# Patient Record
Sex: Female | Born: 1935
Health system: Southern US, Community
[De-identification: ages and names within clinical notes are randomized; demographics above are authoritative.]

## PROBLEM LIST (undated history)

## (undated) DIAGNOSIS — K219 Gastro-esophageal reflux disease without esophagitis: Secondary | ICD-10-CM

## (undated) DIAGNOSIS — I10 Essential (primary) hypertension: Secondary | ICD-10-CM

## (undated) DIAGNOSIS — Z8719 Personal history of other diseases of the digestive system: Secondary | ICD-10-CM

## (undated) DIAGNOSIS — D649 Anemia, unspecified: Secondary | ICD-10-CM

## (undated) DIAGNOSIS — M1990 Unspecified osteoarthritis, unspecified site: Secondary | ICD-10-CM

## (undated) DIAGNOSIS — Z8669 Personal history of other diseases of the nervous system and sense organs: Secondary | ICD-10-CM

## (undated) DIAGNOSIS — C539 Malignant neoplasm of cervix uteri, unspecified: Secondary | ICD-10-CM

## (undated) DIAGNOSIS — I499 Cardiac arrhythmia, unspecified: Secondary | ICD-10-CM

## (undated) DIAGNOSIS — H3530 Unspecified macular degeneration: Secondary | ICD-10-CM

## (undated) DIAGNOSIS — N200 Calculus of kidney: Secondary | ICD-10-CM

## (undated) DIAGNOSIS — E079 Disorder of thyroid, unspecified: Secondary | ICD-10-CM

## (undated) DIAGNOSIS — Z87442 Personal history of urinary calculi: Secondary | ICD-10-CM

## (undated) DIAGNOSIS — N63 Unspecified lump in unspecified breast: Secondary | ICD-10-CM

## (undated) HISTORY — PX: ABDOMINAL HYSTERECTOMY: SHX81

## (undated) HISTORY — DX: Gastro-esophageal reflux disease without esophagitis: K21.9

## (undated) HISTORY — DX: Unspecified lump in unspecified breast: N63.0

## (undated) HISTORY — PX: CHOLECYSTECTOMY: SHX55

## (undated) HISTORY — DX: Disorder of thyroid, unspecified: E07.9

## (undated) HISTORY — DX: Calculus of kidney: N20.0

## (undated) HISTORY — DX: Cardiac arrhythmia, unspecified: I49.9

## (undated) HISTORY — DX: Essential (primary) hypertension: I10

## (undated) HISTORY — PX: SUPRAVENTRICULAR TACHYCARDIA ABLATION: SHX6106

---

## 1997-06-26 DIAGNOSIS — C4492 Squamous cell carcinoma of skin, unspecified: Secondary | ICD-10-CM

## 1997-06-26 HISTORY — DX: Squamous cell carcinoma of skin, unspecified: C44.92

## 1999-03-24 ENCOUNTER — Ambulatory Visit (HOSPITAL_COMMUNITY): Admission: RE | Admit: 1999-03-24 | Discharge: 1999-03-24 | Payer: Self-pay | Admitting: Gastroenterology

## 1999-11-03 ENCOUNTER — Other Ambulatory Visit: Admission: RE | Admit: 1999-11-03 | Discharge: 1999-11-03 | Payer: Self-pay | Admitting: Gynecology

## 2002-01-06 ENCOUNTER — Ambulatory Visit (HOSPITAL_COMMUNITY): Admission: RE | Admit: 2002-01-06 | Discharge: 2002-01-06 | Payer: Self-pay | Admitting: Cardiology

## 2002-01-06 ENCOUNTER — Encounter: Payer: Self-pay | Admitting: Cardiology

## 2003-01-05 ENCOUNTER — Other Ambulatory Visit: Admission: RE | Admit: 2003-01-05 | Discharge: 2003-01-05 | Payer: Self-pay | Admitting: Gynecology

## 2004-03-31 ENCOUNTER — Ambulatory Visit (HOSPITAL_COMMUNITY): Admission: RE | Admit: 2004-03-31 | Discharge: 2004-03-31 | Payer: Self-pay | Admitting: Gastroenterology

## 2008-02-19 HISTORY — PX: NM MYOCAR PERF WALL MOTION: HXRAD629

## 2009-05-19 ENCOUNTER — Encounter: Admission: RE | Admit: 2009-05-19 | Discharge: 2009-05-19 | Payer: Self-pay | Admitting: Endocrinology

## 2010-05-23 ENCOUNTER — Encounter: Admission: RE | Admit: 2010-05-23 | Discharge: 2010-05-23 | Payer: Self-pay | Admitting: Endocrinology

## 2010-11-11 NOTE — Op Note (Signed)
NAMEKORRYN, PANCOAST                ACCOUNT NO.:  [id number]   MEDICAL RECORD NO.:  [id number]          PATIENT TYPE:  AMB   LOCATION:  ENDO                         FACILITY:  [facility]   PHYSICIAN:  James L. Malon Kindle., M.D.DATE OF BIRTH:  10/10/1926   DATE OF PROCEDURE:  03/31/2004  DATE OF DISCHARGE:                                 OPERATIVE REPORT   PROCEDURE:  Colonoscopy.   MEDICATIONS:  Fentanyl 70 mcg, Versed 6 mg IV.   INSTRUMENT USED:  Olympus pediatric adjustable colonoscope.   INDICATIONS FOR PROCEDURE:  Previous family history of colon cancer with  brother having had colon cancer.  This is done as a five-year follow-up  procedure.   DESCRIPTION OF PROCEDURE:  The procedure had been explained to the patient  and consent obtained.  With the patient in the left lateral decubitus  position, the pediatric adjustable scope was inserted and advanced.  The  prep was adequate.  We were able to advance to the cecum using abdominal  pressure and position changes.  The ileocecal valve and appendiceal orifice  were seen.  The scope was withdrawn.  The cecum, ascending colon, transverse  colon, descending and sigmoid colon were seen well.  No polyps were seen  throughout.  Scattered diverticuli in the sigmoid colon.  The rectum was  free of polyps.  The scope was withdrawn.  The patient tolerated the  procedure well and was maintained on low flow oxygen and pulse oximetry  throughout the procedure.   ASSESSMENT:  Family history of colon cancer.  V16.0   PLAN:  Will recommend yearly hemoccults and repeat colonoscopy in five  years.       JLE/MEDQ  D:  03/31/2004  T:  03/31/2004  Job:  [id number]   cc:   Veverly Fells. Altheimer, M.D.  [address], [address]  [place]  [place] [address]  Fax: [phone]

## 2011-02-04 ENCOUNTER — Emergency Department (HOSPITAL_COMMUNITY)
Admission: EM | Admit: 2011-02-04 | Discharge: 2011-02-05 | Disposition: A | Discharge status: Home / Self Care | Payer: Medicare Other | Attending: Emergency Medicine | Admitting: Emergency Medicine

## 2011-02-04 ENCOUNTER — Emergency Department (HOSPITAL_COMMUNITY): Payer: Medicare Other

## 2011-02-04 DIAGNOSIS — Z90710 Acquired absence of both cervix and uterus: Secondary | ICD-10-CM | POA: Insufficient documentation

## 2011-02-04 DIAGNOSIS — K7689 Other specified diseases of liver: Secondary | ICD-10-CM | POA: Insufficient documentation

## 2011-02-04 DIAGNOSIS — N63 Unspecified lump in unspecified breast: Secondary | ICD-10-CM | POA: Insufficient documentation

## 2011-02-04 DIAGNOSIS — N201 Calculus of ureter: Secondary | ICD-10-CM | POA: Insufficient documentation

## 2011-02-04 DIAGNOSIS — Z9089 Acquired absence of other organs: Secondary | ICD-10-CM | POA: Insufficient documentation

## 2011-02-04 DIAGNOSIS — R109 Unspecified abdominal pain: Secondary | ICD-10-CM | POA: Insufficient documentation

## 2011-02-04 DIAGNOSIS — I4891 Unspecified atrial fibrillation: Secondary | ICD-10-CM | POA: Insufficient documentation

## 2011-02-04 DIAGNOSIS — E039 Hypothyroidism, unspecified: Secondary | ICD-10-CM | POA: Insufficient documentation

## 2011-02-04 DIAGNOSIS — IMO0002 Reserved for concepts with insufficient information to code with codable children: Secondary | ICD-10-CM | POA: Insufficient documentation

## 2011-02-04 LAB — CBC
HCT: 35.3 % — ABNORMAL LOW (ref 36.0–46.0)
Hemoglobin: 11 g/dL — ABNORMAL LOW (ref 12.0–15.0)
MCH: 23.2 pg — ABNORMAL LOW (ref 26.0–34.0)
MCHC: 31.2 g/dL (ref 30.0–36.0)
MCV: 74.5 fL — ABNORMAL LOW (ref 78.0–100.0)
Platelets: 277 10*3/uL (ref 150–400)
RBC: 4.74 MIL/uL (ref 3.87–5.11)
RDW: 15.1 % (ref 11.5–15.5)
WBC: 12.7 10*3/uL — ABNORMAL HIGH (ref 4.0–10.5)

## 2011-02-04 LAB — DIFFERENTIAL
Basophils Absolute: 0 10*3/uL (ref 0.0–0.1)
Basophils Relative: 0 % (ref 0–1)
Eosinophils Absolute: 0.1 10*3/uL (ref 0.0–0.7)
Eosinophils Relative: 1 % (ref 0–5)
Lymphocytes Relative: 13 % (ref 12–46)
Lymphs Abs: 1.7 10*3/uL (ref 0.7–4.0)
Monocytes Absolute: 0.8 10*3/uL (ref 0.1–1.0)
Monocytes Relative: 6 % (ref 3–12)
Neutro Abs: 10.1 10*3/uL — ABNORMAL HIGH (ref 1.7–7.7)
Neutrophils Relative %: 80 % — ABNORMAL HIGH (ref 43–77)

## 2011-02-04 LAB — URINE MICROSCOPIC-ADD ON

## 2011-02-04 LAB — BASIC METABOLIC PANEL
BUN: 12 mg/dL (ref 6–23)
CO2: 26 mEq/L (ref 19–32)
Calcium: 9.2 mg/dL (ref 8.4–10.5)
Chloride: 103 mEq/L (ref 96–112)
Creatinine, Ser: 0.9 mg/dL (ref 0.50–1.10)
GFR calc Af Amer: >60 mL/min (ref >60)
GFR calc non Af Amer: >60 mL/min (ref >60)
Glucose, Bld: 148 mg/dL — ABNORMAL HIGH (ref 70–99)
Potassium: 4 mEq/L (ref 3.5–5.1)
Sodium: 139 mEq/L (ref 135–145)

## 2011-02-04 LAB — URINALYSIS, ROUTINE W REFLEX MICROSCOPIC
Bilirubin Urine: NEGATIVE
Glucose, UA: NEGATIVE mg/dL
Leukocytes, UA: NEGATIVE
Nitrite: NEGATIVE
Protein, ur: NEGATIVE mg/dL
Specific Gravity, Urine: 1.021 (ref 1.005–1.030)
Urobilinogen, UA: 0.2 mg/dL (ref 0.0–1.0)
pH: 6 (ref 5.0–8.0)

## 2011-02-04 LAB — LACTIC ACID, PLASMA: Lactic Acid, Venous: 0.9 mmol/L (ref 0.5–2.2)

## 2011-02-04 MED ORDER — IOHEXOL 300 MG/ML  SOLN
100.0000 mL | Freq: Once | INTRAMUSCULAR | Status: AC | PRN
  Administered 2011-02-04: 100 mL via INTRAVENOUS

## 2011-06-27 DIAGNOSIS — C4492 Squamous cell carcinoma of skin, unspecified: Secondary | ICD-10-CM

## 2011-06-27 DIAGNOSIS — I10 Essential (primary) hypertension: Secondary | ICD-10-CM

## 2011-06-27 HISTORY — DX: Squamous cell carcinoma of skin, unspecified: C44.92

## 2011-06-27 HISTORY — DX: Essential (primary) hypertension: I10

## 2012-02-06 DIAGNOSIS — I499 Cardiac arrhythmia, unspecified: Secondary | ICD-10-CM

## 2012-02-06 HISTORY — DX: Cardiac arrhythmia, unspecified: I49.9

## 2012-10-14 ENCOUNTER — Encounter (INDEPENDENT_AMBULATORY_CARE_PROVIDER_SITE_OTHER): Payer: Self-pay | Admitting: Surgery

## 2012-10-21 ENCOUNTER — Encounter (INDEPENDENT_AMBULATORY_CARE_PROVIDER_SITE_OTHER): Payer: Self-pay | Admitting: Surgery

## 2012-10-21 ENCOUNTER — Other Ambulatory Visit (INDEPENDENT_AMBULATORY_CARE_PROVIDER_SITE_OTHER): Payer: Self-pay | Admitting: Surgery

## 2012-10-21 ENCOUNTER — Ambulatory Visit (INDEPENDENT_AMBULATORY_CARE_PROVIDER_SITE_OTHER): Payer: Medicare Other | Admitting: Surgery

## 2012-10-21 VITALS — BP 129/84 | HR 80 | Temp 97.6°F | Resp 16 | Ht 62.0 in | Wt 119.2 lb

## 2012-10-21 DIAGNOSIS — N6310 Unspecified lump in the right breast, unspecified quadrant: Secondary | ICD-10-CM

## 2012-10-21 DIAGNOSIS — N63 Unspecified lump in unspecified breast: Secondary | ICD-10-CM

## 2012-10-21 NOTE — Progress Notes (Signed)
Patient ID: Jill Mcdowell, female   DOB: Feb 22, 1927, 79 y.o.   MRN: [id number]  Chief Complaint  Patient presents with  . Breast Problem   PCP - Dr. Leslie Dales Cardiology Dr. Royann Shivers HPI Jill Mcdowell is a 79 y.o. female.  Referred by Dr. Greta Doom at [facility] for evaluation of large right breast mass HPI This is an 79 year old female who presents with at least a two-year history of a palpable mass behind her right nipple. She has had annual mammograms for the last several years. Her last mammogram was in August of 2013. This noted that the mass in the right retroareolar region had not increased in prominence. Biopsies were never performed of this area.  The mass has gradually enlarged until now it is about 4-5 cm across. Several weeks ago this became very tender and painful to touch. There is no erythema noted. No drainage from the nipples. No palpable lymphadenopathy.  Past Medical History  Diagnosis Date  . Breast lump   . Thyroid disease   . GERD (gastroesophageal reflux disease)   . CHF (congestive heart failure)     Past Surgical History  Procedure Laterality Date  . Abdominal hysterectomy    . Coronary artery bypass graft    . Supraventricular tachycardia ablation      No family history on file.  Social History History  Substance Use Topics  . Smoking status: Never Smoker   . Smokeless tobacco: Not on file  . Alcohol Use: No    Allergies  Allergen Reactions  . Sulfa Antibiotics Hives    Current Outpatient Prescriptions  Medication Sig Dispense Refill  . CHLORPHENIRAMINE-DM PO Take by mouth.      . ciprofloxacin (CIPRO) 250 MG tablet Take 250 mg by mouth 2 (two) times daily.      . digoxin (LANOXIN) 0.125 MG tablet Take 0.125 mg by mouth daily.      [name] diltiazem (DILACOR XR) 180 MG 24 hr capsule Take 180 mg by mouth daily.      . Hydrocodone-Chlorpheniramine (CHLORPHENIRAMINE-HYDROCODONE PO) Take by mouth.      . iron polysaccharides (NIFEREX) 150 MG  capsule Take 150 mg by mouth 2 (two) times daily.      [name] levothyroxine (SYNTHROID, LEVOTHROID) 50 MCG tablet Take 50 mcg by mouth daily before breakfast.      . omeprazole (PRILOSEC) 20 MG capsule Take 20 mg by mouth daily.       No current facility-administered medications for this visit.    Review of Systems Review of Systems  Constitutional: Negative for fever, chills and unexpected weight change.  HENT: Negative for hearing loss, congestion, sore throat, trouble swallowing and voice change.   Eyes: Negative for visual disturbance.  Respiratory: Negative for cough and wheezing.   Cardiovascular: Negative for chest pain, palpitations and leg swelling.  Gastrointestinal: Negative for nausea, vomiting, abdominal pain, diarrhea, constipation, blood in stool, abdominal distention and anal bleeding.  Genitourinary: Negative for hematuria, vaginal bleeding and difficulty urinating.  Musculoskeletal: Negative for arthralgias.  Skin: Negative for rash and wound.  Neurological: Negative for seizures, syncope and headaches.  Hematological: Negative for adenopathy. Does not bruise/bleed easily.  Psychiatric/Behavioral: Negative for confusion.    Blood pressure 129/84, pulse 80, temperature 97.6 F (36.4 C), temperature source Temporal, resp. rate 16, height 5\' 2"  (1.575 m), weight 119 lb 3.2 oz (54.069 kg).  Physical Exam Physical Exam Thin elderly female in NAD HEENT:  EOMI, sclera anicteric Neck:  No masses, no  thyromegaly Lungs:  CTA bilaterally; normal respiratory effort Breasts - no palpable masses in the left breast; no axillary lymphadenopathy on the left Right breast - large 4-5 cm firm retroareolar breast mass - protruding; does not appear fixed to the chest wall; no axillary lymphadenopathy CV:  Regular rate and rhythm; no murmurs Abd:  +bowel sounds, soft, non-tender, no masses Ext:  Well-perfused; no edema Skin:  Warm, dry; no sign of jaundice  Data Reviewed Mammogram  02/21/12 from [facility] - Stable BIRADS 2  Assessment    Breast mass is highly worrisome for invasive carcinoma.       Plan    Immediate ultrasound and core needle biopsy of the mass for diagnosis.  May require adjuvant therapy with hormonal therapy vs. Mastectomy.  We will also obtain cardiac clearance in the event that she needs surgery.  We will see her back in 4 days to discuss the findings of the biopsy and to plan her treatment.         Janki Dike K. 10/21/2012, 5:00 PM

## 2012-10-22 ENCOUNTER — Encounter (INDEPENDENT_AMBULATORY_CARE_PROVIDER_SITE_OTHER): Payer: Self-pay | Admitting: General Surgery

## 2012-10-23 ENCOUNTER — Telehealth (INDEPENDENT_AMBULATORY_CARE_PROVIDER_SITE_OTHER): Payer: Self-pay | Admitting: General Surgery

## 2012-10-23 NOTE — Telephone Encounter (Signed)
Called patient to let her know about her cytopathology report was normal that it showed only a cystic content, no malignant cells. And I also called and canceled her upcoming apt with Dr Rennis Golden. Patient is aware that she needed to keep her apt with Dr Corliss Skains on 10-25-12

## 2012-10-25 ENCOUNTER — Ambulatory Visit (INDEPENDENT_AMBULATORY_CARE_PROVIDER_SITE_OTHER): Payer: Medicare Other | Admitting: Surgery

## 2012-10-25 ENCOUNTER — Encounter (INDEPENDENT_AMBULATORY_CARE_PROVIDER_SITE_OTHER): Payer: Self-pay | Admitting: Surgery

## 2012-10-25 VITALS — BP 122/64 | HR 60 | Temp 97.8°F | Resp 18 | Ht 62.0 in | Wt 120.0 lb

## 2012-10-25 DIAGNOSIS — N6009 Solitary cyst of unspecified breast: Secondary | ICD-10-CM | POA: Insufficient documentation

## 2012-10-25 DIAGNOSIS — N6001 Solitary cyst of right breast: Secondary | ICD-10-CM

## 2012-10-25 NOTE — Progress Notes (Signed)
The patient is seen after her recent diagnostic mammograms/ ultrasound/ cyst aspiration.  We were pleasantly surprised to find that the hard painful mass was a large complex cyst.  This was aspirated with immediate resolution of her symptoms.  Cytology was negative for malignancy.  The question remain as to why she suddenly had an enlargement of the mass.  She has a follow-up ultrasound in October.  The breast mass is essentially resolved.  The aspiration site is healing well.  No breast tenderness.  Repeat ultrasound October. Follow-up PRN.  Jill Mcdowell. Jill Skains, MD, [facility]  10/25/2012 5:06 PM

## 2012-11-04 ENCOUNTER — Encounter (INDEPENDENT_AMBULATORY_CARE_PROVIDER_SITE_OTHER): Payer: Self-pay

## 2012-11-08 ENCOUNTER — Encounter (INDEPENDENT_AMBULATORY_CARE_PROVIDER_SITE_OTHER): Payer: Medicare Other | Admitting: Surgery

## 2012-11-22 ENCOUNTER — Encounter (INDEPENDENT_AMBULATORY_CARE_PROVIDER_SITE_OTHER): Payer: Self-pay

## 2013-01-23 ENCOUNTER — Other Ambulatory Visit: Payer: Self-pay | Admitting: *Deleted

## 2013-01-23 MED ORDER — DILTIAZEM HCL ER 180 MG PO CP24: 180.0000 mg | ORAL_CAPSULE | Freq: Every day | ORAL | Status: DC

## 2013-01-23 MED ORDER — DIGOXIN 125 MCG PO TABS: 0.1250 mg | ORAL_TABLET | Freq: Every day | ORAL | Status: DC

## 2013-01-23 NOTE — Telephone Encounter (Signed)
Rx was sent to pharmacy electronically. 

## 2013-02-20 ENCOUNTER — Encounter: Payer: Self-pay | Admitting: *Deleted

## 2013-02-21 ENCOUNTER — Encounter: Payer: Self-pay | Admitting: Cardiovascular Disease

## 2013-02-26 ENCOUNTER — Encounter: Payer: Self-pay | Admitting: Cardiovascular Disease

## 2013-02-26 ENCOUNTER — Ambulatory Visit (INDEPENDENT_AMBULATORY_CARE_PROVIDER_SITE_OTHER): Payer: Medicare Other | Admitting: Cardiovascular Disease

## 2013-02-26 VITALS — BP 120/56 | HR 74 | Resp 16 | Ht 62.0 in | Wt 119.1 lb

## 2013-02-26 DIAGNOSIS — I4891 Unspecified atrial fibrillation: Secondary | ICD-10-CM

## 2013-02-26 DIAGNOSIS — I499 Cardiac arrhythmia, unspecified: Secondary | ICD-10-CM

## 2013-02-26 DIAGNOSIS — I480 Paroxysmal atrial fibrillation: Secondary | ICD-10-CM

## 2013-02-26 NOTE — Assessment & Plan Note (Signed)
Pelvis shows are brief, fairly infrequent and are minimally symptomatic. In fact atrial fibrillation has not been documented electrocardiographically in years. Antiarrhythmic therapy does not appear to be justified. Her CHADS2Vasc2 score is low, since her only risk factor is her age. Aspirin is satisfactory prevention therapy for embolism. She really does not have hypertension. The diltiazem she takes is primarily indicated for prevention of rapid ventricular rates. No changes are recommended her medications today. She'll continue followup on a yearly basis.

## 2013-02-26 NOTE — Patient Instructions (Addendum)
Your physician recommends that you schedule a follow-up appointment in: 12 months.  

## 2013-02-26 NOTE — Progress Notes (Signed)
Patient ID: Jill Mcdowell, female   DOB: [birth year], 79 y.o.   MRN: [id number]     Reason for office visit Atrial fibrillation, palpitations  This is my first official encounter with Jill Mcdowell, whose previous cardiologist is Dr. Caprice Kluver until his recent retirement. Her husband Jill Mcdowell is also my patient and I have met her before during his office appointments.  Jill Mcdowell is very active and fit and has few medical problems. She had recent breast biopsy that showed benign findings. Many years ago she had an episode of atrial fibrillation. She is now troubled by occasional very brief palpitations with abrupt onset and termination, most commonly when she first gets up in the morning. They are not associated with chest pain, dyspnea or syncope. Many times she has just isolated skipped beats. She denies any other complaints.  She reportedly had mitral valve prolapse identified by echocardiography many years ago. This diagnosis is not evident by physical exam. She had a normal nuclear stress test about 5 years ago.    Allergies  Allergen Reactions  . Latex     Skin irritation  . Sulfa Antibiotics Hives    Current Outpatient Prescriptions  Medication Sig Dispense Refill  . aspirin EC 81 MG tablet Take 81 mg by mouth daily.      . calcium carbonate (TUMS EX) 750 MG chewable tablet Chew 1 tablet by mouth daily.      . cholecalciferol (VITAMIN D) 1000 UNITS tablet Take 1,000 Units by mouth daily.      . digoxin (LANOXIN) 0.125 MG tablet Take 1 tablet (0.125 mg total) by mouth daily.  90 tablet  0  . diltiazem (DILACOR XR) 180 MG 24 hr capsule Take 1 capsule (180 mg total) by mouth daily.  90 capsule  0  . Hydrocodone-Chlorpheniramine (CHLORPHENIRAMINE-HYDROCODONE PO) Take by mouth as needed.       . iron polysaccharides (NIFEREX) 150 MG capsule Take 150 mg by mouth 2 (two) times a week.       . levothyroxine (SYNTHROID, LEVOTHROID) 50 MCG tablet Take 50 mcg by mouth daily before breakfast.      .  Multiple Vitamins-Minerals (PRESERVISION AREDS PO) Take by mouth.      [name] omeprazole (PRILOSEC) 20 MG capsule Take 20 mg by mouth daily.       No current facility-administered medications for this visit.    Past Medical History  Diagnosis Date  . Breast lump   . Thyroid disease   . GERD (gastroesophageal reflux disease)   . CHF (congestive heart failure)   . Arrhythmia 02/06/2012    hx of paroxysmal a fib with no recurrence,rare palpations  . Hypertension 2013    good control  . Kidney stones     history of    Past Surgical History  Procedure Laterality Date  . Abdominal hysterectomy    . Coronary artery bypass graft    . Supraventricular tachycardia ablation    . Nm myocar perf wall motion  Feb 19 2008    negative for ischemia    Family History  Problem Relation Age of Onset  . Cancer Mother   . Stroke Father   . Cancer Brother     History   Social History  . Marital Status: Married    Spouse Name: N/A    Number of Children: N/A  . Years of Education: N/A   Occupational History  . Not on file.   Social History Main Topics  . Smoking status: Never Smoker   .  Smokeless tobacco: Not on file  . Alcohol Use: No  . Drug Use: No  . Sexual Activity: Not on file   Other Topics Concern  . Not on file   Social History Narrative  . No narrative on file    Review of systems: The patient specifically denies any chest pain at rest or with exertion, dyspnea at rest or with exertion, orthopnea, paroxysmal nocturnal dyspnea, syncope, focal neurological deficits, intermittent claudication, lower extremity edema, unexplained weight gain, cough, hemoptysis or wheezing.  The patient also denies abdominal pain, nausea, vomiting, dysphagia, diarrhea, constipation, polyuria, polydipsia, dysuria, hematuria, frequency, urgency, abnormal bleeding or bruising, fever, chills, unexpected weight changes, mood swings, change in skin or hair texture, change in voice quality, auditory or  visual problems, allergic reactions or rashes, new musculoskeletal complaints other than usual "aches and pains".   PHYSICAL EXAM BP 120/56  Pulse 74  Resp 16  Ht 5\' 2"  (1.575 m)  Wt 119 lb 1.6 oz (54.023 kg)  BMI 21.78 kg/m2  General: Alert, oriented x3, no distress Head: no evidence of trauma, PERRL, EOMI, no exophtalmos or lid lag, no myxedema, no xanthelasma; normal ears, nose and oropharynx Neck: normal jugular venous pulsations and no hepatojugular reflux; brisk carotid pulses without delay and no carotid bruits Chest: clear to auscultation, no signs of consolidation by percussion or palpation, normal fremitus, symmetrical and full respiratory excursions Cardiovascular: normal position and quality of the apical impulse, regular rhythm, normal first and second heart sounds, no murmurs, rubs or gallops Abdomen: no tenderness or distention, no masses by palpation, no abnormal pulsatility or arterial bruits, normal bowel sounds, no hepatosplenomegaly Extremities: no clubbing, cyanosis or edema; 2+ radial, ulnar and brachial pulses bilaterally; 2+ right femoral, posterior tibial and dorsalis pedis pulses; 2+ left femoral, posterior tibial and dorsalis pedis pulses; no subclavian or femoral bruits Neurological: grossly nonfocal   EKG: NSR, normal  Lipid Panel  No results found for this basename: chol, trig, hdl, cholhdl, vldl, ldlcalc    BMET    Component Value Date/Time   NA 139 02/04/2011 1903   K 4.0 02/04/2011 1903   CL 103 02/04/2011 1903   CO2 26 02/04/2011 1903   GLUCOSE 148* 02/04/2011 1903   BUN 12 02/04/2011 1903   CREATININE 0.90 02/04/2011 1903   CALCIUM 9.2 02/04/2011 1903   GFRNONAA >60 02/04/2011 1903   GFRAA >60 02/04/2011 1903     ASSESSMENT AND PLAN Paroxysmal atrial fibrillation Pelvis shows are brief, fairly infrequent and are minimally symptomatic. In fact atrial fibrillation has not been documented electrocardiographically in years. Antiarrhythmic therapy does  not appear to be justified. Her CHADS2Vasc2 score is low, since her only risk factor is her age. Aspirin is satisfactory prevention therapy for embolism. She really does not have hypertension. The diltiazem she takes is primarily indicated for prevention of rapid ventricular rates. No changes are recommended her medications today. She'll continue followup on a yearly basis.  Orders Placed This Encounter  Procedures  . EKG 12-Lead   No orders of the defined types were placed in this encounter.    Junious Silk, MD, [facility] [phone] office [phone] pager

## 2013-04-16 ENCOUNTER — Telehealth (INDEPENDENT_AMBULATORY_CARE_PROVIDER_SITE_OTHER): Payer: Self-pay | Admitting: General Surgery

## 2013-04-16 NOTE — Telephone Encounter (Signed)
Faxed back orders to [facility] for patient to have her Right Breast Ultrasound. Dr Corliss Skains sign and will be scan in epic

## 2013-04-29 ENCOUNTER — Encounter (INDEPENDENT_AMBULATORY_CARE_PROVIDER_SITE_OTHER): Payer: Self-pay

## 2013-05-05 ENCOUNTER — Other Ambulatory Visit: Payer: Self-pay

## 2013-05-05 MED ORDER — DIGOXIN 125 MCG PO TABS: 0.1250 mg | ORAL_TABLET | Freq: Every day | ORAL | Status: DC

## 2013-05-05 MED ORDER — DILTIAZEM HCL ER 180 MG PO CP24: 180.0000 mg | ORAL_CAPSULE | Freq: Every day | ORAL | Status: DC

## 2013-05-05 NOTE — Telephone Encounter (Signed)
Rx(s) were sent to pharmacy electronically. 

## 2013-07-08 ENCOUNTER — Encounter: Payer: Self-pay | Admitting: Cardiovascular Disease

## 2013-07-08 ENCOUNTER — Ambulatory Visit (INDEPENDENT_AMBULATORY_CARE_PROVIDER_SITE_OTHER): Payer: Medicare Other | Admitting: Cardiovascular Disease

## 2013-07-08 VITALS — BP 124/60 | HR 70 | Resp 20 | Ht 62.0 in | Wt 117.5 lb

## 2013-07-08 DIAGNOSIS — I4891 Unspecified atrial fibrillation: Secondary | ICD-10-CM

## 2013-07-08 DIAGNOSIS — I480 Paroxysmal atrial fibrillation: Secondary | ICD-10-CM

## 2013-07-08 NOTE — Patient Instructions (Signed)
Your physician recommends that you schedule a follow-up appointment in: 1 year follow up

## 2013-07-11 ENCOUNTER — Encounter: Payer: Self-pay | Admitting: Cardiovascular Disease

## 2013-07-11 NOTE — Progress Notes (Signed)
Patient ID: Jill Mcdowell, female   DOB: 06/04/1927, 80 y.o.   MRN: [id number]      Reason for office visit History of atrial fibrillation.  Jill Mcdowell has done well since her last appointment and has rarely been troubled by palpitations. She remains active and fit. When she does have palpitations they are brief and resolve spontaneously within a few seconds. The exception would be around Christmastime when she thinks she overindulgent chocolate and had fairly frequent skipped beats. It occurred roughly 4 times during a three-week period. They do not associate angina, dyspnea, syncope or other cardiovascular complaints. She feels that they're just isolated skipped beats. She bears a diagnosis of mitral valve prolapse and had a normal nuclear stress test about 5 years ago. She has preserved left ventricular systolic function.   Allergies  Allergen Reactions  . Latex     Skin irritation  . Sulfa Antibiotics Hives    Current Outpatient Prescriptions  Medication Sig Dispense Refill  . aspirin EC 81 MG tablet Take 81 mg by mouth daily.      . calcium carbonate (TUMS EX) 750 MG chewable tablet Chew 1 tablet by mouth daily.      . cholecalciferol (VITAMIN D) 1000 UNITS tablet Take 1,000 Units by mouth daily.      . digoxin (LANOXIN) 0.125 MG tablet Take 1 tablet (0.125 mg total) by mouth daily.  90 tablet  3  . diltiazem (DILACOR XR) 180 MG 24 hr capsule Take 1 capsule (180 mg total) by mouth daily.  90 capsule  3  . Hydrocodone-Chlorpheniramine (CHLORPHENIRAMINE-HYDROCODONE PO) Take by mouth as needed.       . iron polysaccharides (NIFEREX) 150 MG capsule Take 150 mg by mouth 2 (two) times a week.       . levothyroxine (SYNTHROID, LEVOTHROID) 50 MCG tablet Take 50 mcg by mouth daily before breakfast.      . Multiple Vitamins-Minerals (PRESERVISION AREDS PO) Take by mouth.      [name] omeprazole (PRILOSEC) 20 MG capsule Take 20 mg by mouth daily.       No current facility-administered medications  for this visit.    Past Medical History  Diagnosis Date  . Breast lump   . Thyroid disease   . GERD (gastroesophageal reflux disease)   . CHF (congestive heart failure)   . Arrhythmia 02/06/2012    hx of paroxysmal a fib with no recurrence,rare palpations  . Hypertension 2013    good control  . Kidney stones     history of    Past Surgical History  Procedure Laterality Date  . Abdominal hysterectomy    . Coronary artery bypass graft    . Supraventricular tachycardia ablation    . Nm myocar perf wall motion  Feb 19 2008    negative for ischemia    Family History  Problem Relation Age of Onset  . Cancer Mother   . Stroke Father   . Cancer Brother     History   Social History  . Marital Status: Married    Spouse Name: N/A    Number of Children: N/A  . Years of Education: N/A   Occupational History  . Not on file.   Social History Main Topics  . Smoking status: Never Smoker   . Smokeless tobacco: Not on file  . Alcohol Use: No  . Drug Use: No  . Sexual Activity: Not on file   Other Topics Concern  . Not on file  Social History Narrative  . No narrative on file    Review of systems: The patient specifically denies any chest pain at rest or with exertion, dyspnea at rest or with exertion, orthopnea, paroxysmal nocturnal dyspnea, syncope, focal neurological deficits, intermittent claudication, lower extremity edema, unexplained weight gain, cough, hemoptysis or wheezing.  The patient also denies abdominal pain, nausea, vomiting, dysphagia, diarrhea, constipation, polyuria, polydipsia, dysuria, hematuria, frequency, urgency, abnormal bleeding or bruising, fever, chills, unexpected weight changes, mood swings, change in skin or hair texture, change in voice quality, auditory or visual problems, allergic reactions or rashes, new musculoskeletal complaints other than usual "aches and pains".   PHYSICAL EXAM BP 124/60  Pulse 70  Resp 20  Ht 5\' 2"  (1.575 m)  Wt  53.298 kg (117 lb 8 oz)  BMI 21.49 kg/m2 General: Alert, oriented x3, no distress  Head: no evidence of trauma, PERRL, EOMI, no exophtalmos or lid lag, no myxedema, no xanthelasma; normal ears, nose and oropharynx  Neck: normal jugular venous pulsations and no hepatojugular reflux; brisk carotid pulses without delay and no carotid bruits  Chest: clear to auscultation, no signs of consolidation by percussion or palpation, normal fremitus, symmetrical and full respiratory excursions  Cardiovascular: normal position and quality of the apical impulse, regular rhythm, normal first and second heart sounds, no murmurs, rubs or gallops  Abdomen: no tenderness or distention, no masses by palpation, no abnormal pulsatility or arterial bruits, normal bowel sounds, no hepatosplenomegaly  Extremities: no clubbing, cyanosis or edema; 2+ radial, ulnar and brachial pulses bilaterally; 2+ right femoral, posterior tibial and dorsalis pedis pulses; 2+ left femoral, posterior tibial and dorsalis pedis pulses; no subclavian or femoral bruits  Neurological: grossly nonfocal      EKG: NSR  BMET    Component Value Date/Time   NA 139 02/04/2011 1903   K 4.0 02/04/2011 1903   CL 103 02/04/2011 1903   CO2 26 02/04/2011 1903   GLUCOSE 148* 02/04/2011 1903   BUN 12 02/04/2011 1903   CREATININE 0.90 02/04/2011 1903   CALCIUM 9.2 02/04/2011 1903   GFRNONAA >60 02/04/2011 1903   GFRAA >60 02/04/2011 1903     ASSESSMENT AND PLAN Paroxysmal atrial fibrillation It has been many years since atrial fibrillation has been firmly documented and her most recent palpitations sound like brief spells of PACs and/or atrial tachycardia lasting only several seconds every time. On the other hand, we cannot be sure that she is not having paroxysmal atrial fibrillation. Her symptoms are relatively mild and have not subsided after the holidays have passed. Antiarrhythmic therapy or change in her rate control medications does not appear to be  indicated.  We spent quite some time discussing the pros and cons of potent anti-coagulation for stroke prevention. Technically with a CHADS2Vasc2 score of 3, she would meet criteria for anti-coagulation. However when taking into account her HAS BLED score, the benefit of anticoagulants for stroke prevention is more than counterbalanced by the increased risk of bleeding. In addition she only receives points on our scoring system because of her age and gender and she does not have any other risk factors. She is biologically younger than her chronological age. Statistically, aspirin appears to offer the best balance between stroke prevention and bleeding risk. She'll take a minimum of 150 mg of aspirin every day. No other changes are made her medications to   Orders Placed This Encounter  Procedures  . EKG 12-Lead   No orders of the defined types were placed in  this encounter.    Holli Humbles, MD, [place] [phone] office [phone] pager

## 2013-07-11 NOTE — Assessment & Plan Note (Signed)
It has been many years since atrial fibrillation has been firmly documented and her most recent palpitations sound like brief spells of PACs and/or atrial tachycardia lasting only several seconds every time. On the other hand, we cannot be sure that she is not having paroxysmal atrial fibrillation. Her symptoms are relatively mild and have not subsided after the holidays have passed. Antiarrhythmic therapy or change in her rate control medications does not appear to be indicated.  We spent quite some time discussing the pros and cons of potent anti-coagulation for stroke prevention. Technically with a CHADS2Vasc2 score of 3, she would meet criteria for anti-coagulation. However when taking into account her HAS BLED score, the benefit of anticoagulants for stroke prevention is more than counterbalanced by the increased risk of bleeding. In addition she only receives points on our scoring system because of her age and gender and she does not have any other risk factors. She is biologically younger than her chronological age. Statistically, aspirin appears to offer the best balance between stroke prevention and bleeding risk. She'll take a minimum of 150 mg of aspirin every day. No other changes are made her medications to

## 2013-07-14 ENCOUNTER — Encounter: Payer: Self-pay | Admitting: Cardiovascular Disease

## 2013-10-10 ENCOUNTER — Encounter: Payer: Medicare Other | Admitting: *Deleted

## 2013-10-10 ENCOUNTER — Ambulatory Visit (INDEPENDENT_AMBULATORY_CARE_PROVIDER_SITE_OTHER): Payer: Medicare Other | Admitting: *Deleted

## 2013-10-10 ENCOUNTER — Telehealth: Payer: Self-pay | Admitting: *Deleted

## 2013-10-10 DIAGNOSIS — R42 Dizziness and giddiness: Secondary | ICD-10-CM

## 2013-10-10 DIAGNOSIS — R55 Syncope and collapse: Secondary | ICD-10-CM

## 2013-10-10 DIAGNOSIS — R002 Palpitations: Secondary | ICD-10-CM

## 2013-10-10 NOTE — Telephone Encounter (Signed)
C/O near sycopal spell Wednesday.  States Dr. Loletha Grayer had talked about an Event Monitor at her last visit.  She is ready to have this done.  Will place today.

## 2013-10-10 NOTE — Patient Instructions (Signed)
Instruction given to Jill Mcdowell  ANY QUESTION CONCERNING CARDIONET -CALL THE [phone] NUMBER  Your physician recommends that you schedule a follow-up appointment in [place].

## 2013-10-10 NOTE — Progress Notes (Signed)
Monitor for 30 days per Dr Bayard Beaver , near syncope  Jill Mcdowell stated that she is allergic to latex , sometimes the electrode leaves a mark on the her skin. RN informed patient that electrode are not latex but not sure if he will have any trouble. RN informed Dr Sallyanne Kuster.  Per Dr Sallyanne Kuster ,if patient does not want to wear the monitor--next step will be an implanted loop recorder. RN informed patient of the options. Jill Mcdowell STATED SHE WOULD LIKE TO TRY TO WEAR THE MONITOR.  Instruction given to patient concerning how to wear and use the monitor.

## 2013-10-10 NOTE — Progress Notes (Signed)
Open in error

## 2013-10-27 ENCOUNTER — Telehealth: Payer: Self-pay | Admitting: *Deleted

## 2013-10-27 NOTE — Telephone Encounter (Signed)
Returned call and pt verified x 2.  Pt stated she has been wearing the monitor for 2.5 weeks the electrodes are breaking her out and her skin is irritated and red.  Stated she has been using the sensitive skin electrodes.  Pt wants to know if she can stop wearing the monitor.  Wanted to know if Dr. Loletha Grayer has enough data.  Pt informed Dr. Loletha Grayer will be notified for further instructions.  Pt was advised to take electrodes off, take otc antihistamine and apply hydrocortisone cream to rash.  Pt verbalized understanding and will wait for response from Dr. Loletha Grayer about dc'ing monitor.  Message forwarded to Dr. Sallyanne Kuster.

## 2013-10-27 NOTE — Telephone Encounter (Signed)
Pt was calling in regards to her heart monitor that she is wearing. She has some questions regarding it.  [place]

## 2013-10-28 ENCOUNTER — Telehealth: Payer: Self-pay | Admitting: *Deleted

## 2013-10-28 NOTE — Telephone Encounter (Signed)
[employer] notified patient will be sending her monitor back early due to skin irritation from electrodes.  She did not have a MCOT due to insurance reasons therefore strips were only recorded and sent if she was symptomatic or episodes met criteria.  They will do an end of summary report and send it via fax.

## 2013-10-28 NOTE — Telephone Encounter (Signed)
Patient notified to disconnect the Event Monitor and mail it back in since she is breaking out so bad from the electrodes. Patient  voiced understanding.

## 2013-10-28 NOTE — Telephone Encounter (Signed)
Message copied by Tressa Busman on Tue Oct 28, 2013 12:53 PM ------      Message from: Sanda Klein      Created: Tue Oct 28, 2013  8:01 AM       Pamala Hurry, can you pull together the strips we have so far, please.Tell her to stop wearing the minitor, send it back.      [facility] ------

## 2013-11-14 ENCOUNTER — Ambulatory Visit (INDEPENDENT_AMBULATORY_CARE_PROVIDER_SITE_OTHER): Payer: Medicare Other | Admitting: Cardiovascular Disease

## 2013-11-14 ENCOUNTER — Encounter: Payer: Self-pay | Admitting: Cardiovascular Disease

## 2013-11-14 VITALS — BP 138/52 | HR 80 | Resp 15 | Ht 62.0 in | Wt 120.4 lb

## 2013-11-14 DIAGNOSIS — I4891 Unspecified atrial fibrillation: Secondary | ICD-10-CM

## 2013-11-14 DIAGNOSIS — R002 Palpitations: Secondary | ICD-10-CM

## 2013-11-14 DIAGNOSIS — I480 Paroxysmal atrial fibrillation: Secondary | ICD-10-CM

## 2013-11-14 DIAGNOSIS — R55 Syncope and collapse: Secondary | ICD-10-CM

## 2013-11-14 NOTE — Patient Instructions (Signed)
Dr Croitoru wants you to follow-up in 1 year. You will receive a reminder letter in the mail one months in advance. If you don't receive a letter, please call our office to schedule the follow-up appointment. 

## 2013-11-17 ENCOUNTER — Encounter: Payer: Self-pay | Admitting: Cardiovascular Disease

## 2013-11-17 DIAGNOSIS — R002 Palpitations: Secondary | ICD-10-CM | POA: Insufficient documentation

## 2013-11-17 DIAGNOSIS — R55 Syncope and collapse: Secondary | ICD-10-CM | POA: Insufficient documentation

## 2013-11-17 NOTE — Progress Notes (Signed)
Patient ID: ONESTI BONFIGLIO, female   DOB: [birth year], 80 y.o.   MRN: [id number]      Reason for office visit Palpitations, possible history of atrial fibrillation.  Tanicka developed some palpitations and a near syncopal event in April. She tried to wear an event monitor. She was only able to wear it for roughly 2 weeks because she developed blistering rash that the electrode sites. During that time the only abnormalities seen were occasional PVCs. There was no evidence of atrial fibrillation. There was no associated angina, dyspnea, syncope.   She bears a diagnosis of mitral valve prolapse and had a normal nuclear stress test about 5 years ago. She has preserved left ventricular systolic function. It has been many years since she had firm documentation of atrial fibrillation. Technically with a CHADS2Vasc2 score of 3, she would meet criteria for anti-coagulation. However when taking into account her HAS BLED score, the benefit of anticoagulants for stroke prevention is more than counterbalanced by the increased risk of bleeding. In addition she only receives points on our scoring system because of her age and gender and she does not have any other risk factors. She is biologically younger than her chronological age.   Allergies  Allergen Reactions  . Latex     Skin irritation  . Sulfa Antibiotics Hives    Current Outpatient Prescriptions  Medication Sig Dispense Refill  . aspirin EC 81 MG tablet Take 162 mg by mouth daily.       . calcium carbonate (TUMS EX) 750 MG chewable tablet Chew 2-3 tablets by mouth daily.       . Cholecalciferol (VITAMIN D-3) 5000 UNITS TABS Take 10,000 tablets by mouth every 7 (seven) days.      . digoxin (LANOXIN) 0.125 MG tablet Take 1 tablet (0.125 mg total) by mouth daily.  90 tablet  3  . diltiazem (DILACOR XR) 180 MG 24 hr capsule Take 1 capsule (180 mg total) by mouth daily.  90 capsule  3  . Hydrocodone-Chlorpheniramine (CHLORPHENIRAMINE-HYDROCODONE PO) Take  by mouth as needed.       . iron polysaccharides (NIFEREX) 150 MG capsule Take 150 mg by mouth 2 (two) times a week.       . levothyroxine (SYNTHROID, LEVOTHROID) 50 MCG tablet Take 50 mcg by mouth daily before breakfast.      . Multiple Vitamins-Minerals (PRESERVISION AREDS PO) Take by mouth.      [name] omeprazole (PRILOSEC) 20 MG capsule Take 20 mg by mouth daily.       No current facility-administered medications for this visit.    Past Medical History  Diagnosis Date  . Breast lump   . Thyroid disease   . GERD (gastroesophageal reflux disease)   . CHF (congestive heart failure)   . Arrhythmia 02/06/2012    hx of paroxysmal a fib with no recurrence,rare palpations  . Hypertension 2013    good control  . Kidney stones     history of    Past Surgical History  Procedure Laterality Date  . Abdominal hysterectomy    . Coronary artery bypass graft    . Supraventricular tachycardia ablation    . Nm myocar perf wall motion  Feb 19 2008    negative for ischemia    Family History  Problem Relation Age of Onset  . Cancer Mother   . Stroke Father   . Cancer Brother     History   Social History  . Marital Status: Married  Spouse Name: N/A    Number of Children: N/A  . Years of Education: N/A   Occupational History  . Not on file.   Social History Main Topics  . Smoking status: Never Smoker   . Smokeless tobacco: Not on file  . Alcohol Use: No  . Drug Use: No  . Sexual Activity: Not on file   Other Topics Concern  . Not on file   Social History Narrative  . No narrative on file    Review of systems: The patient specifically denies any chest pain at rest or with exertion, dyspnea at rest or with exertion, orthopnea, paroxysmal nocturnal dyspnea, syncope, focal neurological deficits, intermittent claudication, lower extremity edema, unexplained weight gain, cough, hemoptysis or wheezing.  The patient also denies abdominal pain, nausea, vomiting, dysphagia, diarrhea,  constipation, polyuria, polydipsia, dysuria, hematuria, frequency, urgency, abnormal bleeding or bruising, fever, chills, unexpected weight changes, mood swings, change in skin or hair texture, change in voice quality, auditory or visual problems, allergic reactions or rashes, new musculoskeletal complaints other than usual "aches and pains".   PHYSICAL EXAM BP 138/52  Pulse 80  Resp 15  Ht 5\' 2"  (1.575 m)  Wt 120 lb 6.4 oz (54.613 kg)  BMI 22.02 kg/m2 General: Alert, oriented x3, no distress  Head: no evidence of trauma, PERRL, EOMI, no exophtalmos or lid lag, no myxedema, no xanthelasma; normal ears, nose and oropharynx  Neck: normal jugular venous pulsations and no hepatojugular reflux; brisk carotid pulses without delay and no carotid bruits  Chest: clear to auscultation, no signs of consolidation by percussion or palpation, normal fremitus, symmetrical and full respiratory excursions  Cardiovascular: normal position and quality of the apical impulse, regular rhythm, normal first and second heart sounds, no murmurs, rubs or gallops  Abdomen: no tenderness or distention, no masses by palpation, no abnormal pulsatility or arterial bruits, normal bowel sounds, no hepatosplenomegaly  Extremities: no clubbing, cyanosis or edema; 2+ radial, ulnar and brachial pulses bilaterally; 2+ right femoral, posterior tibial and dorsalis pedis pulses; 2+ left femoral, posterior tibial and dorsalis pedis pulses; no subclavian or femoral bruits  Neurological: grossly nonfocal    EKG: NSR  BMET    Component Value Date/Time   NA 139 02/04/2011 1903   K 4.0 02/04/2011 1903   CL 103 02/04/2011 1903   CO2 26 02/04/2011 1903   GLUCOSE 148* 02/04/2011 1903   BUN 12 02/04/2011 1903   CREATININE 0.90 02/04/2011 1903   CALCIUM 9.2 02/04/2011 1903   GFRNONAA >60 02/04/2011 1903   GFRAA >60 02/04/2011 1903     ASSESSMENT AND PLAN  I think the best solution for arrhythmia diagnosis in Mrs. Broadnax's case would be an  implantable loop recorder. At this point she finds this procedure too invasive since her symptoms are not so severe. If she does again develop syncope or near-syncope or if there is renewed concern about atrial fibrillation, I would strongly recommend implantation of a Med[employer]onic Linq device.  Patient Instructions  Dr Sallyanne Kuster wants you to follow-up in 1 year. You will receive a reminder letter in the mail one months in advance. If you don't receive a letter, please call our office to schedule the follow-up appointment.    Meds ordered this encounter  Medications  . Cholecalciferol (VITAMIN D-3) 5000 UNITS TABS    Sig: Take 10,000 tablets by mouth every 7 (seven) days.    Jame Seelig  Sanda Klein, MD, [facility] [phone] office [phone] pager

## 2014-02-18 ENCOUNTER — Telehealth: Payer: Self-pay | Admitting: *Deleted

## 2014-02-18 MED ORDER — ASPIRIN EC 81 MG PO TBEC: 81.0000 mg | DELAYED_RELEASE_TABLET | Freq: Every day | ORAL | Status: DC

## 2014-02-18 NOTE — Telephone Encounter (Signed)
Patient complained of increased bruising while accompanying her husband for his visit.  Dr. Loletha Grayer decreased her ASA to 81mg  daily.

## 2014-03-25 ENCOUNTER — Other Ambulatory Visit: Payer: Self-pay

## 2014-03-25 MED ORDER — DILTIAZEM HCL ER 180 MG PO CP24: 180.0000 mg | ORAL_CAPSULE | Freq: Every day | ORAL | Status: DC

## 2014-03-25 MED ORDER — DIGOXIN 125 MCG PO TABS
0.1250 mg | ORAL_TABLET | Freq: Every day | ORAL | Status: DC
Start: 2014-03-25 — End: 2014-07-08

## 2014-03-25 NOTE — Telephone Encounter (Signed)
Rx sent to pharmacy   

## 2014-05-24 ENCOUNTER — Ambulatory Visit (INDEPENDENT_AMBULATORY_CARE_PROVIDER_SITE_OTHER): Payer: Medicare Other | Admitting: Family Medicine

## 2014-05-24 VITALS — BP 132/67 | HR 92 | Temp 98.4°F | Resp 20 | Ht 63.0 in | Wt 120.0 lb

## 2014-05-24 DIAGNOSIS — M79645 Pain in left finger(s): Secondary | ICD-10-CM

## 2014-05-24 DIAGNOSIS — S61219A Laceration without foreign body of unspecified finger without damage to nail, initial encounter: Secondary | ICD-10-CM

## 2014-05-24 DIAGNOSIS — Z23 Encounter for immunization: Secondary | ICD-10-CM

## 2014-05-24 NOTE — Progress Notes (Signed)
Verbal consent obtained from patient.  Local anesthesia with 2cc Lidocaine 2% without epinephrine.  Wound explored for tendon, ligament damage. Wound scrubbed with soap and water and rinsed. Wound closed with #3 4-0 Prolene sutures.  Wound cleansed and dressed, wound care instructions provided.  Jaynee Eagles, PA-C [facility] [phone] 05/24/2014  4:07 PM

## 2014-05-24 NOTE — Progress Notes (Signed)
Subjective:    Patient ID: Jill Mcdowell, female    DOB: 27-Nov-1926, 80 y.o.   MRN: [id number]  HPI Chief Complaint  Patient presents with  . Laceration    laceration to left pointer finger.  cut this pruning holly today   This chart was scribed for Reginia Forts, MD by Thea Alken, ED Scribe. This patient was seen in room 7 and the patient's care was started at 4:51 PM.  HPI Comments: Jill Mcdowell is a 80 y.o. female who presents to the [facility] complaining of a laceration to left pointer finger that occurred 1 hour ago. Pt states she was pruning holly when she lacerated her distal L second finger. Pt is unsure of last TDAP.  Pt takes an 81mg  aspirin daily. Pt denies numbness and tingling in finger. Pt is allergic to latex which gives her hives.   Past Medical History  Diagnosis Date  . Breast lump   . Thyroid disease   . GERD (gastroesophageal reflux disease)   . CHF (congestive heart failure)   . Arrhythmia 02/06/2012    hx of paroxysmal a fib with no recurrence,rare palpations  . Hypertension 2013    good control  . Kidney stones     history of   Allergies  Allergen Reactions  . Latex     Skin irritation  . Sulfa Antibiotics Hives   Prior to Admission medications   Medication Sig Start Date End Date Taking? Authorizing Provider  aspirin EC 81 MG tablet Take 1 tablet (81 mg total) by mouth daily. 02/18/14  Yes Mihai Croitoru, MD  calcium carbonate (TUMS EX) 750 MG chewable tablet Chew 2-3 tablets by mouth daily.    Yes Historical Provider, MD  Cholecalciferol (VITAMIN D-3) 5000 UNITS TABS Take 10,000 tablets by mouth every 7 (seven) days.   Yes Historical Provider, MD  digoxin (LANOXIN) 0.125 MG tablet Take 1 tablet (0.125 mg total) by mouth daily. 03/25/14  Yes Mihai Croitoru, MD  diltiazem (DILACOR XR) 180 MG 24 hr capsule Take 1 capsule (180 mg total) by mouth daily. 03/25/14  Yes Mihai Croitoru, MD  Hydrocodone-Chlorpheniramine  (CHLORPHENIRAMINE-HYDROCODONE PO) Take by mouth as needed.    Yes Historical Provider, MD  iron polysaccharides (NIFEREX) 150 MG capsule Take 150 mg by mouth 2 (two) times a week.    Yes Historical Provider, MD  levothyroxine (SYNTHROID, LEVOTHROID) 50 MCG tablet Take 50 mcg by mouth daily before breakfast.   Yes Historical Provider, MD  Multiple Vitamins-Minerals (PRESERVISION AREDS PO) Take by mouth.   Yes Historical Provider, MD  omeprazole (PRILOSEC) 20 MG capsule Take 20 mg by mouth daily.   Yes Historical Provider, MD   Review of Systems  Musculoskeletal: Negative for myalgias and arthralgias.  Skin: Positive for wound.  Neurological: Negative for weakness and numbness.   Objective:   Physical Exam  Constitutional: She is oriented to person, place, and time. She appears well-developed and well-nourished. No distress.  HENT:  Head: Normocephalic and atraumatic.  Eyes: Conjunctivae and EOM are normal. Pupils are equal, round, and reactive to light.  Neck: Neck supple.  Pulmonary/Chest: Effort normal.  Musculoskeletal: Normal range of motion.  Neurological: She is alert and oriented to person, place, and time. No sensory deficit.  Skin: Skin is warm and dry. She is not diaphoretic.  1 cm horizontal laceration to left distal second finger along the fat pad with active and vigorous bleeding. Full flexion and extension of left second finger.  Psychiatric: She has a normal mood and affect. Her behavior is normal.  Nursing note and vitals reviewed.   SEE PROCEDURE NOTE.  Assessment & Plan:  Need for Tdap vaccination - Plan: Tdap vaccine greater than or equal to 7yo IM  Finger pain, left  Laceration of finger, left, initial encounter    1. Pain L finger second:  New. Secondary to laceration. Recommend Tylenol PRN. 2. Laceration L second finger: New.  S/p TDAP; s/p suture repair; local wound care reviewed; RTC 7-10 days for suture removal.  RTC sooner for signs of infection.   3.   S/p TDAP.   No orders of the defined types were placed in this encounter.    I personally performed the services described in this documentation, which was scribed in my presence. The recorded information has been reviewed and considered.  Reginia Forts, M.D.  [facility] [address] [place], [place]  [address] [phone] phone [phone] fax

## 2014-05-24 NOTE — Patient Instructions (Addendum)

## 2014-06-02 ENCOUNTER — Ambulatory Visit (INDEPENDENT_AMBULATORY_CARE_PROVIDER_SITE_OTHER): Payer: Medicare Other | Admitting: Physician Assistant

## 2014-06-02 VITALS — BP 120/54 | HR 77 | Temp 98.0°F

## 2014-06-02 DIAGNOSIS — Z4802 Encounter for removal of sutures: Secondary | ICD-10-CM

## 2014-06-02 DIAGNOSIS — S61219A Laceration without foreign body of unspecified finger without damage to nail, initial encounter: Secondary | ICD-10-CM

## 2014-06-02 NOTE — Progress Notes (Signed)
   Subjective:    Patient ID: Jill Mcdowell, female    DOB: Aug 01, 1926, 80 y.o.   MRN: [id number]  HPI Patient presents for suture removal following laceration repair of finger 7 days ago. In the interim, has been well and only has pain when touched. No loss of sensation, fever, or redness.   Review of Systems  Constitutional: Negative for fever and chills.  Musculoskeletal: Negative for joint swelling and arthralgias.  Skin: Positive for wound. Negative for pallor and rash.       Objective:   Physical Exam  Constitutional: She is oriented to person, place, and time. She appears well-developed and well-nourished. No distress.  Blood pressure 120/54, pulse 77, temperature 98 F (36.7 C), temperature source Oral, SpO2 96 %.   HENT:  Head: Normocephalic and atraumatic.  Right Ear: External ear normal.  Left Ear: External ear normal.  Eyes: Right eye exhibits no discharge. Left eye exhibits no discharge.  Neurological: She is alert and oriented to person, place, and time.  Skin: Skin is warm and dry. No rash noted. She is not diaphoretic. No erythema. No pallor.  #3 sutures intact. Wound healed well. No erythema, pus, or warmth present.    Procedure Consent obtained. 3 sutures removed. Procedure well tolerated. Band-aid placed.    Assessment & Plan:  1. Laceration of finger, left, initial encounter Resolved. Sutures removed.   Alveta Heimlich PA-C  [facility] 06/02/2014 3:36 PM

## 2014-06-04 NOTE — Progress Notes (Signed)
The patient was discussed with me and I agree with the diagnosis and treatment plan.  

## 2014-07-02 DIAGNOSIS — D509 Iron deficiency anemia, unspecified: Secondary | ICD-10-CM | POA: Diagnosis not present

## 2014-07-08 ENCOUNTER — Encounter: Payer: Self-pay | Admitting: Cardiovascular Disease

## 2014-07-08 ENCOUNTER — Ambulatory Visit (INDEPENDENT_AMBULATORY_CARE_PROVIDER_SITE_OTHER): Payer: Commercial Managed Care - HMO | Admitting: Cardiovascular Disease

## 2014-07-08 VITALS — BP 122/60 | HR 80 | Resp 16 | Ht 63.0 in | Wt 118.2 lb

## 2014-07-08 DIAGNOSIS — R002 Palpitations: Secondary | ICD-10-CM | POA: Diagnosis not present

## 2014-07-08 DIAGNOSIS — I480 Paroxysmal atrial fibrillation: Secondary | ICD-10-CM | POA: Diagnosis not present

## 2014-07-08 NOTE — Patient Instructions (Signed)
STOP taking your Digoxin   Your physician wants you to follow-up in: 6 months with Dr.Croitoru. You will receive a reminder letter in the mail two months in advance. If you don't receive a letter, please call our office to schedule the follow-up appointment.

## 2014-07-08 NOTE — Progress Notes (Signed)
Patient ID: Jill Mcdowell, female   DOB: 26-Mar-1927, 81 y.o.   MRN: [id number]      Reason for office visit Palpitations, reported history of atrial fibrillation  Jill Mcdowell developed some palpitations and a near syncopal event in April 2015. She was unable to wear a monitor for > 2 weeks due to a rash (only occasional PVCs seen, no evidence of atrial fibrillation). She continued to have weakness and dizziness. She tells me she was found to be anemic (Hgb 7.6 by her report) and that she feels much better after starting treatment with iron. She does not appear pale today.  She bears a diagnosis of mitral valve prolapse and had a normal nuclear stress test about 5 years ago. She has preserved left ventricular systolic function. It has been many years since she had firm documentation of atrial fibrillation (pernotes, had a single episode of AF in the 1990s, but there is no ECG/telemtry strip documenting it in her chart). Technically with a CHADS2Vasc2 score of 3, she would meet criteria for anti-coagulation. However when taking into account her HAS BLED score, the benefit of anticoagulants for stroke prevention is more than counterbalanced by the increased risk of bleeding. In addition she only receives points on our scoring system because of her age and gender and she does not have any other risk factors. She is biologically younger than her chronological age.   Allergies  Allergen Reactions  . Latex     Skin irritation  . Sulfa Antibiotics Hives    Current Outpatient Prescriptions  Medication Sig Dispense Refill  . aspirin EC 81 MG tablet Take 1 tablet (81 mg total) by mouth daily. 90 tablet 3  . calcium carbonate (TUMS EX) 750 MG chewable tablet Chew 2-3 tablets by mouth daily.     . Cholecalciferol (VITAMIN D-3) 5000 UNITS TABS Take 10,000 tablets by mouth every 7 (seven) days.    . digoxin (LANOXIN) 0.125 MG tablet Take 1 tablet (0.125 mg total) by mouth daily. 90 tablet 3  . diltiazem  (DILACOR XR) 180 MG 24 hr capsule Take 1 capsule (180 mg total) by mouth daily. 90 capsule 3  . Hydrocodone-Chlorpheniramine (CHLORPHENIRAMINE-HYDROCODONE PO) Take by mouth as needed.     . iron polysaccharides (NIFEREX) 150 MG capsule Take 150 mg by mouth 2 (two) times daily.     [name] levothyroxine (SYNTHROID, LEVOTHROID) 50 MCG tablet Take 50 mcg by mouth daily before breakfast.    . Multiple Vitamins-Minerals (PRESERVISION AREDS PO) Take by mouth.    [name] omeprazole (PRILOSEC) 20 MG capsule Take 20 mg by mouth daily.     No current facility-administered medications for this visit.    Past Medical History  Diagnosis Date  . Breast lump   . Thyroid disease   . GERD (gastroesophageal reflux disease)   . CHF (congestive heart failure)   . Arrhythmia 02/06/2012    hx of paroxysmal a fib with no recurrence,rare palpations  . Hypertension 2013    good control  . Kidney stones     history of    Past Surgical History  Procedure Laterality Date  . Abdominal hysterectomy    . Coronary artery bypass graft    . Supraventricular tachycardia ablation    . Nm myocar perf wall motion  Feb 19 2008    negative for ischemia    Family History  Problem Relation Age of Onset  . Cancer Mother   . Stroke Father   . Cancer Brother  History   Social History  . Marital Status: Married    Spouse Name: N/A    Number of Children: N/A  . Years of Education: N/A   Occupational History  . Not on file.   Social History Main Topics  . Smoking status: Former Research scientist (life sciences)  . Smokeless tobacco: Not on file  . Alcohol Use: No  . Drug Use: No  . Sexual Activity: Not on file   Other Topics Concern  . Not on file   Social History Narrative    Review of systems: Weakness, dizziness (improved) The patient specifically denies any chest pain at rest or with exertion, dyspnea at rest or with exertion, orthopnea, paroxysmal nocturnal dyspnea, syncope, palpitations, focal neurological deficits,  intermittent claudication, lower extremity edema, unexplained weight gain, cough, hemoptysis or wheezing.  The patient also denies abdominal pain, nausea, vomiting, dysphagia, diarrhea, constipation, polyuria, polydipsia, dysuria, hematuria, frequency, urgency, abnormal bleeding or bruising, fever, chills, unexpected weight changes, mood swings, change in skin or hair texture, change in voice quality, auditory or visual problems, allergic reactions or rashes, new musculoskeletal complaints other than usual "aches and pains".   PHYSICAL EXAM BP 122/60 mmHg  Pulse 80  Resp 16  Ht 5\' 3"  (1.6 m)  Wt 118 lb 3.2 oz (53.615 kg)  BMI 20.94 kg/m2  General: Alert, oriented x3, no distress Head: no evidence of trauma, PERRL, EOMI, no exophtalmos or lid lag, no myxedema, no xanthelasma; normal ears, nose and oropharynx Neck: normal jugular venous pulsations and no hepatojugular reflux; brisk carotid pulses without delay and no carotid bruits Chest: clear to auscultation, no signs of consolidation by percussion or palpation, normal fremitus, symmetrical and full respiratory excursions Cardiovascular: normal position and quality of the apical impulse, regular rhythm, normal first and second heart sounds, no murmurs, rubs or gallops Abdomen: no tenderness or distention, no masses by palpation, no abnormal pulsatility or arterial bruits, normal bowel sounds, no hepatosplenomegaly Extremities: no clubbing, cyanosis or edema; 2+ radial, ulnar and brachial pulses bilaterally; 2+ right femoral, posterior tibial and dorsalis pedis pulses; 2+ left femoral, posterior tibial and dorsalis pedis pulses; no subclavian or femoral bruits Neurological: grossly nonfocal   EKG: NSR, mild ST changes (probably digoxin effect)  Lipid Panel  No results found for: CHOL, TRIG, HDL, CHOLHDL, VLDL, LDLCALC, LDLDIRECT  BMET    Component Value Date/Time   NA 139 02/04/2011 1903   K 4.0 02/04/2011 1903   CL 103 02/04/2011  1903   CO2 26 02/04/2011 1903   GLUCOSE 148* 02/04/2011 1903   BUN 12 02/04/2011 1903   CREATININE 0.90 02/04/2011 1903   CALCIUM 9.2 02/04/2011 1903   GFRNONAA >60 02/04/2011 1903   GFRAA >60 02/04/2011 1903     ASSESSMENT AND PLAN  No firm documentation of atrial fibrillation (although repeatedly mentioned in 1990s notes), definitely no AF in last 2 decades. The risk / benefit ratio is even less favorable after her diagnosis of iron deficiency anemia. Continue ASA, although even this can be temporarily stopped if there is concern that it is causing GI bleeding.  There is little benefit to digoxin at this point (if ever) - will stop it. Continue diltiazem for palpitations.  If a firm diagnosis of AF becomes imperative, will recommend implantable loop recorder (she had severe blistering rash with event monitor leads). Orders Placed This Encounter  Procedures  . EKG 12-Lead   No orders of the defined types were placed in this encounter.    Saisha Hogue  Osie Amparo,  MD, [facility] [phone] office [phone] pager

## 2014-07-10 DIAGNOSIS — D509 Iron deficiency anemia, unspecified: Secondary | ICD-10-CM | POA: Diagnosis not present

## 2014-07-13 DIAGNOSIS — D509 Iron deficiency anemia, unspecified: Secondary | ICD-10-CM | POA: Diagnosis not present

## 2014-07-13 DIAGNOSIS — R195 Other fecal abnormalities: Secondary | ICD-10-CM | POA: Diagnosis not present

## 2014-07-23 DIAGNOSIS — D509 Iron deficiency anemia, unspecified: Secondary | ICD-10-CM | POA: Diagnosis not present

## 2014-07-23 DIAGNOSIS — R195 Other fecal abnormalities: Secondary | ICD-10-CM | POA: Diagnosis not present

## 2014-07-23 DIAGNOSIS — K5730 Diverticulosis of large intestine without perforation or abscess without bleeding: Secondary | ICD-10-CM | POA: Diagnosis not present

## 2014-07-23 DIAGNOSIS — K648 Other hemorrhoids: Secondary | ICD-10-CM | POA: Diagnosis not present

## 2014-07-23 DIAGNOSIS — K449 Diaphragmatic hernia without obstruction or gangrene: Secondary | ICD-10-CM | POA: Diagnosis not present

## 2014-08-06 DIAGNOSIS — D509 Iron deficiency anemia, unspecified: Secondary | ICD-10-CM | POA: Diagnosis not present

## 2014-08-07 ENCOUNTER — Other Ambulatory Visit: Payer: Self-pay

## 2014-08-07 MED ORDER — DILTIAZEM HCL ER 180 MG PO CP24: 180.0000 mg | ORAL_CAPSULE | Freq: Every day | ORAL | Status: DC

## 2014-08-07 NOTE — Telephone Encounter (Signed)
Rx(s) sent to pharmacy electronically.  

## 2014-09-08 ENCOUNTER — Encounter: Payer: Self-pay | Admitting: Cardiovascular Disease

## 2014-09-15 DIAGNOSIS — H3531 Nonexudative age-related macular degeneration: Secondary | ICD-10-CM | POA: Diagnosis not present

## 2014-09-15 DIAGNOSIS — H43813 Vitreous degeneration, bilateral: Secondary | ICD-10-CM | POA: Diagnosis not present

## 2014-09-23 DIAGNOSIS — L089 Local infection of the skin and subcutaneous tissue, unspecified: Secondary | ICD-10-CM | POA: Diagnosis not present

## 2014-09-23 DIAGNOSIS — L821 Other seborrheic keratosis: Secondary | ICD-10-CM | POA: Diagnosis not present

## 2014-09-23 DIAGNOSIS — D239 Other benign neoplasm of skin, unspecified: Secondary | ICD-10-CM | POA: Diagnosis not present

## 2014-09-23 DIAGNOSIS — D485 Neoplasm of uncertain behavior of skin: Secondary | ICD-10-CM | POA: Diagnosis not present

## 2014-09-23 DIAGNOSIS — L570 Actinic keratosis: Secondary | ICD-10-CM | POA: Diagnosis not present

## 2014-12-02 DIAGNOSIS — Z1231 Encounter for screening mammogram for malignant neoplasm of breast: Secondary | ICD-10-CM | POA: Diagnosis not present

## 2015-02-04 DIAGNOSIS — H3531 Nonexudative age-related macular degeneration: Secondary | ICD-10-CM | POA: Diagnosis not present

## 2015-02-04 DIAGNOSIS — H26493 Other secondary cataract, bilateral: Secondary | ICD-10-CM | POA: Diagnosis not present

## 2015-02-04 DIAGNOSIS — Z961 Presence of intraocular lens: Secondary | ICD-10-CM | POA: Diagnosis not present

## 2015-02-05 ENCOUNTER — Ambulatory Visit (INDEPENDENT_AMBULATORY_CARE_PROVIDER_SITE_OTHER): Payer: Commercial Managed Care - HMO | Admitting: Cardiovascular Disease

## 2015-02-05 ENCOUNTER — Encounter: Payer: Self-pay | Admitting: Cardiovascular Disease

## 2015-02-05 VITALS — BP 118/66 | HR 82 | Ht 62.0 in | Wt 129.0 lb

## 2015-02-05 DIAGNOSIS — R002 Palpitations: Secondary | ICD-10-CM

## 2015-02-05 DIAGNOSIS — I480 Paroxysmal atrial fibrillation: Secondary | ICD-10-CM | POA: Diagnosis not present

## 2015-02-05 DIAGNOSIS — R55 Syncope and collapse: Secondary | ICD-10-CM | POA: Diagnosis not present

## 2015-02-05 NOTE — Patient Instructions (Signed)
Dr. Croitoru recommends that you schedule a follow-up appointment in: one year   

## 2015-02-05 NOTE — Progress Notes (Signed)
Patient ID: Jill Mcdowell, female   DOB: Nov 28, 1926, 81 y.o.   MRN: [id number]     Cardiology Office Note   Date:  02/06/2015   ID:  ASHLON LOTTMAN, DOB 03-18-1927, MRN [id number]  PCP:  Gerrit Heck, MD  Cardiologist:   Sanda Klein, MD   Chief Complaint  Patient presents with  . 6 MONTHS    Patient has no complaints.      History of Present Illness: DEVANSHI Mcdowell is a 81 y.o. female who presents for  Follow-up for a remote history of very infrequent atrial fibrillation. No arrhythmia has been documented in very many years. She feels better than she has felt in a very long time. She actually rates her improvement to increase in her dose of iron supplement and resolution of anemia. She has not had any further complaints of near syncope. She denies overt bleeding , abdominal pain or change in bowel patterns, angina, dyspnea , lower extremity edema or focal neurological complaints.  She has infrequent and very brief palpitations, 1-2 beats long.  She bears a diagnosis of mitral valve prolapse and had a normal nuclear stress test about 5 years ago. She has preserved left ventricular systolic function. It has been many years since she had firm documentation of atrial fibrillation (pernotes, had a single episode of AF in the 1990s, but there is no ECG/telemtry strip documenting it in her chart). Technically with a CHADS2Vasc2 score of 3, she would meet criteria for anti-coagulation. However when taking into account her HAS BLED score, the benefit of anticoagulants for stroke prevention is more than counterbalanced by the increased risk of bleeding. She is biologically younger than her chronological age.   Past Medical History  Diagnosis Date  . Breast lump   . Thyroid disease   . GERD (gastroesophageal reflux disease)   . CHF (congestive heart failure)   . Arrhythmia 02/06/2012    hx of paroxysmal a fib with no recurrence,rare palpations  . Hypertension 2013    good control    . Kidney stones     history of    Past Surgical History  Procedure Laterality Date  . Abdominal hysterectomy    . Coronary artery bypass graft    . Supraventricular tachycardia ablation    . Nm myocar perf wall motion  Feb 19 2008    negative for ischemia     Current Outpatient Prescriptions  Medication Sig Dispense Refill  . aspirin EC 81 MG tablet Take 1 tablet (81 mg total) by mouth daily. 90 tablet 3  . calcium carbonate (TUMS EX) 750 MG chewable tablet Chew 2-3 tablets by mouth daily.     . Cholecalciferol (VITAMIN D-3) 5000 UNITS TABS Take 10,000 tablets by mouth every 7 (seven) days.    [name] diltiazem (DILACOR XR) 180 MG 24 hr capsule Take 1 capsule (180 mg total) by mouth daily. 90 capsule 3  . levothyroxine (SYNTHROID, LEVOTHROID) 50 MCG tablet Take 50 mcg by mouth daily before breakfast.    . Multiple Vitamins-Minerals (PRESERVISION AREDS PO) Take by mouth.    [name] omeprazole (PRILOSEC) 20 MG capsule Take 20 mg by mouth daily.     No current facility-administered medications for this visit.    Allergies:   Latex and Sulfa antibiotics    Social History:  The patient  reports that she has quit smoking. She does not have any smokeless tobacco history on file. She reports that she does not drink alcohol or use illicit drugs.  Family History:  The patient's family history includes Cancer in her brother and mother; Stroke in her father.    ROS:  Please see the history of present illness.    Otherwise, review of systems positive for none.   All other systems are reviewed and negative.    PHYSICAL EXAM: VS:  BP 118/66 mmHg  Pulse 82  Ht 5\' 2"  (1.575 m)  Wt 129 lb (58.514 kg)  BMI 23.59 kg/m2 , BMI Body mass index is 23.59 kg/(m^2).  General: Alert, oriented x3, no distress Head: no evidence of trauma, PERRL, EOMI, no exophtalmos or lid lag, no myxedema, no xanthelasma; normal ears, nose and oropharynx Neck: normal jugular venous pulsations and no hepatojugular reflux;  brisk carotid pulses without delay and no carotid bruits Chest: clear to auscultation, no signs of consolidation by percussion or palpation, normal fremitus, symmetrical and full respiratory excursions Cardiovascular: normal position and quality of the apical impulse, regular rhythm, normal first and second heart sounds, no murmurs, rubs or gallops Abdomen: no tenderness or distention, no masses by palpation, no abnormal pulsatility or arterial bruits, normal bowel sounds, no hepatosplenomegaly Extremities: no clubbing, cyanosis or edema; 2+ radial, ulnar and brachial pulses bilaterally; 2+ right femoral, posterior tibial and dorsalis pedis pulses; 2+ left femoral, posterior tibial and dorsalis pedis pulses; no subclavian or femoral bruits Neurological: grossly nonfocal Psych: euthymic mood, full affect   EKG:  EKG is ordered today. The ekg ordered today demonstrates NSR, QTc 443 ms   Recent Labs: No results found for requested labs within last 365 days.    Lipid Panel No results found for: CHOL, TRIG, HDL, CHOLHDL, VLDL, LDLCALC, LDLDIRECT    Wt Readings from Last 3 Encounters:  02/05/15 129 lb (58.514 kg)  07/08/14 118 lb 3.2 oz (53.615 kg)  05/24/14 120 lb (54.432 kg)    .   ASSESSMENT AND PLAN:  No firm documentation of atrial fibrillation (although repeatedly mentioned in 1990s notes), definitely no AF in last 2 decades. The risk / benefit ratio is even less favorable after her diagnosis of iron deficiency anemia. Continue ASA, although even this can be temporarily stopped if there is concern that it is causing GI bleeding. Continue diltiazem for isolated palpitations.  If a firm diagnosis of AF becomes imperative, will recommend implantable loop recorder (she had severe blistering rash with event monitor leads).    Current medicines are reviewed at length with the patient today.  The patient does not have concerns regarding medicines.  The following changes have been  made:  no change  Labs/ tests ordered today include:  Orders Placed This Encounter  Procedures  . EKG 12-Lead    Patient Instructions  Dr. Sallyanne Kuster recommends that you schedule a follow-up appointment in: one year.       Mikael Spray, MD  02/06/2015 5:46 PM    Sanda Klein, MD, [facility] [phone] office [phone] pager

## 2015-02-22 DIAGNOSIS — D509 Iron deficiency anemia, unspecified: Secondary | ICD-10-CM | POA: Diagnosis not present

## 2015-02-25 DIAGNOSIS — L570 Actinic keratosis: Secondary | ICD-10-CM | POA: Diagnosis not present

## 2015-02-25 DIAGNOSIS — L820 Inflamed seborrheic keratosis: Secondary | ICD-10-CM | POA: Diagnosis not present

## 2015-02-25 DIAGNOSIS — D485 Neoplasm of uncertain behavior of skin: Secondary | ICD-10-CM | POA: Diagnosis not present

## 2015-03-18 DIAGNOSIS — H43813 Vitreous degeneration, bilateral: Secondary | ICD-10-CM | POA: Diagnosis not present

## 2015-03-18 DIAGNOSIS — H3531 Nonexudative age-related macular degeneration: Secondary | ICD-10-CM | POA: Diagnosis not present

## 2015-06-07 DIAGNOSIS — D509 Iron deficiency anemia, unspecified: Secondary | ICD-10-CM | POA: Diagnosis not present

## 2015-06-14 ENCOUNTER — Other Ambulatory Visit: Payer: Self-pay | Admitting: Cardiovascular Disease

## 2015-06-15 NOTE — Telephone Encounter (Signed)
REFILL 

## 2015-09-09 DIAGNOSIS — H35342 Macular cyst, hole, or pseudohole, left eye: Secondary | ICD-10-CM | POA: Diagnosis not present

## 2015-09-09 DIAGNOSIS — H353132 Nonexudative age-related macular degeneration, bilateral, intermediate dry stage: Secondary | ICD-10-CM | POA: Diagnosis not present

## 2015-09-09 DIAGNOSIS — H43813 Vitreous degeneration, bilateral: Secondary | ICD-10-CM | POA: Diagnosis not present

## 2015-09-15 DIAGNOSIS — M5489 Other dorsalgia: Secondary | ICD-10-CM | POA: Diagnosis not present

## 2015-09-18 ENCOUNTER — Emergency Department (HOSPITAL_COMMUNITY)
Admission: EM | Admit: 2015-09-18 | Discharge: 2015-09-18 | Disposition: A | Discharge status: Home / Self Care | Payer: Commercial Managed Care - HMO | Attending: Emergency Medicine | Admitting: Emergency Medicine

## 2015-09-18 ENCOUNTER — Emergency Department (HOSPITAL_COMMUNITY): Payer: Commercial Managed Care - HMO

## 2015-09-18 ENCOUNTER — Encounter (HOSPITAL_COMMUNITY): Payer: Self-pay | Admitting: Emergency Medicine

## 2015-09-18 DIAGNOSIS — K5900 Constipation, unspecified: Secondary | ICD-10-CM

## 2015-09-18 DIAGNOSIS — R630 Anorexia: Secondary | ICD-10-CM | POA: Diagnosis not present

## 2015-09-18 DIAGNOSIS — K219 Gastro-esophageal reflux disease without esophagitis: Secondary | ICD-10-CM | POA: Diagnosis not present

## 2015-09-18 DIAGNOSIS — R110 Nausea: Secondary | ICD-10-CM | POA: Insufficient documentation

## 2015-09-18 DIAGNOSIS — Z79899 Other long term (current) drug therapy: Secondary | ICD-10-CM | POA: Diagnosis not present

## 2015-09-18 DIAGNOSIS — I10 Essential (primary) hypertension: Secondary | ICD-10-CM | POA: Diagnosis not present

## 2015-09-18 DIAGNOSIS — Z8742 Personal history of other diseases of the female genital tract: Secondary | ICD-10-CM | POA: Insufficient documentation

## 2015-09-18 DIAGNOSIS — Z87891 Personal history of nicotine dependence: Secondary | ICD-10-CM | POA: Insufficient documentation

## 2015-09-18 DIAGNOSIS — E079 Disorder of thyroid, unspecified: Secondary | ICD-10-CM | POA: Diagnosis not present

## 2015-09-18 DIAGNOSIS — Z87442 Personal history of urinary calculi: Secondary | ICD-10-CM | POA: Diagnosis not present

## 2015-09-18 DIAGNOSIS — Z951 Presence of aortocoronary bypass graft: Secondary | ICD-10-CM | POA: Diagnosis not present

## 2015-09-18 DIAGNOSIS — Z7982 Long term (current) use of aspirin: Secondary | ICD-10-CM | POA: Diagnosis not present

## 2015-09-18 DIAGNOSIS — M545 Low back pain, unspecified: Secondary | ICD-10-CM

## 2015-09-18 DIAGNOSIS — R21 Rash and other nonspecific skin eruption: Secondary | ICD-10-CM | POA: Insufficient documentation

## 2015-09-18 DIAGNOSIS — Z91040 Latex allergy status: Secondary | ICD-10-CM | POA: Insufficient documentation

## 2015-09-18 DIAGNOSIS — M47816 Spondylosis without myelopathy or radiculopathy, lumbar region: Secondary | ICD-10-CM | POA: Diagnosis not present

## 2015-09-18 DIAGNOSIS — N390 Urinary tract infection, site not specified: Secondary | ICD-10-CM | POA: Insufficient documentation

## 2015-09-18 DIAGNOSIS — I509 Heart failure, unspecified: Secondary | ICD-10-CM | POA: Diagnosis not present

## 2015-09-18 LAB — URINE MICROSCOPIC-ADD ON

## 2015-09-18 LAB — URINALYSIS, ROUTINE W REFLEX MICROSCOPIC
Bilirubin Urine: NEGATIVE
Glucose, UA: NEGATIVE mg/dL
Hgb urine dipstick: NEGATIVE
Ketones, ur: NEGATIVE mg/dL
Nitrite: POSITIVE — AB
Protein, ur: NEGATIVE mg/dL
Specific Gravity, Urine: 1.008 (ref 1.005–1.030)
pH: 7 (ref 5.0–8.0)

## 2015-09-18 MED ORDER — CEPHALEXIN 500 MG PO CAPS: 500.0000 mg | ORAL_CAPSULE | Freq: Four times a day (QID) | ORAL | Status: DC

## 2015-09-18 MED ORDER — TRAMADOL HCL 50 MG PO TABS: 50.0000 mg | ORAL_TABLET | Freq: Two times a day (BID) | ORAL | Status: DC | PRN

## 2015-09-18 MED ORDER — CYCLOBENZAPRINE HCL 10 MG PO TABS
5.0000 mg | ORAL_TABLET | ORAL | Status: AC
  Administered 2015-09-18: 5 mg via ORAL
  Filled 2015-09-18: qty 1

## 2015-09-18 MED ORDER — ACETAMINOPHEN 500 MG PO TABS
1000.0000 mg | ORAL_TABLET | Freq: Once | ORAL | Status: AC
  Administered 2015-09-18: 1000 mg via ORAL
  Filled 2015-09-18: qty 2

## 2015-09-18 MED ORDER — ONDANSETRON 4 MG PO TBDP: 4.0000 mg | ORAL_TABLET | Freq: Three times a day (TID) | ORAL | Status: DC | PRN

## 2015-09-18 MED ORDER — CEPHALEXIN 250 MG PO CAPS
500.0000 mg | ORAL_CAPSULE | Freq: Once | ORAL | Status: AC
Start: 2015-09-18 — End: 2015-09-18
  Administered 2015-09-18: 500 mg via ORAL
  Filled 2015-09-18: qty 2

## 2015-09-18 MED ORDER — CYCLOBENZAPRINE HCL 5 MG PO TABS: 5.0000 mg | ORAL_TABLET | Freq: Two times a day (BID) | ORAL | Status: DC | PRN

## 2015-09-18 MED ORDER — TRAMADOL HCL 50 MG PO TABS
50.0000 mg | ORAL_TABLET | Freq: Once | ORAL | Status: AC
  Administered 2015-09-18: 50 mg via ORAL
  Filled 2015-09-18: qty 1

## 2015-09-18 MED ORDER — ONDANSETRON 4 MG PO TBDP
4.0000 mg | ORAL_TABLET | Freq: Once | ORAL | Status: AC
  Administered 2015-09-18: 4 mg via ORAL
  Filled 2015-09-18: qty 1

## 2015-09-18 NOTE — Discharge Instructions (Signed)
Constipation, Adult Constipation is when a person has fewer than three bowel movements a week, has difficulty having a bowel movement, or has stools that are dry, hard, or larger than normal. As people grow older, constipation is more common. A low-fiber diet, not taking in enough fluids, and taking certain medicines may make constipation worse.  CAUSES   Certain medicines, such as antidepressants, pain medicine, iron supplements, antacids, and water pills.   Certain diseases, such as diabetes, irritable bowel syndrome (IBS), thyroid disease, or depression.   Not drinking enough water.   Not eating enough fiber-rich foods.   Stress or travel.   Lack of physical activity or exercise.   Ignoring the urge to have a bowel movement.   Using laxatives too much.  SIGNS AND SYMPTOMS   Having fewer than three bowel movements a week.   Straining to have a bowel movement.   Having stools that are hard, dry, or larger than normal.   Feeling full or bloated.   Pain in the lower abdomen.   Not feeling relief after having a bowel movement.  DIAGNOSIS  Your health care provider will take a medical history and perform a physical exam. Further testing may be done for severe constipation. Some tests may include:  A barium enema X-ray to examine your rectum, colon, and, sometimes, your small intestine.   A sigmoidoscopy to examine your lower colon.   A colonoscopy to examine your entire colon. TREATMENT  Treatment will depend on the severity of your constipation and what is causing it. Some dietary treatments include drinking more fluids and eating more fiber-rich foods. Lifestyle treatments may include regular exercise. If these diet and lifestyle recommendations do not help, your health care provider may recommend taking over-the-counter laxative medicines to help you have bowel movements. Prescription medicines may be prescribed if over-the-counter medicines do not work.    HOME CARE INSTRUCTIONS   Eat foods that have a lot of fiber, such as fruits, vegetables, whole grains, and beans.  Limit foods high in fat and processed sugars, such as french fries, hamburgers, cookies, candies, and soda.   A fiber supplement may be added to your diet if you cannot get enough fiber from foods.   Drink enough fluids to keep your urine clear or pale yellow.   Exercise regularly or as directed by your health care provider.   Go to the restroom when you have the urge to go. Do not hold it.   Only take over-the-counter or prescription medicines as directed by your health care provider. Do not take other medicines for constipation without talking to your health care provider first.  [place] IF:   You have bright red blood in your stool.   Your constipation lasts for more than 4 days or gets worse.   You have abdominal or rectal pain.   You have thin, pencil-like stools.   You have unexplained weight loss. MAKE SURE YOU:   Understand these instructions.  Will watch your condition.  Will get help right away if you are not doing well or get worse.   This information is not intended to replace advice given to you by your health care provider. Make sure you discuss any questions you have with your health care provider.   Document Released: 03/10/2004 Document Revised: 07/03/2014 Document Reviewed: 03/24/2013 Elsevier Interactive Patient Education 2016 Elsevier Inc[employer]  Urinary Tract Infection Urinary tract infections (UTIs) can develop anywhere along your urinary tract. Your urinary tract is your  body's drainage system for removing wastes and extra water. Your urinary tract includes two kidneys, two ureters, a bladder, and a urethra. Your kidneys are a pair of bean-shaped organs. Each kidney is about the size of your fist. They are located below your ribs, one on each side of your spine. CAUSES Infections are caused by microbes, which are  microscopic organisms, including fungi, viruses, and bacteria. These organisms are so small that they can only be seen through a microscope. Bacteria are the microbes that most commonly cause UTIs. SYMPTOMS  Symptoms of UTIs may vary by age and gender of the patient and by the location of the infection. Symptoms in young women typically include a frequent and intense urge to urinate and a painful, burning feeling in the bladder or urethra during urination. Older women and men are more likely to be tired, shaky, and weak and have muscle aches and abdominal pain. A fever may mean the infection is in your kidneys. Other symptoms of a kidney infection include pain in your back or sides below the ribs, nausea, and vomiting. DIAGNOSIS To diagnose a UTI, your caregiver will ask you about your symptoms. Your caregiver will also ask you to provide a urine sample. The urine sample will be tested for bacteria and white blood cells. White blood cells are made by your body to help fight infection. TREATMENT  Typically, UTIs can be treated with medication. Because most UTIs are caused by a bacterial infection, they usually can be treated with the use of antibiotics. The choice of antibiotic and length of treatment depend on your symptoms and the type of bacteria causing your infection. HOME CARE INSTRUCTIONS  If you were prescribed antibiotics, take them exactly as your caregiver instructs you. Finish the medication even if you feel better after you have only taken some of the medication.  Drink enough water and fluids to keep your urine clear or pale yellow.  Avoid caffeine, tea, and carbonated beverages. They tend to irritate your bladder.  Empty your bladder often. Avoid holding urine for long periods of time.  Empty your bladder before and after sexual intercourse.  After a bowel movement, women should cleanse from front to back. Use each tissue only once. SEEK MEDICAL CARE IF:   You have back  pain.  You develop a fever.  Your symptoms do not begin to resolve within 3 days. SEEK IMMEDIATE MEDICAL CARE IF:   You have severe back pain or lower abdominal pain.  You develop chills.  You have nausea or vomiting.  You have continued burning or discomfort with urination. MAKE SURE YOU:   Understand these instructions.  Will watch your condition.  Will get help right away if you are not doing well or get worse.   This information is not intended to replace advice given to you by your health care provider. Make sure you discuss any questions you have with your health care provider.   Document Released: 03/22/2005 Document Revised: 03/03/2015 Document Reviewed: 07/21/2011 Elsevier Interactive Patient Education 2016 Else[employer] Inc.  Back Pain, Adult Back pain is very common in adults.The cause of back pain is rarely dangerous and the pain often gets better over time.The cause of your back pain may not be known. Some common causes of back pain include:  Strain of the muscles or ligaments supporting the spine.  Wear and tear (degeneration) of the spinal disks.  Arthritis.  Direct injury to the back. For many people, back pain may return. Since back pain  is rarely dangerous, most people can learn to manage this condition on their own. HOME CARE INSTRUCTIONS Watch your back pain for any changes. The following actions may help to lessen any discomfort you are feeling:  Remain active. It is stressful on your back to sit or stand in one place for long periods of time. Do not sit, drive, or stand in one place for more than 30 minutes at a time. Take short walks on even surfaces as soon as you are able.Try to increase the length of time you walk each day.  Exercise regularly as directed by your health care provider. Exercise helps your back heal faster. It also helps avoid future injury by keeping your muscles strong and flexible.  Do not stay in bed.Resting more than 1-2 days  can delay your recovery.  Pay attention to your body when you bend and lift. The most comfortable positions are those that put less stress on your recovering back. Always use proper lifting techniques, including:  Bending your knees.  Keeping the load close to your body.  Avoiding twisting.  Find a comfortable position to sleep. Use a firm mattress and lie on your side with your knees slightly bent. If you lie on your back, put a pillow under your knees.  Avoid feeling anxious or stressed.Stress increases muscle tension and can worsen back pain.It is important to recognize when you are anxious or stressed and learn ways to manage it, such as with exercise.  Take medicines only as directed by your health care provider. Over-the-counter medicines to reduce pain and inflammation are often the most helpful.Your health care provider may prescribe muscle relaxant drugs.These medicines help dull your pain so you can more quickly return to your normal activities and healthy exercise.  Apply ice to the injured area:  Put ice in a plastic bag.  Place a towel between your skin and the bag.  Leave the ice on for 20 minutes, 2-3 times a day for the first 2-3 days. After that, ice and heat may be alternated to reduce pain and spasms.  Maintain a healthy weight. Excess weight puts extra stress on your back and makes it difficult to maintain good posture. SEEK MEDICAL CARE IF:  You have pain that is not relieved with rest or medicine.  You have increasing pain going down into the legs or buttocks.  You have pain that does not improve in one week.  You have night pain.  You lose weight.  You have a fever or chills. SEEK IMMEDIATE MEDICAL CARE IF:   You develop new bowel or bladder control problems.  You have unusual weakness or numbness in your arms or legs.  You develop nausea or vomiting.  You develop abdominal pain.  You feel faint.   This information is not intended to  replace advice given to you by your health care provider. Make sure you discuss any questions you have with your health care provider.   Document Released: 06/12/2005 Document Revised: 07/03/2014 Document Reviewed: 10/14/2013 Elsevier Interactive Patient Education [employer].

## 2015-09-18 NOTE — ED Provider Notes (Signed)
CSN: [id number]     Arrival date & time 09/18/15  0911 History   First MD Initiated Contact with Patient 09/18/15 1005     Chief Complaint  Patient presents with  . Constipation  . Back Pain     (Consider location/radiation/quality/duration/timing/severity/associated sxs/prior Treatment) HPI   Patient is a 82 year old female with history of GERD, CHF, hypertension, kidney stones, GERD, she presents emergency room with approximately 1 week of continued right mid to low pain that began after she bent over and adjusted her husband's chair. She has been seen 2 times by her primary care provider. The first time she was given meloxicam to treat. She did not feel like she was improving so she called them and they sent her for lumbar spine x-rays however she was unable to obtain them because the scanner was down.  She describes her pain as achy with muscle spasms without radiation, pain rated 7 out of 10.  Her pain is worsened when going from laying to sitting or sitting to standing. She states that it is not worsened with palpation .  She has had kidney stones in the past and she states that it does not feel similar to when she had kidney stones. She also complains of a recent worsening of her chronic constipation.  She states that she has not had a bowel movement since Wednesday, 4 days ago, despite treatment with MiraLAX, enemas, suppositories. She endorses mild nausea but she is able to pass gas and she has not had any vomiting. She also denies fever, belly pain. She states that she has been able urinate like normal.  Past Medical History  Diagnosis Date  . Breast lump   . Thyroid disease   . GERD (gastroesophageal reflux disease)   . CHF (congestive heart failure) ([place])   . Arrhythmia 02/06/2012    hx of paroxysmal a fib with no recurrence,rare palpations  . Hypertension 2013    good control  . Kidney stones     history of   Past Surgical History  Procedure Laterality Date  . Abdominal  hysterectomy    . Coronary artery bypass graft    . Supraventricular tachycardia ablation    . Nm myocar perf wall motion  Feb 19 2008    negative for ischemia   Family History  Problem Relation Age of Onset  . Cancer Mother   . Stroke Father   . Cancer Brother    Social History  Substance Use Topics  . Smoking status: Former Research scientist (life sciences)  . Smokeless tobacco: None  . Alcohol Use: No   OB History    No data available     Review of Systems  Constitutional: Positive for appetite change. Negative for fever, chills, diaphoresis, activity change and fatigue.  HENT: Negative.   Eyes: Negative.   Respiratory: Negative.   Cardiovascular: Negative.   Gastrointestinal: Positive for nausea and constipation. Negative for vomiting, abdominal pain, diarrhea, blood in stool and abdominal distention.  Endocrine: Negative.   Genitourinary: Positive for flank pain. Negative for dysuria and difficulty urinating.  Musculoskeletal: Positive for back pain. Negative for myalgias, joint swelling, gait problem, neck pain and neck stiffness.  Skin: Positive for rash. Negative for color change and pallor.  Allergic/Immunologic: Negative.   Neurological: Negative.  Negative for dizziness, weakness, light-headedness and numbness.  Psychiatric/Behavioral: Negative.   All other systems reviewed and are negative.     Allergies  Latex and Sulfa antibiotics  Home Medications   Prior to Admission medications  Medication Sig Start Date End Date Taking? Authorizing Provider  aspirin EC 81 MG tablet Take 1 tablet (81 mg total) by mouth daily. Patient taking differently: Take 81 mg by mouth every other day.  02/18/14  Yes Mihai Croitoru, MD  calcium carbonate (TUMS EX) 750 MG chewable tablet Chew 2-3 tablets by mouth daily.    Yes Historical Provider, MD  Cholecalciferol (VITAMIN D-3) 5000 UNITS TABS Take 10,000 tablets by mouth every 7 (seven) days.   Yes Historical Provider, MD  DILT-XR 180 MG 24 hr capsule  TAKE 1 CAPSULE (180 MG TOTAL) BY MOUTH DAILY. 06/15/15  Yes Mihai Croitoru, MD  levothyroxine (SYNTHROID, LEVOTHROID) 50 MCG tablet Take 50 mcg by mouth daily before breakfast.   Yes Historical Provider, MD  Multiple Vitamins-Minerals (PRESERVISION AREDS PO) Take by mouth.   Yes Historical Provider, MD  omeprazole (PRILOSEC) 20 MG capsule Take 20 mg by mouth daily.   Yes Historical Provider, MD  cephALEXin (KEFLEX) 500 MG capsule Take 1 capsule (500 mg total) by mouth 4 (four) times daily. 09/18/15   Delsa Grana, PA-C  cyclobenzaprine (FLEXERIL) 5 MG tablet Take 1 tablet (5 mg total) by mouth 2 (two) times daily as needed for muscle spasms. 09/18/15   Delsa Grana, PA-C  ondansetron (ZOFRAN ODT) 4 MG disintegrating tablet Take 1 tablet (4 mg total) by mouth every 8 (eight) hours as needed for nausea or vomiting. 09/18/15   Delsa Grana, PA-C  traMADol (ULTRAM) 50 MG tablet Take 1 tablet (50 mg total) by mouth every 12 (twelve) hours as needed. 09/18/15   Delsa Grana, PA-C   BP 143/77 mmHg  Pulse 69  Temp(Src) 98.3 F (36.8 C) (Oral)  Resp 16  Wt 57.153 kg  SpO2 93% Physical Exam  Constitutional: She is oriented to person, place, and time. Vital signs are normal. She appears well-developed and well-nourished. She is cooperative.  Non-toxic appearance. She does not have a sickly appearance. No distress.  Well-appearing elderly female, no acute distress, patient appears comfortable  HENT:  Head: Normocephalic and atraumatic.  Nose: Nose normal.  Mouth/Throat: Oropharynx is clear and moist. No oropharyngeal exudate.  Eyes: Conjunctivae and EOM are normal. Pupils are equal, round, and reactive to light. Right eye exhibits no discharge. Left eye exhibits no discharge. No scleral icterus.  Neck: Normal range of motion. No JVD present. No tracheal deviation present. No thyromegaly present.  Cardiovascular: Normal rate, regular rhythm, normal heart sounds and intact distal pulses.  Exam reveals no gallop  and no friction rub.   No murmur heard. Pulmonary/Chest: Effort normal and breath sounds normal. No accessory muscle usage. No respiratory distress. She has no decreased breath sounds. She has no wheezes. She has no rhonchi. She has no rales. She exhibits no tenderness.  Abdominal: Soft. Normal appearance and bowel sounds are normal. She exhibits no distension and no mass. There is no tenderness. There is no rigidity, no rebound, no guarding, no CVA tenderness, no tenderness at McBurney's point and negative Murphy's sign.  Musculoskeletal: Normal range of motion. She exhibits no edema or tenderness.       Cervical back: Normal. She exhibits normal range of motion, no tenderness and no bony tenderness.       Thoracic back: Normal. She exhibits normal range of motion, no tenderness and no bony tenderness.       Lumbar back: Normal. She exhibits normal range of motion, no tenderness and no bony tenderness.  No midline tenderness, no step-offs. No muscle spasm palpated,  no tenderness to palpation, normal range of motion  Lymphadenopathy:    She has no cervical adenopathy.  Neurological: She is alert and oriented to person, place, and time. She has normal reflexes. She displays no atrophy. No cranial nerve deficit or sensory deficit. She exhibits normal muscle tone. Coordination normal.  Normal sensation in all extremities to light touch, 5/5 strength in lower extremities there is flexion and plantar flexion  Skin: Skin is warm and dry. No rash noted. She is not diaphoretic. No erythema. No pallor.  Psychiatric: She has a normal mood and affect. Her behavior is normal. Judgment and thought content normal.  Nursing note and vitals reviewed.   ED Course  Procedures (including critical care time) Labs Review Labs Reviewed  URINALYSIS, ROUTINE W REFLEX MICROSCOPIC (NOT AT [facility]) - Abnormal; Notable for the following:    APPearance CLOUDY (*)    Nitrite POSITIVE (*)    Leukocytes, UA MODERATE (*)     All other components within normal limits  URINE MICROSCOPIC-ADD ON - Abnormal; Notable for the following:    Squamous Epithelial / LPF 0-5 (*)    Bacteria, UA MANY (*)    All other components within normal limits  URINE CULTURE    Imaging Review Dg Lumbar Spine Complete  09/18/2015  CLINICAL DATA:  Back pain post lifting a recliner last Tuesday EXAM: [facility] 4+ VIEW COMPARISON:  08/24/2011 FINDINGS: Five views of the lumbar spine submitted. There is disc space flattening with endplate sclerotic changes at L5-S1 level. Facet degenerative changes are noted at L5 level. Atherosclerotic calcifications of the abdominal aorta and iliac arteries are noted. There is mild compression deformity upper endplate of 624THL vertebral body. Moderate compression deformity upper endplate of [id number] vertebral body. This are of indeterminate age. Clinical correlation is necessary. If recent fractures are suspected further correlation with MRI recommended. IMPRESSION: Degenerative changes at L5-S1 level. Facet degenerative changes at L5 level.There is mild compression deformity upper endplate of 624THL vertebral body. Moderate compression deformity upper endplate of [id number] vertebral body. This are of indeterminate age. Clinical correlation is necessary. If recent fractures are suspected further correlation with MRI recommended. Electronically Signed   By: Lahoma Crocker M.D.   On: 09/18/2015 13:40   Dg Abd 2 Views  09/18/2015  CLINICAL DATA:  82 year old female with a history of abdominal distention and constipation. EXAM: ABDOMEN - 2 VIEW COMPARISON:  08/24/2011, CT 02/04/2011 FINDINGS: Gas within small bowel and colon. No abnormally distended small bowel or colon. No air-fluid levels identified on the upright image. Surgical changes of cholecystectomy. No unexpected radiopaque foreign body. No unexpected calcification or soft tissue density. No displaced fracture identified. Degenerative changes of the spine. IMPRESSION:  Nonobstructive bowel gas pattern. Cholecystectomy. Signed, Dulcy Fanny. Earleen Newport, DO [facility] Electronically Signed   By: Corrie Mckusick D.O.   On: 09/18/2015 11:22   I have personally reviewed and evaluated these images and lab results as part of my medical decision-making.   EKG Interpretation None      MDM   82 year old female with complaints of constipation and back pain When asking about her back pain she points to her right flank, history reports it is worsened with positional changes, it is not reproducible on exam with palpation, she has no midline spinal tenderness.  She also complains of acute on chronic constipation, and no bowel movements despite MiraLAX, glycerin suppositories and enemas. She is passing gas and has not vomited however  she states that she is nauseated and has had decreased appetite. On exam abdomen is soft, nontender, bowel sounds 4.  I discussed with the patient obtaining abdominal films to evaluate gas bowel patterns, she was in agreement.  We also discussed attempting to use soap suds enema for bowel relief.  I explained to the patient that I did not feel imaging of her lumbar spine was necessary, They would be able to see some of the spine on the abdominal x-rays.  In his concern for cauda equina, the patient had no red flags, no incontinence, numbness, tingling.  Since the patient presented for both back pain and constipation, I explained the patient that I did not want to give her pain medication for her back which would likely worsen her constipation symptom. She was given a muscle relaxer while in the ER.  She was prescribed Mobic by her PCP.  The workup was pertinent for a urinalysis consistent with UTI with positive nitrites and positive leukocytes.  Patient may have early pyelonephritis with right flank pain, nausea. She is tolerating PO's, and vital signs are stable, feel she can be safely treated  outpatient.    The case is discussed with my attending physician, Dr. Tamera Punt, who agreed with diagnosis and treatment of UTI, and patient discussed with Dr. Tamera Punt that she did not feel subsided enema was necessary today.  The patient would like to discharge home and increase her MiraLAX use until she has soft stools.  Dr. Tamera Punt did advise that a lumbar spinal films should be obtained since the patient's PCP was seeking them. This was added to the workup, and results were discharged home with the patient who is accompanied by her son. She was discharged in good condition with stable vital signs.  She has follow-up scheduled on Tuesday, 3 days from now.  Filed Vitals:   09/18/15 1140 09/18/15 1439  BP: 171/83 143/77  Pulse: 80 69  Temp:  98.3 F (36.8 C)  Resp: 16 16     Final diagnoses:  Right-sided low back pain without sciatica  UTI (lower urinary tract infection)  Constipation, unspecified constipation type      Delsa Grana, PA-C 09/18/15 [place], MD 09/19/15 [phone]

## 2015-09-18 NOTE — ED Notes (Signed)
Patient transported to X-ray 

## 2015-09-18 NOTE — ED Notes (Signed)
Arrives POV, reports back injury last Tuesday with continued back pain, also sts no BM since last Tuesday.  C/o nausea with no vomiting, no F/D, A/O X4, ambulatory and in NAD

## 2015-09-19 ENCOUNTER — Emergency Department (HOSPITAL_COMMUNITY)
Admission: EM | Admit: 2015-09-19 | Discharge: 2015-09-19 | Disposition: A | Discharge status: Home / Self Care | Payer: Commercial Managed Care - HMO | Attending: Emergency Medicine | Admitting: Emergency Medicine

## 2015-09-19 ENCOUNTER — Encounter (HOSPITAL_COMMUNITY): Payer: Self-pay

## 2015-09-19 ENCOUNTER — Emergency Department (HOSPITAL_COMMUNITY): Payer: Commercial Managed Care - HMO

## 2015-09-19 DIAGNOSIS — E079 Disorder of thyroid, unspecified: Secondary | ICD-10-CM | POA: Insufficient documentation

## 2015-09-19 DIAGNOSIS — Z79899 Other long term (current) drug therapy: Secondary | ICD-10-CM | POA: Insufficient documentation

## 2015-09-19 DIAGNOSIS — M5124 Other intervertebral disc displacement, thoracic region: Secondary | ICD-10-CM | POA: Diagnosis not present

## 2015-09-19 DIAGNOSIS — Z87442 Personal history of urinary calculi: Secondary | ICD-10-CM | POA: Insufficient documentation

## 2015-09-19 DIAGNOSIS — R0602 Shortness of breath: Secondary | ICD-10-CM | POA: Diagnosis not present

## 2015-09-19 DIAGNOSIS — Y9289 Other specified places as the place of occurrence of the external cause: Secondary | ICD-10-CM | POA: Diagnosis not present

## 2015-09-19 DIAGNOSIS — Y998 Other external cause status: Secondary | ICD-10-CM | POA: Diagnosis not present

## 2015-09-19 DIAGNOSIS — X58XXXA Exposure to other specified factors, initial encounter: Secondary | ICD-10-CM | POA: Diagnosis not present

## 2015-09-19 DIAGNOSIS — S22070A Wedge compression fracture of T9-T10 vertebra, initial encounter for closed fracture: Secondary | ICD-10-CM | POA: Diagnosis not present

## 2015-09-19 DIAGNOSIS — Y9389 Activity, other specified: Secondary | ICD-10-CM | POA: Insufficient documentation

## 2015-09-19 DIAGNOSIS — Z87891 Personal history of nicotine dependence: Secondary | ICD-10-CM | POA: Insufficient documentation

## 2015-09-19 DIAGNOSIS — Z951 Presence of aortocoronary bypass graft: Secondary | ICD-10-CM | POA: Insufficient documentation

## 2015-09-19 DIAGNOSIS — S29002A Unspecified injury of muscle and tendon of back wall of thorax, initial encounter: Secondary | ICD-10-CM | POA: Diagnosis present

## 2015-09-19 DIAGNOSIS — Z7982 Long term (current) use of aspirin: Secondary | ICD-10-CM | POA: Insufficient documentation

## 2015-09-19 DIAGNOSIS — R42 Dizziness and giddiness: Secondary | ICD-10-CM | POA: Insufficient documentation

## 2015-09-19 DIAGNOSIS — I509 Heart failure, unspecified: Secondary | ICD-10-CM | POA: Insufficient documentation

## 2015-09-19 DIAGNOSIS — K219 Gastro-esophageal reflux disease without esophagitis: Secondary | ICD-10-CM | POA: Diagnosis not present

## 2015-09-19 DIAGNOSIS — R52 Pain, unspecified: Secondary | ICD-10-CM

## 2015-09-19 DIAGNOSIS — Z91040 Latex allergy status: Secondary | ICD-10-CM | POA: Insufficient documentation

## 2015-09-19 DIAGNOSIS — I10 Essential (primary) hypertension: Secondary | ICD-10-CM | POA: Insufficient documentation

## 2015-09-19 LAB — BASIC METABOLIC PANEL
Anion gap: 10 (ref 5–15)
BUN: 18 mg/dL (ref 6–20)
CO2: 26 mmol/L (ref 22–32)
Calcium: 8.6 mg/dL — ABNORMAL LOW (ref 8.9–10.3)
Chloride: 103 mmol/L (ref 101–111)
Creatinine, Ser: 1.09 mg/dL — ABNORMAL HIGH (ref 0.44–1.00)
GFR calc Af Amer: 52 mL/min — ABNORMAL LOW (ref >60)
GFR calc non Af Amer: 45 mL/min — ABNORMAL LOW (ref >60)
Glucose, Bld: 92 mg/dL (ref 65–99)
Potassium: 3.6 mmol/L (ref 3.5–5.1)
Sodium: 139 mmol/L (ref 135–145)

## 2015-09-19 LAB — I-STAT TROPONIN, ED: Troponin i, poc: 0.01 ng/mL (ref 0.00–0.08)

## 2015-09-19 LAB — CBC
HCT: 43.1 % (ref 36.0–46.0)
Hemoglobin: 13.1 g/dL (ref 12.0–15.0)
MCH: 27.6 pg (ref 26.0–34.0)
MCHC: 30.4 g/dL (ref 30.0–36.0)
MCV: 90.7 fL (ref 78.0–100.0)
Platelets: 218 10*3/uL (ref 150–400)
RBC: 4.75 MIL/uL (ref 3.87–5.11)
RDW: 13.7 % (ref 11.5–15.5)
WBC: 10.4 10*3/uL (ref 4.0–10.5)

## 2015-09-19 MED ORDER — HYDROCODONE-ACETAMINOPHEN 5-325 MG PO TABS: 1.0000 | ORAL_TABLET | Freq: Four times a day (QID) | ORAL | Status: DC | PRN

## 2015-09-19 MED ORDER — LORAZEPAM 2 MG/ML IJ SOLN: 0.5000 mg | INTRAMUSCULAR | Status: DC | PRN

## 2015-09-19 MED ORDER — PREDNISONE 20 MG PO TABS: 40.0000 mg | ORAL_TABLET | Freq: Every day | ORAL | Status: AC

## 2015-09-19 MED ORDER — PREDNISONE 20 MG PO TABS
40.0000 mg | ORAL_TABLET | ORAL | Status: AC
  Administered 2015-09-19: 40 mg via ORAL
  Filled 2015-09-19: qty 2

## 2015-09-19 NOTE — ED Notes (Signed)
Pt is still in MRI, will continue to check

## 2015-09-19 NOTE — ED Notes (Signed)
Pt was seen yesterday for back pain, dx:  UTI.  Onset today pt felt like band was wrapping around abd and chest, this pain has resolved.  Pt having shortness of breath.  Talking in complete sentences.  Pt took Ultram PTA.

## 2015-09-19 NOTE — ED Notes (Signed)
Patient transported to MRI 

## 2015-09-19 NOTE — ED Provider Notes (Signed)
CSN: [id number]     Arrival date & time 09/19/15  1454 History   First MD Initiated Contact with Patient 09/19/15 1705     Chief Complaint  Patient presents with  . Shortness of Breath     (Consider location/radiation/quality/duration/timing/severity/associated sxs/prior Treatment) HPI Patient presents with concern of ongoing lower thoracic back pain, and one episode of dyspnea. Patient had minor trauma 3 days ago. Since that time she has had pain focally in the mid lower back. The pain has had multiple exacerbations, both spontaneously, and with motion. The pain is severe, when it is most pronounced, but at baseline the pain is moderate. Pain radiates circumferentially, bilaterally, with exacerbations. Today, just prior to ED arrival she had an exacerbation, with severe pain, dyspnea, lightheadedness. Episode occurred at rest. Patient was seen here yesterday for these concerns, had x-ray chest, lumbar spine. Patient states that she was told she had possible muscle spasm, she was started on multiple medications, for pain relief, muscle spasm. In spite of significant medications, today's exacerbation occurred.  Past Medical History  Diagnosis Date  . Breast lump   . Thyroid disease   . GERD (gastroesophageal reflux disease)   . CHF (congestive heart failure) ([place])   . Arrhythmia 02/06/2012    hx of paroxysmal a fib with no recurrence,rare palpations  . Hypertension 2013    good control  . Kidney stones     history of   Past Surgical History  Procedure Laterality Date  . Abdominal hysterectomy    . Coronary artery bypass graft    . Supraventricular tachycardia ablation    . Nm myocar perf wall motion  Feb 19 2008    negative for ischemia   Family History  Problem Relation Age of Onset  . Cancer Mother   . Stroke Father   . Cancer Brother    Social History  Substance Use Topics  . Smoking status: Former Research scientist (life sciences)  . Smokeless tobacco: None  . Alcohol Use: No   OB  History    No data available     Review of Systems  Constitutional:       Per HPI, otherwise negative  HENT:       Per HPI, otherwise negative  Respiratory:       Per HPI, otherwise negative  Cardiovascular:       Per HPI, otherwise negative  Gastrointestinal: Negative for vomiting.  Endocrine:       Negative aside from HPI  Genitourinary:       Neg aside from HPI   Musculoskeletal:       Per HPI, otherwise negative  Skin: Negative.   Neurological: Positive for weakness and light-headedness. Negative for syncope.      Allergies  Latex and Sulfa antibiotics  Home Medications   Prior to Admission medications   Medication Sig Start Date End Date Taking? Authorizing Provider  aspirin EC 81 MG tablet Take 1 tablet (81 mg total) by mouth daily. Patient taking differently: Take 81 mg by mouth every other day.  02/18/14  Yes Mihai Croitoru, MD  calcium carbonate (TUMS EX) 750 MG chewable tablet Chew 2-3 tablets by mouth at bedtime.    Yes Historical Provider, MD  cephALEXin (KEFLEX) 500 MG capsule Take 1 capsule (500 mg total) by mouth 4 (four) times daily. Patient taking differently: Take 500 mg by mouth 4 (four) times daily. 7 day course started 09/19/15 09/18/15  Yes Delsa Grana, PA-C  Cholecalciferol (VITAMIN D-3) 5000 UNITS TABS Take 10,000  tablets by mouth every 7 (seven) days. On Fridays   Yes Historical Provider, MD  cyclobenzaprine (FLEXERIL) 5 MG tablet Take 1 tablet (5 mg total) by mouth 2 (two) times daily as needed for muscle spasms. 09/18/15  Yes Leisa Tapia, PA-C  DILT-XR 180 MG 24 hr capsule TAKE 1 CAPSULE (180 MG TOTAL) BY MOUTH DAILY. Patient taking differently: TAKE 1 CAPSULE (180 MG TOTAL) BY MOUTH DAILY AT BEDTIME 06/15/15  Yes Mihai Croitoru, MD  levothyroxine (SYNTHROID, LEVOTHROID) 50 MCG tablet Take 50 mcg by mouth daily before breakfast.   Yes Historical Provider, MD  meloxicam (MOBIC) 7.5 MG tablet Take 7.5 mg by mouth 2 (two) times daily as needed for pain.   09/15/15  Yes Historical Provider, MD  Multiple Vitamins-Minerals (PRESERVISION AREDS PO) Take 1 capsule by mouth 2 (two) times daily.    Yes Historical Provider, MD  omeprazole (PRILOSEC) 20 MG capsule Take 20 mg by mouth daily with lunch.    Yes Historical Provider, MD  traMADol (ULTRAM) 50 MG tablet Take 1 tablet (50 mg total) by mouth every 12 (twelve) hours as needed. Patient taking differently: Take 50 mg by mouth every 12 (twelve) hours as needed (pain).  09/18/15  Yes Delsa Grana, PA-C  ondansetron (ZOFRAN ODT) 4 MG disintegrating tablet Take 1 tablet (4 mg total) by mouth every 8 (eight) hours as needed for nausea or vomiting. Patient not taking: Reported on 09/19/2015 09/18/15   Delsa Grana, PA-C   BP 145/65 mmHg  Pulse 73  Temp(Src) 97.8 F (36.6 C) (Oral)  Resp 16  Ht 5\' 2"  (1.575 m)  Wt 129 lb 11.2 oz (58.832 kg)  BMI 23.72 kg/m2  SpO2 93% Physical Exam  Constitutional: She is oriented to person, place, and time. She has a sickly appearance. No distress.  HENT:  Head: Normocephalic and atraumatic.  Eyes: Conjunctivae and EOM are normal.  Cardiovascular: Normal rate and regular rhythm.   Pulmonary/Chest: Effort normal and breath sounds normal. No stridor. No respiratory distress.  Abdominal: She exhibits no distension. There is no tenderness.  Musculoskeletal: She exhibits no edema.  Neurological: She is alert and oriented to person, place, and time. She displays no atrophy and no tremor. No cranial nerve deficit or sensory deficit. She exhibits normal muscle tone. She displays no seizure activity. Coordination normal.  Skin: Skin is warm and dry.  Psychiatric: She has a normal mood and affect.  Nursing note and vitals reviewed.   ED Course  Procedures (including critical care time) Labs Review Labs Reviewed  BASIC METABOLIC PANEL - Abnormal; Notable for the following:    Creatinine, Ser 1.09 (*)    Calcium 8.6 (*)    GFR calc non Af Amer 45 (*)    GFR calc Af Amer 52  (*)    All other components within normal limits  CBC  I-STAT TROPOININ, ED    Imaging Review Dg Chest 2 View  09/19/2015  CLINICAL DATA:  Chest tightness and shortness of Breath EXAM: CHEST  2 VIEW COMPARISON:  05/23/2010 FINDINGS: Cardiac shadow is within normal limits. The lungs are well aerated bilaterally. No focal infiltrate or sizable effusion is seen. Compression deformity is noted in the lower thoracic spine at T12 which is stable in appearance from the prior exam. A new T10 compression deformity is noted from the prior exam but of uncertain chronicity. IMPRESSION: T10 compression deformity new from the prior exam but of uncertain chronicity. No other focal abnormality is noted. Electronically Signed  By: Inez Catalina M.D.   On: 09/19/2015 16:19   Dg Lumbar Spine Complete  09/18/2015  CLINICAL DATA:  Back pain post lifting a recliner last Tuesday EXAM: [place] 4+ VIEW COMPARISON:  08/24/2011 FINDINGS: Five views of the lumbar spine submitted. There is disc space flattening with endplate sclerotic changes at L5-S1 level. Facet degenerative changes are noted at L5 level. Atherosclerotic calcifications of the abdominal aorta and iliac arteries are noted. There is mild compression deformity upper endplate of 624THL vertebral body. Moderate compression deformity upper endplate of QA348G vertebral body. This are of indeterminate age. Clinical correlation is necessary. If recent fractures are suspected further correlation with MRI recommended. IMPRESSION: Degenerative changes at L5-S1 level. Facet degenerative changes at L5 level.There is mild compression deformity upper endplate of 624THL vertebral body. Moderate compression deformity upper endplate of QA348G vertebral body. This are of indeterminate age. Clinical correlation is necessary. If recent fractures are suspected further correlation with MRI recommended. Electronically Signed   By: Lahoma Crocker M.D.   On: 09/18/2015 13:40   Mr Thoracic  Spine Wo Contrast  09/19/2015  CLINICAL DATA:  Initial evaluation for acute mid to low back pain for 1 week. EXAM: MRI THORACIC SPINE WITHOUT CONTRAST TECHNIQUE: Multiplanar, multisequence MR imaging of the thoracic spine was performed. No intravenous contrast was administered. COMPARISON:  Prior radiograph from earlier the same day. FINDINGS: Scattered multilevel degenerative spondylolysis noted within the visualized cervical spine with reversal of the normal cervical lordosis, incompletely evaluated on this exam. Vertebral bodies are normally aligned with preservation of the normal thoracic kyphosis. No listhesis or malalignment. There is an acute appearing compression fracture of the T10 vertebral body with up to 60% of height loss with approximately 4-5 mm of bony retropulsion. Resultant mild canal narrowing at this level. No other fracture or acute abnormality identified within the thoracic spine. Signal intensity within the thoracic spinal cord is normal. Paraspinous soft tissues demonstrate no acute abnormality. Mild atelectatic changes noted within the visualized lungs. Large 4 cm cyst noted within the left kidney. Additional scattered smaller renal cysts noted. Subcentimeter T2 hyperintense lesion partially visualized within the right hepatic lobe may reflect a small cyst is well, incompletely visualized. T1-2:  Negative. T2-3:  Negative. T3-4: Tiny central disc protrusion minimally indents the ventral thecal sac without stenosis. T4-5: Small central disc protrusion indents the ventral thecal sac without significant stenosis. T5-6:  Tiny central disc protrusion without significant stenosis. T6-7:  Tiny central disc protrusion without stenosis. T7-8:  Negative. T8-9:  Tiny central disc protrusion without stenosis. T9-10:  Negative. T10-11: 4-5 mm of bony retropulsion related to the compression fracture. Superimposed posterior element hypertrophy. Resultant mild canal stenosis. Mild disc bulge at T10-11.  T11-12: Mild facet hypertrophy with minimal disc bulge. No stenosis. T12-L1:  Mild diffuse disc bulge without stenosis. IMPRESSION: 1. Acute compression fracture of the T10 vertebral body with up to 60% height loss and 4-5 mm bony retropulsion. There is resultant mild canal stenosis at this level. 2. No other acute abnormality within the thoracic spine. 3. Relatively mild for age multilevel degenerative spondylolysis as above. No other significant stenosis. Electronically Signed   By: Jeannine Boga M.D.   On: 09/19/2015 19:40   Dg Abd 2 Views  09/18/2015  CLINICAL DATA:  82 year old female with a history of abdominal distention and constipation. EXAM: ABDOMEN - 2 VIEW COMPARISON:  08/24/2011, CT 02/04/2011 FINDINGS: Gas within small bowel and colon. No abnormally distended small bowel or colon.  No air-fluid levels identified on the upright image. Surgical changes of cholecystectomy. No unexpected radiopaque foreign body. No unexpected calcification or soft tissue density. No displaced fracture identified. Degenerative changes of the spine. IMPRESSION: Nonobstructive bowel gas pattern. Cholecystectomy. Signed, Dulcy Fanny. Earleen Newport, DO [facility] Electronically Signed   By: Corrie Mckusick D.O.   On: 09/18/2015 11:22   I have personally reviewed and evaluated these images and lab results as part of my medical decision-making.   EKG Interpretation   Date/Time:  Sunday September 19 2015 15:15:45 EDT Ventricular Rate:  85 PR Interval:  140 QRS Duration: 82 QT Interval:  368 QTC Calculation: 437 R Axis:   23 Text Interpretation:  Normal sinus rhythm Normal ECG Sinus rhythm Normal  ECG Confirmed by Carmin Muskrat  MD [phone]) on 09/19/2015 5:54:05 PM     Chart review notable for evaluation yesterday, with also compression fractures identified on x-ray. Today, x-ray performed at triage showed a compression fracture, though in a different  location. Given the patient's description of ongoing severe exacerbations, MRI will be performed to further characterize likely fractures.  8:59 PM Patient in no distress. I demonstrated the MRI images to the patient and her son. We discussed the compression fracture, inflammatory changes surrounding it. I also demonstrated the renal cyst. We discussed changing the patient's medication, for more pain control, and following up with neurosurgery. MDM  Patient presents several days after minor trauma that resulted in new back pain. Here, the patient is awake and alert, with no evidence for respiratory compromise, and her symptoms are likely due to the identification of compression fracture in the thoracic spine. No evidence for neurologic dysfunction. Patient started on a course of anti-inflammatory, increased analgesia, will follow up with spine specialist.  Carmin Muskrat, MD 09/19/15 2100

## 2015-09-19 NOTE — Discharge Instructions (Signed)
As discussed, your pain is likely due to the compression fracture of the 10th thoracic vertebrae.    Please take all medication as directed, and be sure to follow-up with our affiliated specialists.    Return here for concerning changes in your condition.

## 2015-09-21 LAB — URINE CULTURE
Culture: >=100000
Special Requests: NORMAL

## 2015-09-22 ENCOUNTER — Telehealth (HOSPITAL_BASED_OUTPATIENT_CLINIC_OR_DEPARTMENT_OTHER): Payer: Self-pay

## 2015-09-22 NOTE — Telephone Encounter (Signed)
Post ED Visit - Positive Culture Follow-up  Culture report reviewed by antimicrobial stewardship pharmacist:  []  Elenor Quinones, Pharm.D. []  Heide Guile, Pharm.D., BCPS []  Parks Neptune, Pharm.D. []  Alycia Rossetti, Pharm.D., BCPS []  Baker, Pharm.D., BCPS, AAHIVP []  Legrand Como, Pharm.D., BCPS, AAHIVP []  Milus Glazier, Pharm.D. []  Stephens November, Pharm.D. Magan Decker Pharm D. Positive urine culture Treated with Cephalexin, organism sensitive to the same and no further patient follow-up is required at this time.  Genia Del 09/22/2015, 10:50 AM

## 2015-09-27 DIAGNOSIS — M546 Pain in thoracic spine: Secondary | ICD-10-CM | POA: Diagnosis not present

## 2015-09-27 DIAGNOSIS — F1120 Opioid dependence, uncomplicated: Secondary | ICD-10-CM | POA: Diagnosis not present

## 2015-09-27 DIAGNOSIS — Z79899 Other long term (current) drug therapy: Secondary | ICD-10-CM | POA: Diagnosis not present

## 2015-09-27 DIAGNOSIS — S22000D Wedge compression fracture of unspecified thoracic vertebra, subsequent encounter for fracture with routine healing: Secondary | ICD-10-CM | POA: Diagnosis not present

## 2015-10-11 DIAGNOSIS — B079 Viral wart, unspecified: Secondary | ICD-10-CM | POA: Diagnosis not present

## 2015-10-11 DIAGNOSIS — D485 Neoplasm of uncertain behavior of skin: Secondary | ICD-10-CM | POA: Diagnosis not present

## 2015-10-14 DIAGNOSIS — F1120 Opioid dependence, uncomplicated: Secondary | ICD-10-CM | POA: Diagnosis not present

## 2015-10-14 DIAGNOSIS — M546 Pain in thoracic spine: Secondary | ICD-10-CM | POA: Diagnosis not present

## 2015-10-14 DIAGNOSIS — S22000D Wedge compression fracture of unspecified thoracic vertebra, subsequent encounter for fracture with routine healing: Secondary | ICD-10-CM | POA: Diagnosis not present

## 2015-10-19 DIAGNOSIS — S22000D Wedge compression fracture of unspecified thoracic vertebra, subsequent encounter for fracture with routine healing: Secondary | ICD-10-CM | POA: Diagnosis not present

## 2015-11-08 DIAGNOSIS — J018 Other acute sinusitis: Secondary | ICD-10-CM | POA: Diagnosis not present

## 2015-12-13 DIAGNOSIS — S22000D Wedge compression fracture of unspecified thoracic vertebra, subsequent encounter for fracture with routine healing: Secondary | ICD-10-CM | POA: Diagnosis not present

## 2015-12-14 ENCOUNTER — Telehealth: Payer: Self-pay | Admitting: Cardiovascular Disease

## 2015-12-14 NOTE — Telephone Encounter (Signed)
Pt calling to make her August appt in September so she can come with her husband Gwyndolyn Saxon [birth year]24 who has a device and is due in September-problem is that [facility] doesn't have a device and with Dr. Lurline Del schedule for device patient on Tuesday's doesn't allow for established patients-she wanted to ask if we can accommodate her request if not they will come separate- pls advise

## 2015-12-15 DIAGNOSIS — M810 Age-related osteoporosis without current pathological fracture: Secondary | ICD-10-CM | POA: Diagnosis not present

## 2015-12-15 DIAGNOSIS — M8588 Other specified disorders of bone density and structure, other site: Secondary | ICD-10-CM | POA: Diagnosis not present

## 2015-12-15 DIAGNOSIS — Z1231 Encounter for screening mammogram for malignant neoplasm of breast: Secondary | ICD-10-CM | POA: Diagnosis not present

## 2015-12-17 NOTE — Telephone Encounter (Signed)
Returned call to patient to schedule their appointments. Patient is scheduled for 03/03/16 at 8:30a. Jill Mcdowell is scheduled for the same day at 8:45a.

## 2015-12-28 NOTE — Telephone Encounter (Signed)
That's good

## 2016-01-21 DIAGNOSIS — E559 Vitamin D deficiency, unspecified: Secondary | ICD-10-CM | POA: Diagnosis not present

## 2016-01-21 DIAGNOSIS — M810 Age-related osteoporosis without current pathological fracture: Secondary | ICD-10-CM | POA: Diagnosis not present

## 2016-01-21 DIAGNOSIS — E039 Hypothyroidism, unspecified: Secondary | ICD-10-CM | POA: Diagnosis not present

## 2016-01-21 DIAGNOSIS — D509 Iron deficiency anemia, unspecified: Secondary | ICD-10-CM | POA: Diagnosis not present

## 2016-01-21 DIAGNOSIS — E7800 Pure hypercholesterolemia, unspecified: Secondary | ICD-10-CM | POA: Diagnosis not present

## 2016-01-26 DIAGNOSIS — L723 Sebaceous cyst: Secondary | ICD-10-CM | POA: Diagnosis not present

## 2016-01-26 DIAGNOSIS — D485 Neoplasm of uncertain behavior of skin: Secondary | ICD-10-CM | POA: Diagnosis not present

## 2016-01-26 DIAGNOSIS — L570 Actinic keratosis: Secondary | ICD-10-CM | POA: Diagnosis not present

## 2016-02-10 DIAGNOSIS — Z0100 Encounter for examination of eyes and vision without abnormal findings: Secondary | ICD-10-CM | POA: Diagnosis not present

## 2016-02-10 DIAGNOSIS — Z961 Presence of intraocular lens: Secondary | ICD-10-CM | POA: Diagnosis not present

## 2016-03-03 ENCOUNTER — Encounter: Payer: Self-pay | Admitting: Cardiovascular Disease

## 2016-03-03 ENCOUNTER — Ambulatory Visit (INDEPENDENT_AMBULATORY_CARE_PROVIDER_SITE_OTHER): Payer: Commercial Managed Care - HMO | Admitting: Cardiovascular Disease

## 2016-03-03 VITALS — BP 110/60 | HR 73 | Ht 62.0 in | Wt 122.8 lb

## 2016-03-03 DIAGNOSIS — I480 Paroxysmal atrial fibrillation: Secondary | ICD-10-CM

## 2016-03-03 NOTE — Patient Instructions (Signed)
Dr Croitoru recommends that you schedule a follow-up appointment in 12 months. You will receive a reminder letter in the mail two months in advance. If you don't receive a letter, please call our office to schedule the follow-up appointment.  If you need a refill on your cardiac medications before your next appointment, please call your pharmacy. 

## 2016-03-03 NOTE — Progress Notes (Signed)
Cardiology Office Note    Date:  03/03/2016   ID:  GEROLDINE GOODNO, DOB 1926/11/02, MRN [id number]  PCP:  Gerrit Heck, MD  Cardiologist:   Sanda Klein, MD   Chief Complaint  Patient presents with  . Follow-up    History of Present Illness:  Jill Mcdowell is a 82 y.o. female a remote history of a single episode of atrial fibrillation (reportedly occurred in the 1990s, no firm documentation available). She has not had recurrent atrial fibrillation, but has rare and very brief episodes of palpitations lasting 2 or 3 seconds. She does not have any significant structural heart disease. His last appointment palpitations have been very rare. She denies shortness of breath or chest pain at rest or with activity. She has not had syncope or dizziness. She did have a fall complicated by a vertebral compression fracture in March 2017. She has not had other falls, neurological events or bleeding problems.    Past Medical History:  Diagnosis Date  . Arrhythmia 02/06/2012   hx of paroxysmal a fib with no recurrence,rare palpations  . Breast lump   . CHF (congestive heart failure) ([place])   . GERD (gastroesophageal reflux disease)   . Hypertension 2013   good control  . Kidney stones    history of  . Thyroid disease     Past Surgical History:  Procedure Laterality Date  . ABDOMINAL HYSTERECTOMY    . CORONARY ARTERY BYPASS GRAFT    . NM MYOCAR PERF WALL MOTION  Feb 19 2008   negative for ischemia  . SUPRAVENTRICULAR TACHYCARDIA ABLATION      Current Medications: Outpatient Medications Prior to Visit  Medication Sig Dispense Refill  . aspirin EC 81 MG tablet Take 1 tablet (81 mg total) by mouth daily. (Patient taking differently: Take 81 mg by mouth every other day. ) 90 tablet 3  . Cholecalciferol (VITAMIN D-3) 5000 UNITS TABS Take 10,000 tablets by mouth every 7 (seven) days. On Fridays    . HYDROcodone-acetaminophen (NORCO/VICODIN) 5-325 MG tablet Take 1 tablet by mouth  every 6 (six) hours as needed for severe pain. 10 tablet 0  . levothyroxine (SYNTHROID, LEVOTHROID) 50 MCG tablet Take 50 mcg by mouth daily before breakfast.    . Multiple Vitamins-Minerals (PRESERVISION AREDS PO) Take 1 capsule by mouth 2 (two) times daily.     [name] omeprazole (PRILOSEC) 20 MG capsule Take 20 mg by mouth daily with lunch.     . calcium carbonate (TUMS EX) 750 MG chewable tablet Chew 2-3 tablets by mouth at bedtime.     . cephALEXin (KEFLEX) 500 MG capsule Take 1 capsule (500 mg total) by mouth 4 (four) times daily. (Patient not taking: Reported on 03/03/2016) 28 capsule 0  . DILT-XR 180 MG 24 hr capsule TAKE 1 CAPSULE (180 MG TOTAL) BY MOUTH DAILY. (Patient not taking: Reported on 03/03/2016) 90 capsule 3  . ondansetron (ZOFRAN ODT) 4 MG disintegrating tablet Take 1 tablet (4 mg total) by mouth every 8 (eight) hours as needed for nausea or vomiting. (Patient not taking: Reported on 09/19/2015) 10 tablet 0   No facility-administered medications prior to visit.      Allergies:   Latex and Sulfa antibiotics   Social History   Social History  . Marital status: Married    Spouse name: N/A  . Number of children: N/A  . Years of education: N/A   Social History Main Topics  . Smoking status: Former Research scientist (life sciences)  . Smokeless tobacco:  Not on file  . Alcohol use No  . Drug use: No  . Sexual activity: Not on file   Other Topics Concern  . Not on file   Social History Narrative  . No narrative on file     Family History:  The patient's family history includes Cancer in her brother and mother; Stroke in her father.   ROS:   Please see the history of present illness.    ROS All other systems reviewed and are negative.   PHYSICAL EXAM:   VS:  BP 110/60 (BP Location: Left Arm, Patient Position: Sitting, Cuff Size: Normal)   Pulse 73   Ht 5\' 2"  (1.575 m)   Wt 122 lb 12.8 oz (55.7 kg)   SpO2 95%   BMI 22.46 kg/m    GEN: Well nourished, well developed, in no acute distress    HEENT: normal  Neck: no JVD, carotid bruits, or masses Cardiac: RRR; no murmurs, rubs, or gallops,no edema  Respiratory:  clear to auscultation bilaterally, normal work of breathing GI: soft, nontender, nondistended, + BS MS: no deformity or atrophy  Skin: warm and dry, no rash Neuro:  Alert and Oriented x 3, Strength and sensation are intact Psych: euthymic mood, full affect  Wt Readings from Last 3 Encounters:  03/03/16 122 lb 12.8 oz (55.7 kg)  09/19/15 129 lb 11.2 oz (58.8 kg)  09/18/15 126 lb (57.2 kg)      Studies/Labs Reviewed:   EKG:  EKG is ordered today.  The ekg ordered today demonstrates Sinus rhythm, normal tracing  Recent Labs: 09/19/2015: BUN 18; Creatinine, Ser 1.09; Hemoglobin 13.1; Platelets 218; Potassium 3.6; Sodium 139     ASSESSMENT:    1. Paroxysmal atrial fibrillation (HCC)      PLAN:  In order of problems listed above:  1. Remote PAF event, poorly documented. Anticoagulation seems to bear at least a risk of bleeding equal to its benefit. Continue aspirin only. Palpitations are very well controlled on a low dose of diltiazem. No evidence of structural heart disease by previous workup. Normal nuclear stress test in 2009. Reported history of mitral valve prolapse and previous echo, without physical findings to support this diagnosis and without mitral regurgitation. If conservative management.    Medication Adjustments/Labs and Tests Ordered: Current medicines are reviewed at length with the patient today.  Concerns regarding medicines are outlined above.  Medication changes, Labs and Tests ordered today are listed in the Patient Instructions below. Patient Instructions  Dr Sallyanne Kuster recommends that you schedule a follow-up appointment in 12 months. You will receive a reminder letter in the mail two months in advance. If you don't receive a letter, please call our office to schedule the follow-up appointment.  If you need a refill on your cardiac  medications before your next appointment, please call your pharmacy.    Signed, Sanda Klein, MD  03/03/2016 9:25 AM    [facility] [place], [place], [place]  [address] Phone: [phone]; Fax: [phone]

## 2016-03-14 DIAGNOSIS — H353131 Nonexudative age-related macular degeneration, bilateral, early dry stage: Secondary | ICD-10-CM | POA: Diagnosis not present

## 2016-03-21 ENCOUNTER — Ambulatory Visit: Payer: Commercial Managed Care - HMO | Admitting: Cardiovascular Disease

## 2016-04-11 DIAGNOSIS — Z23 Encounter for immunization: Secondary | ICD-10-CM | POA: Diagnosis not present

## 2016-05-25 DIAGNOSIS — B349 Viral infection, unspecified: Secondary | ICD-10-CM | POA: Diagnosis not present

## 2016-05-25 DIAGNOSIS — R05 Cough: Secondary | ICD-10-CM | POA: Diagnosis not present

## 2016-07-27 ENCOUNTER — Other Ambulatory Visit: Payer: Self-pay | Admitting: Cardiovascular Disease

## 2016-09-07 DIAGNOSIS — J069 Acute upper respiratory infection, unspecified: Secondary | ICD-10-CM | POA: Diagnosis not present

## 2016-10-17 DIAGNOSIS — K219 Gastro-esophageal reflux disease without esophagitis: Secondary | ICD-10-CM | POA: Diagnosis not present

## 2016-10-17 DIAGNOSIS — Z0000 Encounter for general adult medical examination without abnormal findings: Secondary | ICD-10-CM | POA: Diagnosis not present

## 2016-10-17 DIAGNOSIS — E559 Vitamin D deficiency, unspecified: Secondary | ICD-10-CM | POA: Diagnosis not present

## 2016-10-17 DIAGNOSIS — M810 Age-related osteoporosis without current pathological fracture: Secondary | ICD-10-CM | POA: Diagnosis not present

## 2016-10-17 DIAGNOSIS — Z85828 Personal history of other malignant neoplasm of skin: Secondary | ICD-10-CM | POA: Diagnosis not present

## 2016-10-17 DIAGNOSIS — E7800 Pure hypercholesterolemia, unspecified: Secondary | ICD-10-CM | POA: Diagnosis not present

## 2016-10-17 DIAGNOSIS — E039 Hypothyroidism, unspecified: Secondary | ICD-10-CM | POA: Diagnosis not present

## 2016-10-17 DIAGNOSIS — I480 Paroxysmal atrial fibrillation: Secondary | ICD-10-CM | POA: Diagnosis not present

## 2016-10-17 DIAGNOSIS — D509 Iron deficiency anemia, unspecified: Secondary | ICD-10-CM | POA: Diagnosis not present

## 2016-10-25 DIAGNOSIS — D229 Melanocytic nevi, unspecified: Secondary | ICD-10-CM | POA: Diagnosis not present

## 2016-10-25 DIAGNOSIS — L821 Other seborrheic keratosis: Secondary | ICD-10-CM | POA: Diagnosis not present

## 2016-10-25 DIAGNOSIS — L918 Other hypertrophic disorders of the skin: Secondary | ICD-10-CM | POA: Diagnosis not present

## 2016-11-02 DIAGNOSIS — H353132 Nonexudative age-related macular degeneration, bilateral, intermediate dry stage: Secondary | ICD-10-CM | POA: Diagnosis not present

## 2016-11-02 DIAGNOSIS — H43813 Vitreous degeneration, bilateral: Secondary | ICD-10-CM | POA: Diagnosis not present

## 2016-11-03 DIAGNOSIS — D509 Iron deficiency anemia, unspecified: Secondary | ICD-10-CM | POA: Diagnosis not present

## 2016-11-10 DIAGNOSIS — K529 Noninfective gastroenteritis and colitis, unspecified: Secondary | ICD-10-CM | POA: Diagnosis not present

## 2016-12-15 DIAGNOSIS — Z1231 Encounter for screening mammogram for malignant neoplasm of breast: Secondary | ICD-10-CM | POA: Diagnosis not present

## 2017-01-17 DIAGNOSIS — D229 Melanocytic nevi, unspecified: Secondary | ICD-10-CM | POA: Diagnosis not present

## 2017-01-17 DIAGNOSIS — Z85828 Personal history of other malignant neoplasm of skin: Secondary | ICD-10-CM | POA: Diagnosis not present

## 2017-01-17 DIAGNOSIS — L821 Other seborrheic keratosis: Secondary | ICD-10-CM | POA: Diagnosis not present

## 2017-02-22 DIAGNOSIS — H5202 Hypermetropia, left eye: Secondary | ICD-10-CM | POA: Diagnosis not present

## 2017-02-22 DIAGNOSIS — H353132 Nonexudative age-related macular degeneration, bilateral, intermediate dry stage: Secondary | ICD-10-CM | POA: Diagnosis not present

## 2017-02-22 DIAGNOSIS — Z961 Presence of intraocular lens: Secondary | ICD-10-CM | POA: Diagnosis not present

## 2017-02-22 DIAGNOSIS — H26493 Other secondary cataract, bilateral: Secondary | ICD-10-CM | POA: Diagnosis not present

## 2017-03-12 ENCOUNTER — Ambulatory Visit: Payer: Commercial Managed Care - HMO | Admitting: Cardiovascular Disease

## 2017-03-20 DIAGNOSIS — H35372 Puckering of macula, left eye: Secondary | ICD-10-CM | POA: Diagnosis not present

## 2017-03-20 DIAGNOSIS — H43813 Vitreous degeneration, bilateral: Secondary | ICD-10-CM | POA: Diagnosis not present

## 2017-04-18 DIAGNOSIS — Z23 Encounter for immunization: Secondary | ICD-10-CM | POA: Diagnosis not present

## 2017-06-07 ENCOUNTER — Ambulatory Visit: Payer: Commercial Managed Care - HMO | Admitting: Cardiovascular Disease

## 2017-07-02 ENCOUNTER — Other Ambulatory Visit: Payer: Self-pay | Admitting: Cardiovascular Disease

## 2017-07-02 NOTE — Telephone Encounter (Signed)
Rx(s) sent to pharmacy electronically.  

## 2017-07-18 DIAGNOSIS — H35372 Puckering of macula, left eye: Secondary | ICD-10-CM | POA: Diagnosis not present

## 2017-07-18 DIAGNOSIS — H43813 Vitreous degeneration, bilateral: Secondary | ICD-10-CM | POA: Diagnosis not present

## 2017-07-18 DIAGNOSIS — H353132 Nonexudative age-related macular degeneration, bilateral, intermediate dry stage: Secondary | ICD-10-CM | POA: Diagnosis not present

## 2017-07-30 DIAGNOSIS — J069 Acute upper respiratory infection, unspecified: Secondary | ICD-10-CM | POA: Diagnosis not present

## 2017-08-06 DIAGNOSIS — J069 Acute upper respiratory infection, unspecified: Secondary | ICD-10-CM | POA: Diagnosis not present

## 2017-08-10 DIAGNOSIS — J209 Acute bronchitis, unspecified: Secondary | ICD-10-CM | POA: Diagnosis not present

## 2017-08-24 ENCOUNTER — Ambulatory Visit: Payer: Medicare HMO | Admitting: Cardiovascular Disease

## 2017-08-24 ENCOUNTER — Encounter: Payer: Self-pay | Admitting: Cardiovascular Disease

## 2017-08-24 VITALS — BP 136/62 | HR 90 | Ht 61.0 in | Wt 124.8 lb

## 2017-08-24 DIAGNOSIS — I480 Paroxysmal atrial fibrillation: Secondary | ICD-10-CM | POA: Diagnosis not present

## 2017-08-24 DIAGNOSIS — I341 Nonrheumatic mitral (valve) prolapse: Secondary | ICD-10-CM

## 2017-08-24 NOTE — Progress Notes (Signed)
Patient had [place] with cardiology   Cardiology Office Note    Date:  08/24/2017   ID:  Jill Mcdowell, DOB 02-16-1927, MRN [id number]  PCP:  Leighton Ruff, MD  Cardiologist:   Sanda Klein, MD   Chief Complaint  Patient presents with  . Annual Exam    pt reports an occas palpitation, otherwise, no complaints    History of Present Illness:  Jill Mcdowell is a 84 y.o. female a remote history of a single episode of atrial fibrillation (reportedly occurred in the 1990s, no firm documentation available).   She has very infrequent and brief palpitations.  These bothered her during a recent respiratory infection that she is finally getting over.  She has taken cough suppressants, steroids and antibiotics.  She has not had shortness of breath, but mostly upper respiratory problems.  She denies angina, syncope, dizziness, leg edema, claudication, focal neurological complaints or other cardiovascular problems.  She has not had any new falls or other injuries.  She has started taking iron supplements for hypoproteinemia, although she is not frankly anemic.  Recent labs show hemoglobin 13.5, normal liver function tests, TSH 0.85, ferritin 8.,  Total cholesterol 198, HDL 53, LDL 114, triglycerides 153.    Past Medical History:  Diagnosis Date  . Arrhythmia 02/06/2012   hx of paroxysmal a fib with no recurrence,rare palpations  . Breast lump   . GERD (gastroesophageal reflux disease)   . Hypertension 2013   good control  . Kidney stones    history of  . Thyroid disease     Past Surgical History:  Procedure Laterality Date  . ABDOMINAL HYSTERECTOMY    . NM MYOCAR PERF WALL MOTION  Feb 19 2008   negative for ischemia  . SUPRAVENTRICULAR TACHYCARDIA ABLATION      Current Medications: Outpatient Medications Prior to Visit  Medication Sig Dispense Refill  . alendronate (FOSAMAX) 70 MG tablet Take 70 mg by mouth daily.    [name] aspirin 81 MG tablet Take 1 tablet by mouth 2 (two)  times a week.    . calcium carbonate (TUMS) 500 MG chewable tablet 3 tablets    . Cholecalciferol (VITAMIN D-3) 5000 UNITS TABS Take 10,000 tablets by mouth every 7 (seven) days. On Fridays    . diltiazem (DILT-XR) 180 MG 24 hr capsule Take 1 capsule (180 mg total) by mouth daily. MUST KEEP APPOINTMENT 08/24/2017 WITH DR Delorise Hunkele FOR FUTURE REFILLS 90 capsule 0  . HYDROcodone-acetaminophen (NORCO/VICODIN) 5-325 MG tablet Take 1 tablet by mouth every 6 (six) hours as needed for severe pain. 10 tablet 0  . iron polysaccharides (NU-IRON) 150 MG capsule Take 1 tablet by mouth daily.    [name] levothyroxine (SYNTHROID, LEVOTHROID) 50 MCG tablet Take 50 mcg by mouth daily before breakfast.    . Multiple Vitamins-Minerals (PRESERVISION AREDS PO) Take 1 capsule by mouth 2 (two) times daily.     . Multiple Vitamins-Minerals (PRESERVISION/LUTEIN) CAPS Take 1 capsule by mouth daily.    [name] omeprazole (PRILOSEC) 20 MG capsule Take 20 mg by mouth daily with lunch.     [name] aspirin EC 81 MG tablet Take 1 tablet (81 mg total) by mouth daily. (Patient taking differently: Take 81 mg by mouth every other day. ) 90 tablet 3  . diltiazem (CARDIZEM LA) 180 MG 24 hr tablet Take 1 tablet by mouth daily.     No facility-administered medications prior to visit.      Allergies:   Latex and Sulfa antibiotics  Social History   Socioeconomic History  . Marital status: Married    Spouse name: Not on file  . Number of children: Not on file  . Years of education: Not on file  . Highest education level: Not on file  Social Needs  . Financial resource strain: Not on file  . Food insecurity - worry: Not on file  . Food insecurity - inability: Not on file  . Transportation needs - medical: Not on file  . Transportation needs - non-medical: Not on file  Occupational History  . Not on file  Tobacco Use  . Smoking status: Former Research scientist (life sciences)  . Smokeless tobacco: Never Used  Substance and Sexual Activity  . Alcohol use: No     Alcohol/week: 0.0 oz  . Drug use: No  . Sexual activity: Not on file  Other Topics Concern  . Not on file  Social History Narrative  . Not on file     Family History:  The patient's family history includes Cancer in her brother and mother; Stroke in her father.   ROS:   Please see the history of present illness.    ROS All other systems reviewed and are negative.   PHYSICAL EXAM:   VS:  BP 136/62   Pulse 90   Ht 5\' 1"  (1.549 m)   Wt 124 lb 12.8 oz (56.6 kg)   BMI 23.58 kg/m     General: Alert, oriented x3, no distress, lean, very petite Head: no evidence of trauma, PERRL, EOMI, no exophtalmos or lid lag, no myxedema, no xanthelasma; normal ears, nose and oropharynx Neck: normal jugular venous pulsations and no hepatojugular reflux; brisk carotid pulses without delay and no carotid bruits Chest: clear to auscultation, no signs of consolidation by percussion or palpation, normal fremitus, symmetrical and full respiratory excursions Cardiovascular: normal position and quality of the apical impulse, regular rhythm, normal first and second heart sounds, no murmurs, rubs or gallops Abdomen: no tenderness or distention, no masses by palpation, no abnormal pulsatility or arterial bruits, normal bowel sounds, no hepatosplenomegaly Extremities: no clubbing, cyanosis or edema; 2+ radial, ulnar and brachial pulses bilaterally; 2+ right femoral, posterior tibial and dorsalis pedis pulses; 2+ left femoral, posterior tibial and dorsalis pedis pulses; no subclavian or femoral bruits Neurological: grossly nonfocal Psych: Normal mood and affect   Wt Readings from Last 3 Encounters:  08/24/17 124 lb 12.8 oz (56.6 kg)  03/03/16 122 lb 12.8 oz (55.7 kg)  09/19/15 129 lb 11.2 oz (58.8 kg)      Studies/Labs Reviewed:   EKG:  EKG is ordered today.  The ekg ordered today demonstrates normal sinus rhythm, QTC 447 ms.  Normal tracing  Recent Labs: hemoglobin 13.5, normal liver function tests, TSH  0.85, ferritin 8.,  Total cholesterol 198, HDL 53, LDL 114, triglycerides 153.  ASSESSMENT:    1. Paroxysmal atrial fibrillation (HCC)   2. Mitral valve prolapse      PLAN:  In order of problems listed above:  1. Remote PAF event, poorly documented.  Minimal and very brief episodes of palpitations, sounding more like isolated PACs generally well controlled by diltiazem. I recommend that she continue aspirin only. No evidence of structural heart disease by previous workup. Normal nuclear stress test in 2009.  2. Reported history of mitral valve prolapse on previous echo, , asymptomatic, without physical findings to support this diagnosis and without mitral regurgitation.      Medication Adjustments/Labs and Tests Ordered: Current medicines are reviewed at length with  the patient today.  Concerns regarding medicines are outlined above.  Medication changes, Labs and Tests ordered today are listed in the Patient Instructions below. Patient Instructions  Dr Sallyanne Kuster recommends that you schedule a follow-up appointment in 12 months. You will receive a reminder letter in the mail two months in advance. If you don't receive a letter, please call our office to schedule the follow-up appointment.  If you need a refill on your cardiac medications before your next appointment, please call your pharmacy.    Signed, Sanda Klein, MD  08/24/2017 12:35 PM    [address], [place], [place]  [address] Phone: [phone]; Fax: [phone]

## 2017-08-24 NOTE — Patient Instructions (Signed)
Dr Croitoru recommends that you schedule a follow-up appointment in 12 months. You will receive a reminder letter in the mail two months in advance. If you don't receive a letter, please call our office to schedule the follow-up appointment.  If you need a refill on your cardiac medications before your next appointment, please call your pharmacy. 

## 2017-09-29 ENCOUNTER — Other Ambulatory Visit: Payer: Self-pay | Admitting: Cardiovascular Disease

## 2017-10-01 NOTE — Telephone Encounter (Signed)
Rx request sent to pharmacy.  

## 2017-10-16 DIAGNOSIS — H35342 Macular cyst, hole, or pseudohole, left eye: Secondary | ICD-10-CM | POA: Diagnosis not present

## 2017-10-16 DIAGNOSIS — H353131 Nonexudative age-related macular degeneration, bilateral, early dry stage: Secondary | ICD-10-CM | POA: Diagnosis not present

## 2017-10-16 DIAGNOSIS — H43813 Vitreous degeneration, bilateral: Secondary | ICD-10-CM | POA: Diagnosis not present

## 2017-10-23 DIAGNOSIS — E559 Vitamin D deficiency, unspecified: Secondary | ICD-10-CM | POA: Diagnosis not present

## 2017-10-23 DIAGNOSIS — I480 Paroxysmal atrial fibrillation: Secondary | ICD-10-CM | POA: Diagnosis not present

## 2017-10-23 DIAGNOSIS — I349 Nonrheumatic mitral valve disorder, unspecified: Secondary | ICD-10-CM | POA: Diagnosis not present

## 2017-10-23 DIAGNOSIS — E7800 Pure hypercholesterolemia, unspecified: Secondary | ICD-10-CM | POA: Diagnosis not present

## 2017-10-23 DIAGNOSIS — M810 Age-related osteoporosis without current pathological fracture: Secondary | ICD-10-CM | POA: Diagnosis not present

## 2017-10-23 DIAGNOSIS — E039 Hypothyroidism, unspecified: Secondary | ICD-10-CM | POA: Diagnosis not present

## 2017-10-23 DIAGNOSIS — Z1389 Encounter for screening for other disorder: Secondary | ICD-10-CM | POA: Diagnosis not present

## 2017-10-23 DIAGNOSIS — Z0000 Encounter for general adult medical examination without abnormal findings: Secondary | ICD-10-CM | POA: Diagnosis not present

## 2017-10-23 DIAGNOSIS — D509 Iron deficiency anemia, unspecified: Secondary | ICD-10-CM | POA: Diagnosis not present

## 2017-11-05 ENCOUNTER — Telehealth: Payer: Self-pay | Admitting: Cardiovascular Disease

## 2017-11-05 ENCOUNTER — Encounter: Payer: Self-pay | Admitting: Cardiovascular Disease

## 2017-11-05 ENCOUNTER — Ambulatory Visit (INDEPENDENT_AMBULATORY_CARE_PROVIDER_SITE_OTHER): Payer: Medicare HMO | Admitting: Cardiovascular Disease

## 2017-11-05 VITALS — BP 124/76 | HR 84 | Ht 61.0 in | Wt 125.0 lb

## 2017-11-05 DIAGNOSIS — I480 Paroxysmal atrial fibrillation: Secondary | ICD-10-CM

## 2017-11-05 DIAGNOSIS — I493 Ventricular premature depolarization: Secondary | ICD-10-CM

## 2017-11-05 MED ORDER — METOPROLOL SUCCINATE ER 25 MG PO TB24: 25.0000 mg | ORAL_TABLET | Freq: Every day | ORAL | 3 refills | Status: DC

## 2017-11-05 MED ORDER — METOPROLOL SUCCINATE ER 25 MG PO TB24: 25.0000 mg | ORAL_TABLET | Freq: Every day | ORAL | 0 refills | Status: DC

## 2017-11-05 NOTE — Telephone Encounter (Signed)
Spoke with patient and she has been having a lot of "skipped" beats over the weekend, worse today. She is feeling weak and some shortness of breath Patient requesting and appointment. Discussed with Dr Sallyanne Kuster and ok to see in DOD spot this afternoon. Patient aware of appointment.

## 2017-11-05 NOTE — Patient Instructions (Signed)
Dr Sallyanne Kuster has recommended making the following medication changes: 1. STOP Diltiazem 2. STOP Aspirin 3. START Metoprolol Succinate 25 mg - take 1 tablet by mouth daily  Your physician recommends that you follow-up with him as previously scheduled, in February 2020.

## 2017-11-05 NOTE — Progress Notes (Signed)
Patient had [place] with cardiology   Cardiology Office Note    Date:  11/05/2017   ID:  Jill Mcdowell, DOB 09/07/1926, MRN [id number]  PCP:  Leighton Ruff, MD  Cardiologist:   Sanda Klein, MD   Chief Complaint  Patient presents with  . Palpitations    History of Present Illness:  Jill Mcdowell is a 84 y.o. female a remote history of a single episode of atrial fibrillation (reportedly occurred in the 1990s, no firm documentation available).   Bothered intermittently by palpitations and these were particularly bad this morning.  She asked to be seen today, but by the time she arrived she feels better with fewer palpitations.  Her ECG shows PVCs with a monomorphic right bundle branch block inferior axis pattern.  The patient specifically denies any chest pain at rest or exertion, dyspnea at rest or with exertion, orthopnea, paroxysmal nocturnal dyspnea, syncope, focal neurological deficits, intermittent claudication, lower extremity edema, unexplained weight gain, cough, hemoptysis or wheezing.  He has not had any recent respiratory problems, which seemed to trigger the PVCs in the past; she admits to eating excessive amounts of chocolate recently.  Does not drink coffee or other caffeinated beverages.    Past Medical History:  Diagnosis Date  . Arrhythmia 02/06/2012   hx of paroxysmal a fib with no recurrence,rare palpations  . Breast lump   . GERD (gastroesophageal reflux disease)   . Hypertension 2013   good control  . Kidney stones    history of  . Thyroid disease     Past Surgical History:  Procedure Laterality Date  . ABDOMINAL HYSTERECTOMY    . NM MYOCAR PERF WALL MOTION  Feb 19 2008   negative for ischemia  . SUPRAVENTRICULAR TACHYCARDIA ABLATION      Current Medications: Outpatient Medications Prior to Visit  Medication Sig Dispense Refill  . alendronate (FOSAMAX) 70 MG tablet Take 70 mg by mouth daily.    . calcium carbonate (TUMS) 500 MG chewable  tablet 3 tablets    . Cholecalciferol (VITAMIN D-3) 5000 UNITS TABS Take 10,000 tablets by mouth every 7 (seven) days. On Fridays    . iron polysaccharides (NU-IRON) 150 MG capsule Take 1 tablet by mouth daily.    [name] levothyroxine (SYNTHROID, LEVOTHROID) 50 MCG tablet Take 50 mcg by mouth daily before breakfast.    . Multiple Vitamins-Minerals (PRESERVISION AREDS PO) Take 1 capsule by mouth 2 (two) times daily.     . Multiple Vitamins-Minerals (PRESERVISION/LUTEIN) CAPS Take 1 capsule by mouth daily.    [name] omeprazole (PRILOSEC) 20 MG capsule Take 20 mg by mouth daily with lunch.     [name] aspirin 81 MG tablet Take 1 tablet by mouth 2 (two) times a week.    . diltiazem (DILT-XR) 180 MG 24 hr capsule Take 1 capsule (180 mg total) by mouth daily. 90 capsule 1  . HYDROcodone-acetaminophen (NORCO/VICODIN) 5-325 MG tablet Take 1 tablet by mouth every 6 (six) hours as needed for severe pain. 10 tablet 0   No facility-administered medications prior to visit.      Allergies:   Latex and Sulfa antibiotics   Social History   Socioeconomic History  . Marital status: Married    Spouse name: Not on file  . Number of children: Not on file  . Years of education: Not on file  . Highest education level: Not on file  Occupational History  . Not on file  Social Needs  . Financial resource strain: Not on  file  . Food insecurity:    Worry: Not on file    Inability: Not on file  . Transportation needs:    Medical: Not on file    Non-medical: Not on file  Tobacco Use  . Smoking status: Former Research scientist (life sciences)  . Smokeless tobacco: Never Used  Substance and Sexual Activity  . Alcohol use: No    Alcohol/week: 0.0 oz  . Drug use: No  . Sexual activity: Not on file  Lifestyle  . Physical activity:    Days per week: Not on file    Minutes per session: Not on file  . Stress: Not on file  Relationships  . Social connections:    Talks on phone: Not on file    Gets together: Not on file    Attends religious  service: Not on file    Active member of club or organization: Not on file    Attends meetings of clubs or organizations: Not on file    Relationship status: Not on file  Other Topics Concern  . Not on file  Social History Narrative  . Not on file     Family History:  The patient's family history includes Cancer in her brother and mother; Stroke in her father.   ROS:   Please see the history of present illness.    ROS All other systems reviewed and are negative.   PHYSICAL EXAM:   VS:  BP 124/76   Pulse 84   Ht 5\' 1"  (1.549 m)   Wt 125 lb (56.7 kg)   BMI 23.62 kg/m      General: Alert, oriented x3, no distress, lean and small build Head: no evidence of trauma, PERRL, EOMI, no exophtalmos or lid lag, no myxedema, no xanthelasma; normal ears, nose and oropharynx Neck: normal jugular venous pulsations and no hepatojugular reflux; brisk carotid pulses without delay and no carotid bruits Chest: clear to auscultation, no signs of consolidation by percussion or palpation, normal fremitus, symmetrical and full respiratory excursions Cardiovascular: normal position and quality of the apical impulse, regular rhythm, normal first and second heart sounds, no murmurs, rubs or gallops Abdomen: no tenderness or distention, no masses by palpation, no abnormal pulsatility or arterial bruits, normal bowel sounds, no hepatosplenomegaly Extremities: no clubbing, cyanosis or edema; 2+ radial, ulnar and brachial pulses bilaterally; 2+ right femoral, posterior tibial and dorsalis pedis pulses; 2+ left femoral, posterior tibial and dorsalis pedis pulses; no subclavian or femoral bruits Neurological: grossly nonfocal Psych: Normal mood and affect    Wt Readings from Last 3 Encounters:  11/05/17 125 lb (56.7 kg)  08/24/17 124 lb 12.8 oz (56.6 kg)  03/03/16 122 lb 12.8 oz (55.7 kg)      Studies/Labs Reviewed:   EKG:  EKG is ordered today.  The ekg ordered today demonstrates normal sinus rhythm,  monomorphic PVCs with RBBB inferior axis pattern otherwise normal tracing  Recent Labs: hemoglobin 13.5, normal liver function tests, TSH 0.85, ferritin 8.,  Total cholesterol 198, HDL 53, LDL 114, triglycerides 153.  ASSESSMENT:    1. Paroxysmal atrial fibrillation (HCC)   2. PVC's (premature ventricular contractions)      PLAN:  In order of problems listed above:  1. PVCs: Structural heart problems these are unlikely to be of significant clinical impact but they are symptomatic.  We will switch from diltiazem to a beta-blocker which should be more effective.  1 of her old echoes reported mitral valve prolapse, and even though there was no mitral valve  regurgitation.   2. PAFib: This is reported in her old chart but I found no supporting documentation.  She has not had recurrent atrial fibrillation in over 20 years.      Medication Adjustments/Labs and Tests Ordered: Current medicines are reviewed at length with the patient today.  Concerns regarding medicines are outlined above.  Medication changes, Labs and Tests ordered today are listed in the Patient Instructions below. Patient Instructions  Dr Sallyanne Kuster has recommended making the following medication changes: 1. STOP Diltiazem 2. STOP Aspirin 3. START Metoprolol Succinate 25 mg - take 1 tablet by mouth daily  Your physician recommends that you follow-up with him as previously scheduled, in February 2020.      Signed, Sanda Klein, MD  11/05/2017 6:00 PM    [address], [place], [place]  [address] Phone: [phone]; Fax: [phone]

## 2017-11-27 ENCOUNTER — Other Ambulatory Visit: Payer: Self-pay | Admitting: Cardiovascular Disease

## 2017-11-29 ENCOUNTER — Telehealth: Payer: Self-pay | Admitting: Cardiovascular Disease

## 2017-11-29 NOTE — Telephone Encounter (Signed)
Please ask her to reduce the metoprolol to half a tablet daily. We need to wean off it if we decide to change it. Let's reassess how she feels on half dose. The thyroid problems are a very uncommon event and could be readily addressed if they occur. MCr

## 2017-11-29 NOTE — Telephone Encounter (Signed)
Advised patient, verbalized understanding. She will let us know how she is doing on the change

## 2017-11-29 NOTE — Telephone Encounter (Signed)
New Message    Pt c/o medication issue:  1. Name of Medication: metoprolol succinate (TOPROL-XL) 25 MG 24 hr tablet  2. How are you currently taking this medication (dosage and times per day)?   3. Are you having a reaction (difficulty breathing--STAT)? Headaches, tired and hallucination  4. What is your medication issue? Patient is calling in because she is having side effects from the medication. Please call.

## 2017-11-29 NOTE — Telephone Encounter (Signed)
Spoke with patient and since she started the Toprol XL 25 mg daily she has been having hallucinations at night and increased headaches. Plus she read on the side effects could cause overactive thyroid and she takes Synthroid. She is concerned about continuing this. Will forward to Dr Sallyanne Kuster for review

## 2017-12-04 ENCOUNTER — Telehealth: Payer: Self-pay | Admitting: Cardiovascular Disease

## 2017-12-04 NOTE — Telephone Encounter (Signed)
Please stop the metoprolol.  Let's give it some time to wash out of her system and see how she feels before we try something else. MCr

## 2017-12-04 NOTE — Telephone Encounter (Signed)
Spoke to patient ---  Patient is calling back with update.States she has been taking metoprolol xl 12.5 mg at 10 pm as recommend by Dr C. last week .   No hallucination noted , but has been  having episodes of feeling Weak ,shaky  almost passing out .  Patient states an episode happen this morning after eating breakfast. Patient state she has not checked blood pressure or heart rate this week.   She states it can occur now and then no set time. Patient aware will route to Dr Sallyanne Kuster.

## 2017-12-04 NOTE — Telephone Encounter (Signed)
Spoke to patient . She is aware will stop taking  Metoprolol , contact office next  Tuesday or Wednesday to inform Dr C. of how she is feeling since being off metoprolol.

## 2017-12-04 NOTE — Telephone Encounter (Signed)
Pt c/o medication issue: 1. Name of Medication: Metoprolol 2. How are you currently taking this medication (dosage and times per day)? 12.5 started taking this a week ago 3. Are you having a reaction (difficulty breathing--STAT)?  No, feels like she is going to pass out,weak and dizzyy, still some palpitation 4. What is your medication issue?

## 2017-12-11 NOTE — Telephone Encounter (Signed)
Can we please give her some Bystolic samples and see if she tolerates 2.5 mg daily, please (probably need to do half of a 5 mg sample). If that does not sit well with her either, will switch back to her previous diltiazem prescription. Thank you. MCr

## 2017-12-11 NOTE — Telephone Encounter (Signed)
Follow up   Patient calling to report she has had episodes of fast HR since she has stopped Metoprolol. Patient wants to discuss medication to take when HR is increased. When HR is over 110 patient states she has mild chest discomfort.  STAT if HR is under 50 or over 120 (normal HR is 60-100 beats per minute)  1) What is your heart rate? 107   2) Do you have a log of your heart rate readings (document readings)? 104, 101  3) Do you have any other symptoms? NO

## 2017-12-11 NOTE — Telephone Encounter (Signed)
Pt aware of new recommendations and Bystolic  5 mg samples left at front desk with pt knowing to take 1/2 tab for 2.5 mg .Adonis Housekeeper

## 2017-12-11 NOTE — Telephone Encounter (Signed)
Per pt was told to call office  since stopping the Metoprolol, heart rate has increased to 100 or over. With some days feeling weak ,faint ,and chest discomfort .Will forward to Dr Sallyanne Kuster for review and recommendations ./cy

## 2018-01-01 ENCOUNTER — Telehealth: Payer: Self-pay | Admitting: Cardiology

## 2018-01-01 MED ORDER — NEBIVOLOL HCL 2.5 MG PO TABS: 2.5000 mg | ORAL_TABLET | Freq: Every day | ORAL | 3 refills | Status: DC

## 2018-01-01 NOTE — Addendum Note (Signed)
Addended by: Meryl Crutch on: 01/01/2018 05:10 PM   Modules accepted: Orders

## 2018-01-01 NOTE — Telephone Encounter (Signed)
Please Rx Bystolic 2.5 mg daily. Thanks [employer]

## 2018-01-01 NOTE — Telephone Encounter (Signed)
I am afraid we will never find a medication that will completely resolve the palpitations without also dropping the BP and causing weakness. The question is, does she feel overall better with the Bystolic, compared to being off all meds? MCr

## 2018-01-01 NOTE — Telephone Encounter (Signed)
See previous encounter

## 2018-01-01 NOTE — Telephone Encounter (Signed)
Spoke with pt who states she was provided samples of Bystolic to see if she would be able to tolerate it and call back with symptoms. She reports since taking the medication her HR has been within 60-80 and she has experienced 2-3 episodes of feeling weak/faint. She also reports feeling SOB at times but doesn't know if it could be related to the whether. On Sunday she reports having an episode of afib that lasted approximately 8-10 sec. Routing to Dr. Loletha Grayer for recommendation.

## 2018-01-01 NOTE — Telephone Encounter (Signed)
Spoke with pt and advised that prescription has been sent to [facility] and sample left a front desk for pick up.  Bystolic 5 mg Qty: 1 bottle Lot # [id number] expires: 7/21.

## 2018-01-01 NOTE — Telephone Encounter (Signed)
Spoke with pt who states that overall she does feel better taking the bystolic in comparison to the other medications. She reports that if Dr. Loletha Grayer would like for her to continue on this medication she would need an order sent to the pharmacy or samples. Routing to Dr. Loletha Grayer for recommendation.

## 2018-01-01 NOTE — Telephone Encounter (Signed)
New Message:      Pt is calling and wanting to discuss a new medication but pt was not sure the name of the medication.

## 2018-01-07 ENCOUNTER — Telehealth: Payer: Self-pay | Admitting: Cardiovascular Disease

## 2018-01-07 DIAGNOSIS — R42 Dizziness and giddiness: Secondary | ICD-10-CM | POA: Diagnosis not present

## 2018-01-07 MED ORDER — DILTIAZEM HCL ER 180 MG PO CP24: 180.0000 mg | ORAL_CAPSULE | Freq: Every day | ORAL | Status: DC

## 2018-01-07 NOTE — Telephone Encounter (Signed)
Patient made aware that Bystolic can be discontinued and Diltiazem can be restarted. The change has been made in epic.

## 2018-01-07 NOTE — Telephone Encounter (Signed)
Can switch back to diltiazem if she prefers. MCr

## 2018-01-07 NOTE — Telephone Encounter (Signed)
Pt calling and need to speak to nurse concerning questions with her medications. Please call

## 2018-01-07 NOTE — Telephone Encounter (Signed)
Returned the call to the patient. She stated that she has been on Bystolic 2.5 mg for around a month now. Her heart rate has been running around 60-80. She stated that she has still been having feelings of weakness and "draggy" She would like to know if she can switch back to the diltiazem. She has been made aware that if the bystolic is working for her then she should try and stay on it but has declined and stated that she felt better on the diltiazem.

## 2018-01-18 DIAGNOSIS — R399 Unspecified symptoms and signs involving the genitourinary system: Secondary | ICD-10-CM | POA: Diagnosis not present

## 2018-01-18 DIAGNOSIS — R55 Syncope and collapse: Secondary | ICD-10-CM | POA: Diagnosis not present

## 2018-01-18 DIAGNOSIS — R8290 Unspecified abnormal findings in urine: Secondary | ICD-10-CM | POA: Diagnosis not present

## 2018-01-18 DIAGNOSIS — R0789 Other chest pain: Secondary | ICD-10-CM | POA: Diagnosis not present

## 2018-01-24 DIAGNOSIS — M810 Age-related osteoporosis without current pathological fracture: Secondary | ICD-10-CM | POA: Diagnosis not present

## 2018-01-24 DIAGNOSIS — M8589 Other specified disorders of bone density and structure, multiple sites: Secondary | ICD-10-CM | POA: Diagnosis not present

## 2018-01-24 DIAGNOSIS — Z1231 Encounter for screening mammogram for malignant neoplasm of breast: Secondary | ICD-10-CM | POA: Diagnosis not present

## 2018-02-18 ENCOUNTER — Emergency Department (HOSPITAL_COMMUNITY): Payer: Medicare HMO

## 2018-02-18 ENCOUNTER — Encounter (HOSPITAL_COMMUNITY): Payer: Self-pay | Admitting: *Deleted

## 2018-02-18 ENCOUNTER — Emergency Department (HOSPITAL_COMMUNITY)
Admission: EM | Admit: 2018-02-18 | Discharge: 2018-02-18 | Disposition: A | Discharge status: Home / Self Care | Payer: Medicare HMO | Attending: Emergency Medicine | Admitting: Emergency Medicine

## 2018-02-18 DIAGNOSIS — I10 Essential (primary) hypertension: Secondary | ICD-10-CM | POA: Insufficient documentation

## 2018-02-18 DIAGNOSIS — X500XXA Overexertion from strenuous movement or load, initial encounter: Secondary | ICD-10-CM | POA: Diagnosis not present

## 2018-02-18 DIAGNOSIS — M545 Low back pain, unspecified: Secondary | ICD-10-CM

## 2018-02-18 DIAGNOSIS — Z79899 Other long term (current) drug therapy: Secondary | ICD-10-CM | POA: Insufficient documentation

## 2018-02-18 DIAGNOSIS — Z87891 Personal history of nicotine dependence: Secondary | ICD-10-CM | POA: Diagnosis not present

## 2018-02-18 MED ORDER — DICLOFENAC EPOLAMINE 1.3 % TD PTCH
1.0000 | MEDICATED_PATCH | Freq: Once | TRANSDERMAL | Status: DC
  Administered 2018-02-18: 1 via TRANSDERMAL
  Filled 2018-02-18: qty 1

## 2018-02-18 NOTE — ED Triage Notes (Signed)
Pt complains of lower back pain since lifting her husband ~1.5 hours prior to arrival to ED. Pt states she felt a pop when the injury occurred.

## 2018-02-18 NOTE — Discharge Instructions (Addendum)
As discussed, your evaluation today has been largely reassuring.  But, it is important that you monitor your condition carefully, and do not hesitate to return to the ED if you develop new, or concerning changes in your condition.  Please obtain patches that contain Dimethyl Salicylate (Salon Pas is one brand) from your pharmacy and use these for the next five days (twice daily.)  Otherwise, please follow-up with your physician for appropriate ongoing care.

## 2018-02-18 NOTE — ED Notes (Signed)
Pt states she was helping her husband (who has Parkinsons) in the garden and standing him up. Pt states that when she went to go stand him, she felt a sharp back pain and heard a "pop".

## 2018-02-18 NOTE — ED Provider Notes (Signed)
[facility] DEPT Provider Note   CSN: [id number] Arrival date & time: 02/18/18  1314     History   Chief Complaint Chief Complaint  Patient presents with  . Back Pain    HPI Jill Mcdowell is a 84 y.o. female.  HPI  Patient presents with back pain. Pain began suddenly about 2 hours ago. Patient was leaning over to pick up her husband who is elderly, has Parkinson's, when she felt sudden onset of pain, and an audible pop in her lower back. Since that time she has had sore moderate pain in the lower lumbar spine, midline with mild bilateral radiation. No new weakness in either lower extremity, no incontinence, no other complaints per She has taken no medication for pain relief. She does have a history of compression fracture sustained during a similar episode 2 years ago.  Past Medical History:  Diagnosis Date  . Arrhythmia 02/06/2012   hx of paroxysmal a fib with no recurrence,rare palpations  . Breast lump   . GERD (gastroesophageal reflux disease)   . Hypertension 2013   good control  . Kidney stones    history of  . Thyroid disease     Patient Active Problem List   Diagnosis Date Noted  . PVC's (premature ventricular contractions) 11/05/2017  . Palpitations 11/17/2013  . Syncope, near 11/17/2013  . Paroxysmal atrial fibrillation ([facility]) 02/26/2013  . Breast cyst 10/25/2012  . Breast mass, right 10/21/2012    Past Surgical History:  Procedure Laterality Date  . ABDOMINAL HYSTERECTOMY    . NM MYOCAR PERF WALL MOTION  Feb 19 2008   negative for ischemia  . SUPRAVENTRICULAR TACHYCARDIA ABLATION       OB History   None      Home Medications    Prior to Admission medications   Medication Sig Start Date End Date Taking? Authorizing Provider  acetaminophen (TYLENOL) 500 MG tablet Take 500 mg by mouth daily as needed for headache.   Yes [provider]  calcium carbonate (TUMS) 500 MG chewable tablet Chew 1 tablet by  mouth daily.    Yes [provider]  Carboxymethylcellulose Sodium (EYE DROPS OP) Place 1 drop into both eyes daily as needed (dry eyes).   Yes [provider]  Cholecalciferol (VITAMIN D-3) 5000 UNITS TABS Take 10,000 tablets by mouth every 7 (seven) days. On Fridays   Yes [provider]  diltiazem (DILT-XR) 180 MG 24 hr capsule Take 1 capsule (180 mg total) by mouth daily. 01/07/18  Yes Croitoru, Mihai, MD  iron polysaccharides (NU-IRON) 150 MG capsule Take 1 tablet by mouth 2 (two) times a week.    Yes [provider]  levothyroxine (SYNTHROID, LEVOTHROID) 50 MCG tablet Take 50 mcg by mouth daily before breakfast.   Yes [provider]  Multiple Vitamins-Minerals (PRESERVISION AREDS PO) Take 1 capsule by mouth 2 (two) times daily.    Yes [provider]  omeprazole (PRILOSEC) 20 MG capsule Take 20 mg by mouth daily with lunch.    Yes [provider]    Family History Family History  Problem Relation Age of Onset  . Cancer Mother   . Stroke Father   . Cancer Brother     Social History Social History   Tobacco Use  . Smoking status: Former Research scientist (life sciences)  . Smokeless tobacco: Never Used  Substance Use Topics  . Alcohol use: No    Alcohol/week: 0.0 standard drinks  . Drug use: No  Allergies   Metoprolol; Latex; and Sulfa antibiotics   Review of Systems Review of Systems  Constitutional:       Per HPI, otherwise negative  HENT:       Per HPI, otherwise negative  Respiratory:       Per HPI, otherwise negative  Cardiovascular:       Per HPI, otherwise negative  Gastrointestinal: Negative for vomiting.  Endocrine:       Negative aside from HPI  Genitourinary:       Neg aside from HPI   Musculoskeletal:       Per HPI, otherwise negative  Skin: Negative.   Neurological: Negative for syncope and weakness.     Physical Exam Updated Vital Signs BP (!) 154/81 (BP Location: Left Arm)   Pulse 98   Temp 97.6 F  (36.4 C) (Oral)   Resp 18   SpO2 93%   Physical Exam  Constitutional: She is oriented to person, place, and time. She appears well-developed and well-nourished. No distress.  HENT:  Head: Normocephalic and atraumatic.  Eyes: Conjunctivae and EOM are normal.  Cardiovascular: Normal rate and regular rhythm.  Pulmonary/Chest: Effort normal and breath sounds normal. No stridor. No respiratory distress.  Abdominal: She exhibits no distension.  Musculoskeletal: She exhibits no edema.       Arms: Neurological: She is alert and oriented to person, place, and time. She displays atrophy. She displays no tremor. No cranial nerve deficit. She exhibits normal muscle tone. She displays no seizure activity. Coordination normal.  Patient has preserved hip flexion bilaterally, distal neurovascular status, with appropriate strength bilateral, symmetric 5/5.  Skin: Skin is warm and dry.  Psychiatric: She has a normal mood and affect.  Nursing note and vitals reviewed.    ED Treatments / Results   Radiology Dg Lumbar Spine Complete  Result Date: 02/18/2018 CLINICAL DATA:  Onset of low back pain when the patient lifted her husband today. Initial encounter. EXAM: LUMBAR SPINE - COMPLETE 4+ VIEW COMPARISON:  Plain films lumbar spine 09/18/2015. FINDINGS: Mild convex right scoliosis appears slightly worse than on the prior examination. There is exaggeration of the normal lumbar lordosis but no listhesis. No acute fracture. Remote mild superior endplate compression fractures of T10 and T12 are unchanged. Multilevel degenerative disc disease and facet arthropathy are noted. Atherosclerosis is identified. IMPRESSION: No acute abnormality. Remote T10 and T12 compression fractures. Multilevel degenerative disease. Atherosclerosis. Electronically Signed   By: Inge Rise M.D.   On: 02/18/2018 14:14   Procedures Procedures (including critical care time)  Medications Ordered in ED Medications  diclofenac  (FLECTOR) 1.3 % 1 patch (has no administration in time range)     Initial Impression / Assessment and Plan / ED Course  I have reviewed the triage vital signs and the nursing notes.  Pertinent labs & imaging results that were available during my care of the patient were reviewed by me and considered in my medical decision making (see chart for details).  I reviewed the x-ray images myself, in the room with patient, discussed findings with patient, and to family members.  When specifically discussed absence of evidence for any fracture, suspicion for disc pathology versus soft tissue injury. Patient remains hemodynamically unremarkable, neurovascularly intact distally, with no evidence for neurologic complication. We discussed options for medication for pain control, patient will start a topical NSAID therapy regimen, follow-up with primary care for additional testing, medications as needed.  Final Clinical Impressions(s) / ED Diagnoses  Back pain, low, acute  Carmin Muskrat, MD 02/18/18 1510

## 2018-02-22 DIAGNOSIS — M545 Low back pain: Secondary | ICD-10-CM | POA: Diagnosis not present

## 2018-02-22 DIAGNOSIS — R350 Frequency of micturition: Secondary | ICD-10-CM | POA: Diagnosis not present

## 2018-02-28 DIAGNOSIS — H5203 Hypermetropia, bilateral: Secondary | ICD-10-CM | POA: Diagnosis not present

## 2018-02-28 DIAGNOSIS — Z961 Presence of intraocular lens: Secondary | ICD-10-CM | POA: Diagnosis not present

## 2018-02-28 DIAGNOSIS — H26491 Other secondary cataract, right eye: Secondary | ICD-10-CM | POA: Diagnosis not present

## 2018-02-28 DIAGNOSIS — H353131 Nonexudative age-related macular degeneration, bilateral, early dry stage: Secondary | ICD-10-CM | POA: Diagnosis not present

## 2018-03-06 DIAGNOSIS — H43813 Vitreous degeneration, bilateral: Secondary | ICD-10-CM | POA: Diagnosis not present

## 2018-03-06 DIAGNOSIS — H33322 Round hole, left eye: Secondary | ICD-10-CM | POA: Diagnosis not present

## 2018-03-06 DIAGNOSIS — H353131 Nonexudative age-related macular degeneration, bilateral, early dry stage: Secondary | ICD-10-CM | POA: Diagnosis not present

## 2018-03-26 DIAGNOSIS — H26491 Other secondary cataract, right eye: Secondary | ICD-10-CM | POA: Diagnosis not present

## 2018-03-27 DIAGNOSIS — Z23 Encounter for immunization: Secondary | ICD-10-CM | POA: Diagnosis not present

## 2018-04-02 DIAGNOSIS — D229 Melanocytic nevi, unspecified: Secondary | ICD-10-CM | POA: Diagnosis not present

## 2018-04-02 DIAGNOSIS — L821 Other seborrheic keratosis: Secondary | ICD-10-CM | POA: Diagnosis not present

## 2018-05-11 ENCOUNTER — Other Ambulatory Visit: Payer: Self-pay | Admitting: Cardiovascular Disease

## 2018-06-21 DIAGNOSIS — H00014 Hordeolum externum left upper eyelid: Secondary | ICD-10-CM | POA: Diagnosis not present

## 2018-06-24 DIAGNOSIS — H0019 Chalazion unspecified eye, unspecified eyelid: Secondary | ICD-10-CM | POA: Diagnosis not present

## 2018-06-24 DIAGNOSIS — L821 Other seborrheic keratosis: Secondary | ICD-10-CM | POA: Diagnosis not present

## 2018-07-02 DIAGNOSIS — H35342 Macular cyst, hole, or pseudohole, left eye: Secondary | ICD-10-CM | POA: Diagnosis not present

## 2018-07-02 DIAGNOSIS — H353122 Nonexudative age-related macular degeneration, left eye, intermediate dry stage: Secondary | ICD-10-CM | POA: Diagnosis not present

## 2018-07-02 DIAGNOSIS — H35372 Puckering of macula, left eye: Secondary | ICD-10-CM | POA: Diagnosis not present

## 2018-07-02 DIAGNOSIS — H35453 Secondary pigmentary degeneration, bilateral: Secondary | ICD-10-CM | POA: Diagnosis not present

## 2018-07-02 DIAGNOSIS — Z961 Presence of intraocular lens: Secondary | ICD-10-CM | POA: Diagnosis not present

## 2018-07-02 DIAGNOSIS — H3122 Choroidal dystrophy (central areolar) (generalized) (peripapillary): Secondary | ICD-10-CM | POA: Diagnosis not present

## 2018-07-02 DIAGNOSIS — H353111 Nonexudative age-related macular degeneration, right eye, early dry stage: Secondary | ICD-10-CM | POA: Diagnosis not present

## 2018-07-02 DIAGNOSIS — H47393 Other disorders of optic disc, bilateral: Secondary | ICD-10-CM | POA: Diagnosis not present

## 2018-07-26 DIAGNOSIS — H0014 Chalazion left upper eyelid: Secondary | ICD-10-CM | POA: Diagnosis not present

## 2018-08-30 ENCOUNTER — Ambulatory Visit: Payer: Medicare HMO | Admitting: Cardiovascular Disease

## 2018-08-30 ENCOUNTER — Encounter: Payer: Self-pay | Admitting: Cardiovascular Disease

## 2018-08-30 VITALS — BP 112/60 | HR 78 | Ht 61.5 in | Wt 117.4 lb

## 2018-08-30 DIAGNOSIS — I493 Ventricular premature depolarization: Secondary | ICD-10-CM

## 2018-08-30 DIAGNOSIS — I480 Paroxysmal atrial fibrillation: Secondary | ICD-10-CM | POA: Diagnosis not present

## 2018-08-30 NOTE — Patient Instructions (Signed)
Medication Instructions:  Continue same medications If you need a refill on your cardiac medications before your next appointment, please call your pharmacy.   Lab work: None ordered   Testing/Procedures: None ordered  Follow-Up: At [facility], you and your health needs are our priority.  As part of our continuing mission to provide you with exceptional heart care, we have created designated Provider Care Teams.  These Care Teams include your primary Cardiologist (physician) and Advanced Practice Providers (APPs -  Physician Assistants and Nurse Practitioners) who all work together to provide you with the care you need, when you need it. . Call in Oct to schedule appointment Jan appointment with Dr.Croitoru Schedule your husband appointment with Dr.Croitoru too

## 2018-08-30 NOTE — Progress Notes (Signed)
ekg 

## 2018-08-30 NOTE — Progress Notes (Signed)
Patient had [facility] with cardiology   Cardiology Office Note    Date:  08/30/2018   ID:  Jill Mcdowell, DOB [birth year], MRN [id number]  PCP:  Leighton Ruff, MD  Cardiologist:   Sanda Klein, MD   No chief complaint on file.   History of Present Illness:  Jill Mcdowell is a 85 y.o. female a remote history of a single episode of atrial fibrillation (reportedly occurred in the 1990s, no firm documentation available).   She is infrequently bothered by palpitations that she noticed that her heart rate is sometimes in the low 100s when this happens and associates vague chest discomfort.  This has only happened once or twice in the last 6 months.    Previously when she was complaining of palpitations she seems to have monomorphic PVCs with a right bundle branch block pattern. We tried switching from diltiazem to beta-blockers but these were not poorly tolerated because of visual symptoms.  It sounds like she developed hallucinations consistent with Sherran Needs syndrome (she will see a stereotypical geometrical pattern or sometimes small female figures).  The patient specifically denies any chest pain at rest exertion, dyspnea at rest or with exertion, orthopnea, paroxysmal nocturnal dyspnea, syncope, palpitations, focal neurological deficits, intermittent claudication, lower extremity edema, unexplained weight gain, cough, hemoptysis or wheezing.    Past Medical History:  Diagnosis Date  . Arrhythmia 02/06/2012   hx of paroxysmal a fib with no recurrence,rare palpations  . Breast lump   . GERD (gastroesophageal reflux disease)   . Hypertension 2013   good control  . Kidney stones    history of  . Thyroid disease     Past Surgical History:  Procedure Laterality Date  . ABDOMINAL HYSTERECTOMY    . NM MYOCAR PERF WALL MOTION  Feb 19 2008   negative for ischemia  . SUPRAVENTRICULAR TACHYCARDIA ABLATION      Current Medications: Outpatient Medications Prior to Visit    Medication Sig Dispense Refill  . acetaminophen (TYLENOL) 500 MG tablet Take 500 mg by mouth daily as needed for headache.    . calcium carbonate (TUMS) 500 MG chewable tablet Chew 1 tablet by mouth daily.     . Carboxymethylcellulose Sodium (EYE DROPS OP) Place 1 drop into both eyes daily as needed (dry eyes).    . Cholecalciferol (VITAMIN D-3) 5000 UNITS TABS Take 10,000 tablets by mouth every 7 (seven) days. On Fridays    . diltiazem (DILT-XR) 180 MG 24 hr capsule Take 1 capsule (180 mg total) by mouth daily. 90 capsule 1  . iron polysaccharides (NU-IRON) 150 MG capsule Take 1 tablet by mouth 2 (two) times a week.     . levothyroxine (SYNTHROID, LEVOTHROID) 50 MCG tablet Take 50 mcg by mouth daily before breakfast.    . Multiple Vitamins-Minerals (PRESERVISION AREDS PO) Take 1 capsule by mouth 2 (two) times daily.     [name] omeprazole (PRILOSEC) 20 MG capsule Take 20 mg by mouth daily with lunch.      No facility-administered medications prior to visit.      Allergies:   Metoprolol; Latex; and Sulfa antibiotics   Social History   Socioeconomic History  . Marital status: Married    Spouse name: Not on file  . Number of children: Not on file  . Years of education: Not on file  . Highest education level: Not on file  Occupational History  . Not on file  Social Needs  . Financial resource strain: Not on file  .  Food insecurity:    Worry: Not on file    Inability: Not on file  . Transportation needs:    Medical: Not on file    Non-medical: Not on file  Tobacco Use  . Smoking status: Former Research scientist (life sciences)  . Smokeless tobacco: Never Used  Substance and Sexual Activity  . Alcohol use: No    Alcohol/week: 0.0 standard drinks  . Drug use: No  . Sexual activity: Not on file  Lifestyle  . Physical activity:    Days per week: Not on file    Minutes per session: Not on file  . Stress: Not on file  Relationships  . Social connections:    Talks on phone: Not on file    Gets together: Not  on file    Attends religious service: Not on file    Active member of club or organization: Not on file    Attends meetings of clubs or organizations: Not on file    Relationship status: Not on file  Other Topics Concern  . Not on file  Social History Narrative  . Not on file     Family History:  The patient's family history includes Cancer in her brother and mother; Stroke in her father.   ROS:   Please see the history of present illness.    ROS All other systems reviewed and are negative.   PHYSICAL EXAM:   VS:  BP 112/60   Pulse 78   Ht 5' 1.5" (1.562 m)   Wt 117 lb 6.4 oz (53.3 kg)   SpO2 97%   BMI 21.82 kg/m      General: Alert, oriented x3, no distress, lean Head: no evidence of trauma, PERRL, EOMI, no exophtalmos or lid lag, no myxedema, no xanthelasma; normal ears, nose and oropharynx Neck: normal jugular venous pulsations and no hepatojugular reflux; brisk carotid pulses without delay and no carotid bruits Chest: clear to auscultation, no signs of consolidation by percussion or palpation, normal fremitus, symmetrical and full respiratory excursions Cardiovascular: normal position and quality of the apical impulse, regular rhythm, normal first and second heart sounds, no murmurs, rubs or gallops Abdomen: no tenderness or distention, no masses by palpation, no abnormal pulsatility or arterial bruits, normal bowel sounds, no hepatosplenomegaly Extremities: no clubbing, cyanosis or edema; 2+ radial, ulnar and brachial pulses bilaterally; 2+ right femoral, posterior tibial and dorsalis pedis pulses; 2+ left femoral, posterior tibial and dorsalis pedis pulses; no subclavian or femoral bruits Neurological: grossly nonfocal Psych: Normal mood and affect     Wt Readings from Last 3 Encounters:  08/30/18 117 lb 6.4 oz (53.3 kg)  11/05/17 125 lb (56.7 kg)  08/24/17 124 lb 12.8 oz (56.6 kg)      Studies/Labs Reviewed:   EKG:  EKG is ordered today.  The ekg ordered today  demonstrates normal sinus rhythm, normal tracing  Recent Labs: October 23, 2017 hemoglobin 14, normal liver function tests, TSH 0.76 Total cholesterol 206, HDL 57, LDL 124, triglycerides 123.  ASSESSMENT:    1. Paroxysmal atrial fibrillation (HCC)   2. PVCs (premature ventricular contractions)      PLAN:  In order of problems listed above:  1. PAFib: This is reported in her old chart but I found no supporting documentation.  She has not had clinically evident recurrent atrial fibrillation in over 20 years.  I do not think anticoagulation is justified 2. PVCs: Most likely responsible for her occasional palpitations.  Not particularly symptomatic.  Changing to beta-blockers from diltiazem  led to development of visual hallucinations suggestive of Sherran Needs syndrome.  Does not have any significant structural heart disease. 3. Macular degeneration: Her retina specialist is Dr. Posey Pronto.      Medication Adjustments/Labs and Tests Ordered: Current medicines are reviewed at length with the patient today.  Concerns regarding medicines are outlined above.  Medication changes, Labs and Tests ordered today are listed in the Patient Instructions below. Patient Instructions  Medication Instructions:  Continue same medications If you need a refill on your cardiac medications before your next appointment, please call your pharmacy.   Lab work: None ordered   Testing/Procedures: None ordered  Follow-Up: At [facility], you and your health needs are our priority.  As part of our continuing mission to provide you with exceptional heart care, we have created designated Provider Care Teams.  These Care Teams include your primary Cardiologist (physician) and Advanced Practice Providers (APPs -  Physician Assistants and Nurse Practitioners) who all work together to provide you with the care you need, when you need it. . Call in Oct to schedule appointment Jan appointment with Dr.Brenisha Tsui  Schedule your husband appointment with Dr.Lanika Colgate too      Signed, Sanda Klein, MD  08/30/2018 8:39 AM    [facility] [address], [place], [place]  [address] Phone: [phone]; Fax: [phone]

## 2018-10-25 ENCOUNTER — Other Ambulatory Visit: Payer: Self-pay | Admitting: Cardiovascular Disease

## 2018-10-26 NOTE — Telephone Encounter (Signed)
Dilt-XR 180 MG refilled.

## 2018-12-03 DIAGNOSIS — Z0000 Encounter for general adult medical examination without abnormal findings: Secondary | ICD-10-CM | POA: Diagnosis not present

## 2018-12-03 DIAGNOSIS — E7800 Pure hypercholesterolemia, unspecified: Secondary | ICD-10-CM | POA: Diagnosis not present

## 2018-12-03 DIAGNOSIS — E559 Vitamin D deficiency, unspecified: Secondary | ICD-10-CM | POA: Diagnosis not present

## 2018-12-03 DIAGNOSIS — D509 Iron deficiency anemia, unspecified: Secondary | ICD-10-CM | POA: Diagnosis not present

## 2018-12-03 DIAGNOSIS — E039 Hypothyroidism, unspecified: Secondary | ICD-10-CM | POA: Diagnosis not present

## 2018-12-09 DIAGNOSIS — M549 Dorsalgia, unspecified: Secondary | ICD-10-CM | POA: Diagnosis not present

## 2018-12-09 DIAGNOSIS — E039 Hypothyroidism, unspecified: Secondary | ICD-10-CM | POA: Diagnosis not present

## 2018-12-09 DIAGNOSIS — D509 Iron deficiency anemia, unspecified: Secondary | ICD-10-CM | POA: Diagnosis not present

## 2018-12-09 DIAGNOSIS — I480 Paroxysmal atrial fibrillation: Secondary | ICD-10-CM | POA: Diagnosis not present

## 2018-12-09 DIAGNOSIS — I349 Nonrheumatic mitral valve disorder, unspecified: Secondary | ICD-10-CM | POA: Diagnosis not present

## 2018-12-09 DIAGNOSIS — E559 Vitamin D deficiency, unspecified: Secondary | ICD-10-CM | POA: Diagnosis not present

## 2018-12-09 DIAGNOSIS — E7800 Pure hypercholesterolemia, unspecified: Secondary | ICD-10-CM | POA: Diagnosis not present

## 2018-12-09 DIAGNOSIS — K219 Gastro-esophageal reflux disease without esophagitis: Secondary | ICD-10-CM | POA: Diagnosis not present

## 2018-12-11 DIAGNOSIS — S22060A Wedge compression fracture of T7-T8 vertebra, initial encounter for closed fracture: Secondary | ICD-10-CM | POA: Diagnosis not present

## 2018-12-11 DIAGNOSIS — M546 Pain in thoracic spine: Secondary | ICD-10-CM | POA: Diagnosis not present

## 2018-12-11 DIAGNOSIS — S22070A Wedge compression fracture of T9-T10 vertebra, initial encounter for closed fracture: Secondary | ICD-10-CM | POA: Diagnosis not present

## 2018-12-11 DIAGNOSIS — S32010A Wedge compression fracture of first lumbar vertebra, initial encounter for closed fracture: Secondary | ICD-10-CM | POA: Diagnosis not present

## 2018-12-11 DIAGNOSIS — M549 Dorsalgia, unspecified: Secondary | ICD-10-CM | POA: Diagnosis not present

## 2018-12-11 DIAGNOSIS — S3992XA Unspecified injury of lower back, initial encounter: Secondary | ICD-10-CM | POA: Diagnosis not present

## 2018-12-13 ENCOUNTER — Other Ambulatory Visit: Payer: Self-pay

## 2018-12-13 ENCOUNTER — Emergency Department (HOSPITAL_COMMUNITY)
Admission: EM | Admit: 2018-12-13 | Discharge: 2018-12-14 | Disposition: A | Discharge status: Home / Self Care | Payer: Medicare HMO | Source: Home / Self Care | Attending: Emergency Medicine | Admitting: Emergency Medicine

## 2018-12-13 ENCOUNTER — Encounter (HOSPITAL_COMMUNITY): Payer: Self-pay

## 2018-12-13 DIAGNOSIS — Z1159 Encounter for screening for other viral diseases: Secondary | ICD-10-CM | POA: Diagnosis not present

## 2018-12-13 DIAGNOSIS — N63 Unspecified lump in unspecified breast: Secondary | ICD-10-CM | POA: Diagnosis not present

## 2018-12-13 DIAGNOSIS — E860 Dehydration: Secondary | ICD-10-CM | POA: Diagnosis present

## 2018-12-13 DIAGNOSIS — Z981 Arthrodesis status: Secondary | ICD-10-CM | POA: Diagnosis not present

## 2018-12-13 DIAGNOSIS — Z87891 Personal history of nicotine dependence: Secondary | ICD-10-CM | POA: Diagnosis not present

## 2018-12-13 DIAGNOSIS — I10 Essential (primary) hypertension: Secondary | ICD-10-CM | POA: Diagnosis not present

## 2018-12-13 DIAGNOSIS — R0602 Shortness of breath: Secondary | ICD-10-CM | POA: Diagnosis not present

## 2018-12-13 DIAGNOSIS — Z79890 Hormone replacement therapy (postmenopausal): Secondary | ICD-10-CM | POA: Diagnosis not present

## 2018-12-13 DIAGNOSIS — E039 Hypothyroidism, unspecified: Secondary | ICD-10-CM | POA: Diagnosis present

## 2018-12-13 DIAGNOSIS — K219 Gastro-esophageal reflux disease without esophagitis: Secondary | ICD-10-CM | POA: Diagnosis not present

## 2018-12-13 DIAGNOSIS — Z79899 Other long term (current) drug therapy: Secondary | ICD-10-CM | POA: Diagnosis not present

## 2018-12-13 DIAGNOSIS — X509XXA Other and unspecified overexertion or strenuous movements or postures, initial encounter: Secondary | ICD-10-CM | POA: Diagnosis not present

## 2018-12-13 DIAGNOSIS — Y92002 Bathroom of unspecified non-institutional (private) residence single-family (private) house as the place of occurrence of the external cause: Secondary | ICD-10-CM | POA: Diagnosis not present

## 2018-12-13 DIAGNOSIS — R350 Frequency of micturition: Secondary | ICD-10-CM | POA: Diagnosis present

## 2018-12-13 DIAGNOSIS — S22080A Wedge compression fracture of T11-T12 vertebra, initial encounter for closed fracture: Secondary | ICD-10-CM | POA: Diagnosis not present

## 2018-12-13 DIAGNOSIS — Z90710 Acquired absence of both cervix and uterus: Secondary | ICD-10-CM | POA: Diagnosis not present

## 2018-12-13 DIAGNOSIS — S22008A Other fracture of unspecified thoracic vertebra, initial encounter for closed fracture: Secondary | ICD-10-CM

## 2018-12-13 DIAGNOSIS — M4854XA Collapsed vertebra, not elsewhere classified, thoracic region, initial encounter for fracture: Secondary | ICD-10-CM | POA: Diagnosis present

## 2018-12-13 DIAGNOSIS — S22060A Wedge compression fracture of T7-T8 vertebra, initial encounter for closed fracture: Secondary | ICD-10-CM | POA: Diagnosis not present

## 2018-12-13 DIAGNOSIS — I480 Paroxysmal atrial fibrillation: Secondary | ICD-10-CM | POA: Diagnosis not present

## 2018-12-13 DIAGNOSIS — S22000A Wedge compression fracture of unspecified thoracic vertebra, initial encounter for closed fracture: Secondary | ICD-10-CM | POA: Diagnosis not present

## 2018-12-13 DIAGNOSIS — M546 Pain in thoracic spine: Secondary | ICD-10-CM | POA: Diagnosis not present

## 2018-12-13 DIAGNOSIS — X500XXA Overexertion from strenuous movement or load, initial encounter: Secondary | ICD-10-CM | POA: Insufficient documentation

## 2018-12-13 DIAGNOSIS — S32010A Wedge compression fracture of first lumbar vertebra, initial encounter for closed fracture: Secondary | ICD-10-CM | POA: Diagnosis not present

## 2018-12-13 DIAGNOSIS — S32019A Unspecified fracture of first lumbar vertebra, initial encounter for closed fracture: Secondary | ICD-10-CM | POA: Diagnosis not present

## 2018-12-13 DIAGNOSIS — Y9389 Activity, other specified: Secondary | ICD-10-CM | POA: Insufficient documentation

## 2018-12-13 DIAGNOSIS — Z79891 Long term (current) use of opiate analgesic: Secondary | ICD-10-CM | POA: Diagnosis not present

## 2018-12-13 DIAGNOSIS — Z03818 Encounter for observation for suspected exposure to other biological agents ruled out: Secondary | ICD-10-CM | POA: Diagnosis not present

## 2018-12-13 DIAGNOSIS — Y999 Unspecified external cause status: Secondary | ICD-10-CM | POA: Insufficient documentation

## 2018-12-13 DIAGNOSIS — I493 Ventricular premature depolarization: Secondary | ICD-10-CM | POA: Diagnosis present

## 2018-12-13 DIAGNOSIS — S22070A Wedge compression fracture of T9-T10 vertebra, initial encounter for closed fracture: Secondary | ICD-10-CM | POA: Diagnosis not present

## 2018-12-13 DIAGNOSIS — K5900 Constipation, unspecified: Secondary | ICD-10-CM | POA: Diagnosis not present

## 2018-12-13 DIAGNOSIS — Z823 Family history of stroke: Secondary | ICD-10-CM | POA: Diagnosis not present

## 2018-12-13 DIAGNOSIS — S22009A Unspecified fracture of unspecified thoracic vertebra, initial encounter for closed fracture: Secondary | ICD-10-CM | POA: Diagnosis not present

## 2018-12-13 DIAGNOSIS — Z87442 Personal history of urinary calculi: Secondary | ICD-10-CM | POA: Diagnosis not present

## 2018-12-13 MED ORDER — OXYCODONE-ACETAMINOPHEN 5-325 MG PO TABS
1.0000 | ORAL_TABLET | Freq: Once | ORAL | Status: AC
  Administered 2018-12-13: 1 via ORAL
  Filled 2018-12-13: qty 1

## 2018-12-13 NOTE — ED Triage Notes (Signed)
Pt reports that her husband fell in the shower on Monday and she hurt her back helping him up. She states that she was seen at [facility] and they took x rays and gave her oxycodone. She was feeling constipated, but used a suppository PTA and had relief, but noticed a little bit of blood. Pt also states that she is feeling SOB and feels like she cant get a good breath d/t the back pain.

## 2018-12-14 ENCOUNTER — Emergency Department (HOSPITAL_COMMUNITY): Payer: Medicare HMO

## 2018-12-14 LAB — URINALYSIS, ROUTINE W REFLEX MICROSCOPIC
Bacteria, UA: NONE SEEN
Bilirubin Urine: NEGATIVE
Glucose, UA: NEGATIVE mg/dL
Hgb urine dipstick: NEGATIVE
Ketones, ur: NEGATIVE mg/dL
Nitrite: NEGATIVE
Protein, ur: NEGATIVE mg/dL
Specific Gravity, Urine: 1.012 (ref 1.005–1.030)
pH: 5 (ref 5.0–8.0)

## 2018-12-14 LAB — CBC WITH DIFFERENTIAL/PLATELET
Abs Immature Granulocytes: 0.01 10*3/uL (ref 0.00–0.07)
Basophils Absolute: 0 10*3/uL (ref 0.0–0.1)
Basophils Relative: 1 %
Eosinophils Absolute: 0.2 10*3/uL (ref 0.0–0.5)
Eosinophils Relative: 2 %
HCT: 43 % (ref 36.0–46.0)
Hemoglobin: 13.5 g/dL (ref 12.0–15.0)
Immature Granulocytes: 0 %
Lymphocytes Relative: 16 %
Lymphs Abs: 1.3 10*3/uL (ref 0.7–4.0)
MCH: 29.5 pg (ref 26.0–34.0)
MCHC: 31.4 g/dL (ref 30.0–36.0)
MCV: 94.1 fL (ref 80.0–100.0)
Monocytes Absolute: 0.8 10*3/uL (ref 0.1–1.0)
Monocytes Relative: 10 %
Neutro Abs: 5.8 10*3/uL (ref 1.7–7.7)
Neutrophils Relative %: 71 %
Platelets: 176 10*3/uL (ref 150–400)
RBC: 4.57 MIL/uL (ref 3.87–5.11)
RDW: 13 % (ref 11.5–15.5)
WBC: 8.1 10*3/uL (ref 4.0–10.5)
nRBC: 0 % (ref 0.0–0.2)

## 2018-12-14 LAB — BASIC METABOLIC PANEL
Anion gap: 8 (ref 5–15)
BUN: 13 mg/dL (ref 8–23)
CO2: 27 mmol/L (ref 22–32)
Calcium: 8.8 mg/dL — ABNORMAL LOW (ref 8.9–10.3)
Chloride: 105 mmol/L (ref 98–111)
Creatinine, Ser: 0.71 mg/dL (ref 0.44–1.00)
GFR calc Af Amer: >60 mL/min (ref >60)
GFR calc non Af Amer: >60 mL/min (ref >60)
Glucose, Bld: 117 mg/dL — ABNORMAL HIGH (ref 70–99)
Potassium: 3.9 mmol/L (ref 3.5–5.1)
Sodium: 140 mmol/L (ref 135–145)

## 2018-12-14 MED ORDER — FENTANYL CITRATE (PF) 100 MCG/2ML IJ SOLN
50.0000 ug | Freq: Once | INTRAMUSCULAR | Status: DC
  Filled 2018-12-14: qty 2

## 2018-12-14 MED ORDER — HYDROCODONE-ACETAMINOPHEN 5-325 MG PO TABS
1.0000 | ORAL_TABLET | Freq: Once | ORAL | Status: AC
  Administered 2018-12-14: 1 via ORAL
  Filled 2018-12-14: qty 1

## 2018-12-14 NOTE — ED Notes (Signed)
Patient ambulated length of hall from room 6 to Xray and back to her room without assistance

## 2018-12-14 NOTE — Discharge Instructions (Addendum)
You can take up to 2 of your pain pills 4 times a day for pain. I suggest trying 1 1/2 to see if it offers a good balance of pain control without significant drowsiness or confusion.  I also suggest you start taking colace twice a day while on narcotic pain medications

## 2018-12-14 NOTE — ED Provider Notes (Signed)
Emergency Department Provider Note   I have reviewed the triage vital signs and the nursing notes.   HISTORY  Chief Complaint Back Pain   HPI Jill LAZENBY is a 85 y.o. female with medical problems as documented below who presents to the emergency department with ongoing back pain.  Patient states that her husband fell in the shower when she tried to help him get up on Monday and hurt her back.  She saw her doctor that day and they started her on a muscle relaxer which did not seem to help very much.  She went back on Wednesday they did x-rays and she had "a bunch of fractures" but she was unsure where they were or what what they were all she knows they scheduled her for neurosurgery and started on Vicodin.  Since that time she started having some constipation, shortness of breath but the pain does seem to get a little bit better with the hydrocodone.  She states that she pushes on her abdomen seems to help her breathing quite a bit and she does not get as short of breath.  She did a suppository prior to coming in and this relieved her constipation with a mild bright red blood with it.  No abdominal pain.  No fevers no cough.  She has a history of smoking but states her oxygen saturations usually in the mid 90s and they have been running around 93 recently.   No other associated or modifying symptoms.    Past Medical History:  Diagnosis Date  . Arrhythmia 02/06/2012   hx of paroxysmal a fib with no recurrence,rare palpations  . Breast lump   . GERD (gastroesophageal reflux disease)   . Hypertension 2013   good control  . Kidney stones    history of  . Thyroid disease     Patient Active Problem List   Diagnosis Date Noted  . PVC's (premature ventricular contractions) 11/05/2017  . Palpitations 11/17/2013  . Syncope, near 11/17/2013  . Paroxysmal atrial fibrillation ([place]) 02/26/2013  . Breast cyst 10/25/2012  . Breast mass, right 10/21/2012    Past Surgical History:   Procedure Laterality Date  . ABDOMINAL HYSTERECTOMY    . NM MYOCAR PERF WALL MOTION  Feb 19 2008   negative for ischemia  . SUPRAVENTRICULAR TACHYCARDIA ABLATION      Current Outpatient Rx  . Order #: [id number] Class: Historical Med  . Order #: [id number] Class: Historical Med  . Order #: [id number] Class: Historical Med  . Order #: [id number] Class: Normal  . Order #: [id number] Class: Historical Med  . Order #: [id number] Class: Historical Med  . Order #: [id number] Class: Historical Med  . Order #: [id number] Class: Historical Med  . Order #: [id number] Class: Historical Med  . Order #: [id number] Class: Historical Med  . Order #: [id number] Class: Historical Med    Allergies Metoprolol, Latex, and Sulfa antibiotics  Family History  Problem Relation Age of Onset  . Cancer Mother   . Stroke Father   . Cancer Brother     Social History Social History   Tobacco Use  . Smoking status: Former Research scientist (life sciences)  . Smokeless tobacco: Never Used  Substance Use Topics  . Alcohol use: No    Alcohol/week: 0.0 standard drinks  . Drug use: No    Review of Systems  All other systems negative except as documented in the HPI. All pertinent positives and negatives as reviewed in the HPI. ____________________________________________   PHYSICAL EXAM:  VITAL SIGNS: ED Triage Vitals  Enc Vitals  Group     BP 12/13/18 2152 (!) 158/72     Pulse Rate 12/13/18 2152 90     Resp 12/13/18 2152 18     Temp 12/13/18 2152 98.5 F (36.9 C)     Temp Source 12/13/18 2152 Oral     SpO2 12/13/18 2152 94 %     Weight 12/13/18 2152 114 lb (51.7 kg)     Height 12/13/18 2152 5\' 5"  (1.651 m)    Constitutional: Alert and oriented. Well appearing and in no acute distress. Eyes: Conjunctivae are normal. PERRL. EOMI. Head: Atraumatic. Nose: No congestion/rhinnorhea. Mouth/Throat: Mucous membranes are moist.  Oropharynx non-erythematous. Neck: No stridor.  No meningeal signs.   Cardiovascular: Normal rate, regular rhythm.  Good peripheral circulation. Grossly normal heart sounds.   Respiratory: Normal respiratory effort.  No retractions. Lungs CTAB. Gastrointestinal: Soft and nontender. No distention.  Musculoskeletal: No lower extremity tenderness nor edema. No gross deformities of extremities. Mid thoracic spinal ttp without stepoff. No paraspinal pain but states the midline pain seems to radiate towards the muscles. Neurologic:  Normal speech and language. No gross focal neurologic deficits are appreciated.  Skin:  Skin is warm, dry and intact. No rash noted.   ____________________________________________   LABS (all labs ordered are listed, but only abnormal results are displayed)  Labs Reviewed  URINALYSIS, ROUTINE W REFLEX MICROSCOPIC - Abnormal; Notable for the following components:      Result Value   Leukocytes,Ua SMALL (*)    All other components within normal limits  BASIC METABOLIC PANEL - Abnormal; Notable for the following components:   Glucose, Bld 117 (*)    Calcium 8.8 (*)    All other components within normal limits  URINE CULTURE  CBC WITH DIFFERENTIAL/PLATELET   ____________________________________________  EKG   EKG Interpretation  Date/Time:  Saturday December 14 2018 00:00:20 EDT Ventricular Rate:  80 PR Interval:    QRS Duration: 88 QT Interval:  399 QTC Calculation: 461 R Axis:   35 Text Interpretation:  Sinus rhythm significant baseline wander but no obvious change from  2017 Confirmed by Merrily Pew [phone]) on 12/14/2018 12:10:12 AM       ____________________________________________  RADIOLOGY  Dg Chest 2 View  Result Date: 12/14/2018 CLINICAL DATA:  Shortness of breath. EXAM: CHEST - 2 VIEW COMPARISON:  PA and lateral chest 09/19/2015. FINDINGS: The lungs are clear. No pneumothorax or pleural effusion. Heart size is normal. Aortic atherosclerosis is noted. No acute bony abnormality. Remote T10, T12 and L1 compression fractures noted. IMPRESSION: No acute  disease. Electronically Signed   By: Inge Rise M.D.   On: 12/14/2018 00:52    ____________________________________________   PROCEDURES  Procedure(s) performed:   Procedures   ____________________________________________   INITIAL IMPRESSION / ASSESSMENT AND PLAN / ED COURSE  This shortness of breath very well could just be from the constipation and splinting however I did see her oxygen saturation get as low as 89% while she was talking.  She does not appear visibly distressed or tachypneic.  Lungs are clear.  We will check an x-ray of her chest which also hopefully elucidate the fractures that are in her back but she does not want me to repeat those x-rays.  Also get an EKG and basic labs that she does have a history of anemia to make sure that does not associated with a dyspnea.  If all this is okay I will probably start her on Colace to go along with her MiraLAX and increase  her home pain control. Already has NSG appointment coming up to address fractures.  Brace placed. Will increase hydrocodone to help with pain meds, continue to follow up with nsg as placed.   Granddaughter updated via phone per patient request.   Pertinent labs & imaging results that were available during my care of the patient were reviewed by me and considered in my medical decision making (see chart for details).  A medical screening exam was performed and I feel the patient has had an appropriate workup for their chief complaint at this time and likelihood of emergent condition existing is low. They have been counseled on decision, discharge, follow up and which symptoms necessitate immediate return to the emergency department. They or their family verbally stated understanding and agreement with plan and discharged in stable condition.   ____________________________________________  FINAL CLINICAL IMPRESSION(S) / ED DIAGNOSES  Final diagnoses:  Other closed fracture of thoracic vertebra,  unspecified thoracic vertebral level, initial encounter ([place])    MEDICATIONS GIVEN DURING THIS VISIT:  Medications  oxyCODONE-acetaminophen (PERCOCET/ROXICET) 5-325 MG per tablet 1 tablet (1 tablet Oral Given 12/13/18 2355)  HYDROcodone-acetaminophen (NORCO/VICODIN) 5-325 MG per tablet 1 tablet (1 tablet Oral Given 12/14/18 0335)    NEW OUTPATIENT MEDICATIONS STARTED DURING THIS VISIT:  Discharge Medication List as of 12/14/2018  3:27 AM      Note:  This note was prepared with assistance of Dragon voice recognition software. Occasional wrong-word or sound-a-like substitutions may have occurred due to the inherent limitations of voice recognition software.   Mozes Sagar, Corene Cornea, MD 12/14/18 [phone]

## 2018-12-14 NOTE — ED Notes (Signed)
Ortho placed TLSO brace on patient and educated patient on how to use, take off and re-apply

## 2018-12-15 ENCOUNTER — Encounter (HOSPITAL_COMMUNITY): Payer: Self-pay | Admitting: Emergency Medicine

## 2018-12-15 ENCOUNTER — Other Ambulatory Visit: Payer: Self-pay

## 2018-12-15 ENCOUNTER — Emergency Department (HOSPITAL_COMMUNITY): Payer: Medicare HMO

## 2018-12-15 ENCOUNTER — Inpatient Hospital Stay (HOSPITAL_COMMUNITY)
Admission: EM | Admit: 2018-12-15 | Discharge: 2018-12-19 | DRG: 517 | Disposition: A | Discharge status: Home Health | Payer: Medicare HMO | Attending: Internal Medicine | Admitting: Internal Medicine

## 2018-12-15 DIAGNOSIS — Y92002 Bathroom of unspecified non-institutional (private) residence single-family (private) house as the place of occurrence of the external cause: Secondary | ICD-10-CM | POA: Diagnosis not present

## 2018-12-15 DIAGNOSIS — I493 Ventricular premature depolarization: Secondary | ICD-10-CM | POA: Diagnosis present

## 2018-12-15 DIAGNOSIS — E860 Dehydration: Secondary | ICD-10-CM | POA: Diagnosis present

## 2018-12-15 DIAGNOSIS — Z79890 Hormone replacement therapy (postmenopausal): Secondary | ICD-10-CM

## 2018-12-15 DIAGNOSIS — I10 Essential (primary) hypertension: Secondary | ICD-10-CM | POA: Diagnosis present

## 2018-12-15 DIAGNOSIS — Z79899 Other long term (current) drug therapy: Secondary | ICD-10-CM | POA: Diagnosis not present

## 2018-12-15 DIAGNOSIS — Z87442 Personal history of urinary calculi: Secondary | ICD-10-CM

## 2018-12-15 DIAGNOSIS — M546 Pain in thoracic spine: Secondary | ICD-10-CM | POA: Diagnosis not present

## 2018-12-15 DIAGNOSIS — E039 Hypothyroidism, unspecified: Secondary | ICD-10-CM | POA: Diagnosis present

## 2018-12-15 DIAGNOSIS — S22060A Wedge compression fracture of T7-T8 vertebra, initial encounter for closed fracture: Secondary | ICD-10-CM

## 2018-12-15 DIAGNOSIS — Z79891 Long term (current) use of opiate analgesic: Secondary | ICD-10-CM

## 2018-12-15 DIAGNOSIS — R350 Frequency of micturition: Secondary | ICD-10-CM | POA: Diagnosis present

## 2018-12-15 DIAGNOSIS — S22000A Wedge compression fracture of unspecified thoracic vertebra, initial encounter for closed fracture: Secondary | ICD-10-CM | POA: Diagnosis present

## 2018-12-15 DIAGNOSIS — X509XXA Other and unspecified overexertion or strenuous movements or postures, initial encounter: Secondary | ICD-10-CM | POA: Diagnosis not present

## 2018-12-15 DIAGNOSIS — K219 Gastro-esophageal reflux disease without esophagitis: Secondary | ICD-10-CM | POA: Diagnosis present

## 2018-12-15 DIAGNOSIS — Z1159 Encounter for screening for other viral diseases: Secondary | ICD-10-CM

## 2018-12-15 DIAGNOSIS — M4854XA Collapsed vertebra, not elsewhere classified, thoracic region, initial encounter for fracture: Principal | ICD-10-CM | POA: Diagnosis present

## 2018-12-15 DIAGNOSIS — I480 Paroxysmal atrial fibrillation: Secondary | ICD-10-CM | POA: Diagnosis present

## 2018-12-15 DIAGNOSIS — Z419 Encounter for procedure for purposes other than remedying health state, unspecified: Secondary | ICD-10-CM

## 2018-12-15 DIAGNOSIS — Z823 Family history of stroke: Secondary | ICD-10-CM | POA: Diagnosis not present

## 2018-12-15 DIAGNOSIS — K5900 Constipation, unspecified: Secondary | ICD-10-CM | POA: Diagnosis present

## 2018-12-15 DIAGNOSIS — Z87891 Personal history of nicotine dependence: Secondary | ICD-10-CM | POA: Diagnosis not present

## 2018-12-15 DIAGNOSIS — Z90710 Acquired absence of both cervix and uterus: Secondary | ICD-10-CM | POA: Diagnosis not present

## 2018-12-15 DIAGNOSIS — M549 Dorsalgia, unspecified: Secondary | ICD-10-CM

## 2018-12-15 LAB — URINE CULTURE: Culture: <10000 — AB

## 2018-12-15 LAB — COMPREHENSIVE METABOLIC PANEL
ALT: 19 U/L (ref 0–44)
AST: 22 U/L (ref 15–41)
Albumin: 4.5 g/dL (ref 3.5–5.0)
Alkaline Phosphatase: 75 U/L (ref 38–126)
Anion gap: 11 (ref 5–15)
BUN: 10 mg/dL (ref 8–23)
CO2: 26 mmol/L (ref 22–32)
Calcium: 9.2 mg/dL (ref 8.9–10.3)
Chloride: 100 mmol/L (ref 98–111)
Creatinine, Ser: 0.61 mg/dL (ref 0.44–1.00)
GFR calc Af Amer: >60 mL/min (ref >60)
GFR calc non Af Amer: >60 mL/min (ref >60)
Glucose, Bld: 127 mg/dL — ABNORMAL HIGH (ref 70–99)
Potassium: 3.8 mmol/L (ref 3.5–5.1)
Sodium: 137 mmol/L (ref 135–145)
Total Bilirubin: 1.5 mg/dL — ABNORMAL HIGH (ref 0.3–1.2)
Total Protein: 7.7 g/dL (ref 6.5–8.1)

## 2018-12-15 LAB — URINALYSIS, ROUTINE W REFLEX MICROSCOPIC
Bilirubin Urine: NEGATIVE
Glucose, UA: NEGATIVE mg/dL
Hgb urine dipstick: NEGATIVE
Ketones, ur: 20 mg/dL — AB
Leukocytes,Ua: NEGATIVE
Nitrite: NEGATIVE
Protein, ur: NEGATIVE mg/dL
Specific Gravity, Urine: 1.017 (ref 1.005–1.030)
pH: 5 (ref 5.0–8.0)

## 2018-12-15 LAB — CBC WITH DIFFERENTIAL/PLATELET
Abs Immature Granulocytes: 0.03 10*3/uL (ref 0.00–0.07)
Basophils Absolute: 0 10*3/uL (ref 0.0–0.1)
Basophils Relative: 0 %
Eosinophils Absolute: 0 10*3/uL (ref 0.0–0.5)
Eosinophils Relative: 0 %
HCT: 46.8 % — ABNORMAL HIGH (ref 36.0–46.0)
Hemoglobin: 15.1 g/dL — ABNORMAL HIGH (ref 12.0–15.0)
Immature Granulocytes: 0 %
Lymphocytes Relative: 7 %
Lymphs Abs: 0.7 10*3/uL (ref 0.7–4.0)
MCH: 30 pg (ref 26.0–34.0)
MCHC: 32.3 g/dL (ref 30.0–36.0)
MCV: 92.9 fL (ref 80.0–100.0)
Monocytes Absolute: 0.6 10*3/uL (ref 0.1–1.0)
Monocytes Relative: 6 %
Neutro Abs: 8.6 10*3/uL — ABNORMAL HIGH (ref 1.7–7.7)
Neutrophils Relative %: 87 %
Platelets: 201 10*3/uL (ref 150–400)
RBC: 5.04 MIL/uL (ref 3.87–5.11)
RDW: 12.9 % (ref 11.5–15.5)
WBC: 10 10*3/uL (ref 4.0–10.5)
nRBC: 0 % (ref 0.0–0.2)

## 2018-12-15 LAB — SARS CORONAVIRUS 2 BY RT PCR (HOSPITAL ORDER, PERFORMED IN ~~LOC~~ HOSPITAL LAB): SARS Coronavirus 2: NEGATIVE

## 2018-12-15 MED ORDER — HYDROCODONE-ACETAMINOPHEN 7.5-325 MG PO TABS
1.0000 | ORAL_TABLET | Freq: Four times a day (QID) | ORAL | Status: DC | PRN
  Administered 2018-12-15 – 2018-12-18 (×4): 1 via ORAL
  Filled 2018-12-15 (×5): qty 1

## 2018-12-15 MED ORDER — POLYETHYLENE GLYCOL 3350 17 G PO PACK
17.0000 g | PACK | Freq: Every day | ORAL | Status: DC
  Administered 2018-12-15 – 2018-12-19 (×4): 17 g via ORAL
  Filled 2018-12-15 (×4): qty 1

## 2018-12-15 MED ORDER — METHOCARBAMOL 1000 MG/10ML IJ SOLN
500.0000 mg | Freq: Three times a day (TID) | INTRAVENOUS | Status: DC | PRN
  Administered 2018-12-16: 500 mg via INTRAVENOUS
  Filled 2018-12-15 (×2): qty 5

## 2018-12-15 MED ORDER — ONDANSETRON HCL 4 MG/2ML IJ SOLN
4.0000 mg | Freq: Once | INTRAMUSCULAR | Status: AC
  Administered 2018-12-15: 4 mg via INTRAVENOUS
  Filled 2018-12-15: qty 2

## 2018-12-15 MED ORDER — SODIUM CHLORIDE 0.9 % IV BOLUS
1000.0000 mL | Freq: Once | INTRAVENOUS | Status: AC
  Administered 2018-12-15: 1000 mL via INTRAVENOUS

## 2018-12-15 MED ORDER — METHYLPREDNISOLONE SODIUM SUCC 125 MG IJ SOLR
80.0000 mg | Freq: Once | INTRAMUSCULAR | Status: AC
  Administered 2018-12-15: 80 mg via INTRAVENOUS
  Filled 2018-12-15: qty 2

## 2018-12-15 MED ORDER — DILTIAZEM HCL ER COATED BEADS 180 MG PO CP24
180.0000 mg | ORAL_CAPSULE | Freq: Every day | ORAL | Status: DC
  Administered 2018-12-15 – 2018-12-19 (×5): 180 mg via ORAL
  Filled 2018-12-15 (×6): qty 1

## 2018-12-15 MED ORDER — MORPHINE SULFATE (PF) 2 MG/ML IV SOLN
2.0000 mg | INTRAVENOUS | Status: DC | PRN
  Administered 2018-12-15 – 2018-12-18 (×11): 2 mg via INTRAVENOUS
  Filled 2018-12-15 (×12): qty 1

## 2018-12-15 MED ORDER — POLYETHYLENE GLYCOL 3350 17 GM/SCOOP PO POWD: 17.0000 g | Freq: Every day | ORAL | Status: DC

## 2018-12-15 MED ORDER — PANTOPRAZOLE SODIUM 40 MG PO TBEC
40.0000 mg | DELAYED_RELEASE_TABLET | Freq: Every day | ORAL | Status: DC
  Administered 2018-12-15 – 2018-12-19 (×5): 40 mg via ORAL
  Filled 2018-12-15 (×5): qty 1

## 2018-12-15 MED ORDER — MORPHINE SULFATE (PF) 2 MG/ML IV SOLN
2.0000 mg | Freq: Once | INTRAVENOUS | Status: AC
  Administered 2018-12-15: 2 mg via INTRAVENOUS
  Filled 2018-12-15: qty 1

## 2018-12-15 MED ORDER — LEVOTHYROXINE SODIUM 50 MCG PO TABS
50.0000 ug | ORAL_TABLET | Freq: Every day | ORAL | Status: DC
  Administered 2018-12-16 – 2018-12-19 (×4): 50 ug via ORAL
  Filled 2018-12-15 (×5): qty 1

## 2018-12-15 MED ORDER — ONDANSETRON HCL 4 MG/2ML IJ SOLN: 4.0000 mg | Freq: Four times a day (QID) | INTRAMUSCULAR | Status: DC | PRN

## 2018-12-15 NOTE — ED Provider Notes (Signed)
[facility] DEPT Provider Note   CSN: [id number] Arrival date & time: 12/15/18  6644    History   Chief Complaint Chief Complaint  Patient presents with   Back Pain   Foot Swelling    HPI Jill Mcdowell is a 85 y.o. female.     HPI   Jill Mcdowell is a 85 y.o. female, with a history of HTN, GERD, and palpitations, presenting to the ED with back pain beginning June 16.  She states June 15 her husband fell in the shower, she was trying to lift him, held him upright until their son arrived.  She did not have immediate pain, rather her pain began gradually and has continued to worsen.   Her pain is across her thoracic back in a bandlike distribution, equally on the right and left, currently 8/10, worse with movement, lying supine, or deep breathing, burning in nature.  Swollen ankles: She states she has been sitting up and sleeping in a chair for the last several days, which has made her ankles and feet swollen.  She does not have any pain, erythema, or increased warmth to the lower extremities.  Denies shortness of breath, orthopnea, chest pain.  Nausea: She states the hydrocodone has made her nauseous.  This nausea has prevented her from taking some of her other medications.  She also has had poor oral intake.  Denies vomiting or diarrhea.  Constipation: She states hydrocodone has also made her constipated.  Last bowel movement was 2 days ago.  She endorses some abdominal distention.  Urinary discomfort: She endorses occasional dysuria and urinary frequency over the past week.  Denies fever/chills, hematochezia/melena, dizziness, syncope, falls/trauma, extremity weakness/numbness, or any other complaints.  Recent history: June 16: She was seen by her PCP and prescribed Zanaflex, which she states did not help. June 17: Patient was again seen by her PCP.  X-rays were obtained and she was told she had a thoracic compression fracture not seen on  previous imaging.  She was then prescribed hydrocodone/APAP.  She was taking this medication as prescribed, 1 tablet every 6 hours, without significant improvement. June 19: Patient was seen in the ED for her back pain.  Her chest x-ray was without acute abnormality.  She declined repeat imaging of her spine.  She has been seen by the spinal surgical center in the past due to previous compression fractures and the plan was for her to follow-up with them on this issue.  For previous compression fractures, she has been recommended for kyphoplasty, but has declined stating she felt the risks were too great. Per EDP recommendation, she also increased her dosage of hydrocodone to 1.5 tablets per dose, but this has not improved her pain.        Past Medical History:  Diagnosis Date   Arrhythmia 02/06/2012   hx of paroxysmal a fib with no recurrence,rare palpations   Breast lump    GERD (gastroesophageal reflux disease)    Hypertension 2013   good control   Kidney stones    history of   Thyroid disease     Patient Active Problem List   Diagnosis Date Noted   Compression fracture of body of thoracic vertebra ([place]) 12/15/2018   Back pain 12/15/2018   PVC's (premature ventricular contractions) 11/05/2017   Palpitations 11/17/2013   Syncope, near 11/17/2013   Paroxysmal atrial fibrillation ([place]) 02/26/2013   Breast cyst 10/25/2012   Breast mass, right 10/21/2012    Past Surgical History:  Procedure Laterality Date   ABDOMINAL HYSTERECTOMY     NM MYOCAR PERF WALL MOTION  Feb 19 2008   negative for ischemia   SUPRAVENTRICULAR TACHYCARDIA ABLATION       OB History   No obstetric history on file.      Home Medications    Prior to Admission medications   Medication Sig Start Date End Date Taking? Authorizing Provider  acetaminophen (TYLENOL) 500 MG tablet Take 500 mg by mouth every 8 (eight) hours as needed for headache.    Yes [provider]    Brimonidine Tartrate (LUMIFY) 0.025 % SOLN Apply 1 drop to eye daily.   Yes [provider]  calcium carbonate (TUMS) 500 MG chewable tablet Chew 3 tablets by mouth daily as needed for indigestion or heartburn.    Yes [provider]  Cholecalciferol (VITAMIN D-3) 5000 UNITS TABS Take 10,000 Units by mouth every Friday.    Yes [provider]  DILT-XR 180 MG 24 hr capsule TAKE 1 CAPSULE EVERY DAY Patient taking differently: Take 180 mg by mouth daily.  10/26/18  Yes Croitoru, Mihai, MD  HYDROcodone-acetaminophen (NORCO/VICODIN) 5-325 MG tablet Take 1 tablet by mouth every 6 (six) hours as needed for moderate pain.  12/11/18  Yes [provider]  iron polysaccharides (NU-IRON) 150 MG capsule Take 150 mg by mouth 2 (two) times a week.    Yes [provider]  levothyroxine (SYNTHROID, LEVOTHROID) 50 MCG tablet Take 50 mcg by mouth daily before breakfast.   Yes [provider]  Multiple Vitamins-Minerals (PRESERVISION AREDS PO) Take 1 capsule by mouth 2 (two) times daily.    Yes [provider]  omeprazole (PRILOSEC) 20 MG capsule Take 20 mg by mouth daily with lunch.    Yes [provider]  polyethylene glycol powder (GLYCOLAX/MIRALAX) 17 GM/SCOOP powder Take 17 g by mouth daily.   Yes [provider]  tiZANidine (ZANAFLEX) 2 MG tablet Take 2 mg by mouth every 8 (eight) hours as needed for muscle spasms.  12/09/18  Yes [provider]    Family History Family History  Problem Relation Age of Onset   Cancer Mother    Stroke Father    Cancer Brother     Social History Social History   Tobacco Use   Smoking status: Former Smoker   Smokeless tobacco: Never Used  Substance Use Topics   Alcohol use: No    Alcohol/week: 0.0 standard drinks   Drug use: No     Allergies   Metoprolol, Latex, and Sulfa antibiotics   Review of Systems Review of Systems  Constitutional: Negative for chills and  fever.  Respiratory: Negative for cough and shortness of breath.   Cardiovascular: Positive for leg swelling. Negative for chest pain.  Gastrointestinal: Positive for abdominal distention, constipation and nausea. Negative for abdominal pain, blood in stool, diarrhea and vomiting.  Genitourinary: Positive for dysuria and frequency. Negative for difficulty urinating, flank pain and vaginal bleeding.  Musculoskeletal: Positive for back pain.  Neurological: Negative for dizziness, syncope, weakness, light-headedness and numbness.  All other systems reviewed and are negative.    Physical Exam Updated Vital Signs BP (!) 166/88 (BP Location: Right Arm)    Pulse 98    Temp 98.5 F (36.9 C) (Oral)    Resp 17    SpO2 95%   Physical Exam Vitals signs and nursing note reviewed.  Constitutional:      General: She is not in acute distress.  Appearance: She is well-developed. She is not diaphoretic.  HENT:     Head: Normocephalic and atraumatic.     Mouth/Throat:     Mouth: Mucous membranes are moist.     Pharynx: Oropharynx is clear.  Eyes:     Conjunctiva/sclera: Conjunctivae normal.  Neck:     Musculoskeletal: Neck supple.  Cardiovascular:     Rate and Rhythm: Normal rate and regular rhythm.     Pulses: Normal pulses.          Radial pulses are 2+ on the right side and 2+ on the left side.       Posterior tibial pulses are 2+ on the right side and 2+ on the left side.     Heart sounds: Normal heart sounds.     Comments: Tactile temperature in the extremities appropriate and equal bilaterally. Pulmonary:     Effort: Pulmonary effort is normal. No respiratory distress.     Breath sounds: Normal breath sounds.  Abdominal:     General: There is distension.     Palpations: Abdomen is soft.     Tenderness: There is no abdominal tenderness. There is no guarding.     Comments: There seems to be some mild abdominal distention, however, abdomen soft and nontender.  Musculoskeletal:      Right lower leg: Edema present.     Left lower leg: Edema present.     Comments: Palpation and inspection of the patient's back revealed no tenderness, swelling, instability, or deformities.  No pain with movement of the upper or lower extremities.  Normal motor function intact in all extremities. No midline spinal tenderness.   There is some edema to the bilateral lower extremities from the feet into the ankles.  Equal bilaterally.  No tenderness, increased warmth, or erythema.  Lymphadenopathy:     Cervical: No cervical adenopathy.  Skin:    General: Skin is warm and dry.  Neurological:     Mental Status: She is alert.     Comments: Sensation grossly intact to light touch in the extremities.  Grip strengths equal bilaterally.  Strength 5/5 in all extremities. No gait disturbance. Coordination intact. Cranial nerves III-XII grossly intact. No facial droop.   Psychiatric:        Mood and Affect: Mood and affect normal.        Speech: Speech normal.        Behavior: Behavior normal.      ED Treatments / Results  Labs (all labs ordered are listed, but only abnormal results are displayed) Labs Reviewed  URINALYSIS, ROUTINE W REFLEX MICROSCOPIC - Abnormal; Notable for the following components:      Result Value   Ketones, ur 20 (*)    All other components within normal limits  COMPREHENSIVE METABOLIC PANEL - Abnormal; Notable for the following components:   Glucose, Bld 127 (*)    Total Bilirubin 1.5 (*)    All other components within normal limits  CBC WITH DIFFERENTIAL/PLATELET - Abnormal; Notable for the following components:   Hemoglobin 15.1 (*)    HCT 46.8 (*)    Neutro Abs 8.6 (*)    All other components within normal limits  URINE CULTURE  SARS CORONAVIRUS 2 (HOSPITAL ORDER, [facility])    EKG None  Radiology Dg Chest 2 View  Result Date: 12/14/2018 CLINICAL DATA:  Shortness of breath. EXAM: CHEST - 2 VIEW COMPARISON:  PA and lateral  chest 09/19/2015. FINDINGS: The lungs are clear. No  pneumothorax or pleural effusion. Heart size is normal. Aortic atherosclerosis is noted. No acute bony abnormality. Remote T10, T12 and L1 compression fractures noted. IMPRESSION: No acute disease. Electronically Signed   By: Inge Rise M.D.   On: 12/14/2018 00:52   Ct Thoracic Spine Wo Contrast  Result Date: 12/15/2018 CLINICAL DATA:  Severe back pain since Monday. Injured while trying to help husband from falling. EXAM: CT THORACIC SPINE WITHOUT CONTRAST TECHNIQUE: Multidetector CT images of the thoracic were obtained using the standard protocol without intravenous contrast. COMPARISON:  MRI thoracic spine 09/19/2015 FINDINGS: Alignment: Exaggerated thoracic kyphosis but the vertebral bodies are normally aligned. Vertebrae: There is a remote T10 compression fracture, markedly progressive since 2017 with vertebral plana appearance. Mild retropulsion but no significant canal compromise. Acute appearing severe compression fracture of T7 with minimal retropulsion but no canal compromise. Compression is estimated at 60% and there is paraspinal hematoma. Compression fracture of L1 involving the superior endplate. This was not present on prior lumbar radiographs from 02/18/2018. I do not see any obvious paraspinal hematoma and I do not think this is an acute fracture. Paraspinal and other soft tissues: Paraspinal hematoma noted at T7. There are small bilateral pleural effusions and bibasilar atelectasis. Aortic calcifications without definite aneurysm. The visualized posterior ribs are intact. Disc levels: Minimal retropulsion at T7 mainly involving the inferior aspect of the vertebral body. There also appears to be a faintly calcified disc protrusion but the spinal canal is generous in is no significant spinal stenosis. Mild retropulsion at T10 with mild canal encroachment and mild spinal stenosis. Mild retropulsion of the posterosuperior aspect of L1 with  mild canal encroachment but no significant stenosis. No spinal canal hematoma IMPRESSION: 1. Acute appearing and severe compression fracture of T7 with paraspinal hematoma. No significant retropulsion or canal compromise. 2. Remote but progressive T10 compression fracture. No significant change since lumbar radiographs from 2019. Mild retropulsion and mild canal compromise. 3. Indeterminate age L1 superior endplate fracture with mild retropulsion. 4. MRI may be helpful for further evaluation. Electronically Signed   By: Marijo Sanes M.D.   On: 12/15/2018 11:33    Procedures Procedures (including critical care time)  Medications Ordered in ED Medications  morphine 2 MG/ML injection 2 mg (has no administration in time range)  HYDROcodone-acetaminophen (NORCO) 7.5-325 MG per tablet 1 tablet (has no administration in time range)  ondansetron (ZOFRAN) injection 4 mg (has no administration in time range)  methocarbamol (ROBAXIN) 500 mg in dextrose 5 % 50 mL IVPB (has no administration in time range)  diltiazem (DILACOR XR) 24 hr capsule 180 mg (has no administration in time range)  levothyroxine (SYNTHROID) tablet 50 mcg (has no administration in time range)  pantoprazole (PROTONIX) EC tablet 40 mg (has no administration in time range)  polyethylene glycol (MIRALAX / GLYCOLAX) packet 17 g (has no administration in time range)  ondansetron (ZOFRAN) injection 4 mg (4 mg Intravenous Given 12/15/18 1039)  sodium chloride 0.9 % bolus 1,000 mL (0 mLs Intravenous Stopped 12/15/18 1251)  morphine 2 MG/ML injection 2 mg (2 mg Intravenous Given 12/15/18 1043)  methylPREDNISolone sodium succinate (SOLU-MEDROL) 125 mg/2 mL injection 80 mg (80 mg Intravenous Given 12/15/18 1043)     Initial Impression / Assessment and Plan / ED Course  I have reviewed the triage vital signs and the nursing notes.  Pertinent labs & imaging results that were available during my care of the patient were reviewed by me and  considered in my medical decision making (  see chart for details).  Clinical Course as of Dec 15 1534  Sun Dec 15, 2018  1206 Patient voices improvement in her pain.  Rates it 2/10.   [SJ]  1219 Spoke with Merry Proud, patient's son. Visiting from [place]. He states patient has had difficulty caring for herself and her husband over the past several weeks.  She typically lives alone with her husband.  However, her daughter has been helping her with ADLs recently. The patient and her family agree that their priority would be to stay at home, if possible.   [SJ]  8119 Spoke with Arnetha Massy, Neurosurgery NP. [facility] to [facility] for pain control Dr. Trenton Gammon will come see her and discuss options with her.  No MRI necessary at this time.   [SJ]  21 Spoke with Dr. Tawanna Solo, hospitalist. Agrees to admit the patient.   [SJ]  1423 Updated patient's son, Merry Proud, on plan for transfer and evaluation by neurosurgery.   [SJ]    Clinical Course User Index [SJ] Khristin Keleher C, PA-C       Patient presents for reevaluation of worsening back pain.  No focal neuro deficits on exam. Patient is nontoxic appearing, afebrile, not tachycardic, not tachypneic, not hypotensive, maintains excellent SPO2 on room air.  Patient's ketonuria and elevated hemoglobin suggest dehydration, consistent with patient's recent history of poor oral intake.  Other than ketonuria, UA without acute abnormality. CT thoracic spine shows acute, severe T7 compression fracture.  Patient admitted for pain management as well as evaluation by neurosurgery.  Findings and plan of care discussed with Adrian Prows, MD. Dr. Vanita Panda personally evaluated and examined this patient.   Final Clinical Impressions(s) / ED Diagnoses   Final diagnoses:  Compression fracture of T7 vertebra, initial encounter [facility])  Dehydration    ED Discharge Orders    None       Layla Maw 12/15/18 1538    Carmin Muskrat, MD 12/17/18 0330

## 2018-12-15 NOTE — ED Notes (Signed)
[facility] called for transport. 

## 2018-12-15 NOTE — ED Notes (Signed)
Urine specimen at bedside.

## 2018-12-15 NOTE — ED Notes (Signed)
[facility] arrived for transport 

## 2018-12-15 NOTE — H&P (Signed)
History and Physical    Jill Mcdowell:[id number] DOB: [birth year] DOA: 12/15/2018  PCP: Leighton Ruff, MD   Patient coming from: Home    Chief Complaint: Severe back pain  HPI: Jill Mcdowell is a 85 y.o. female with medical history significant of PVCs who presents to the emergency department today due to persistent severe back pain.  She reported that her husband fell in the shower and she tried to help him get up but ended up  with developing severe back pain.  She was seen by her PCP on the same day and was given muscle relaxants which did not help.  She again went back on Wednesday when they did x-rays but it showed lot of  fractures.  She was being planned to be seen by neurosurgery and was started on Vicodin.  Since then patient has been having constant back pain and is only getting worse.  She also complains of constipation.  Patient is otherwise healthy elderly female who is physically active.  She does not use any cane or walker for ambulation.  She has history of PVCs and is on Cardizem. Patient seen and examined the bedside in the emergency department.  Currently hemodynamically stable.  Complains of severe back pain. She denies any fever, chills, chest pain, shortness of breath, cough, abdominal pain, nausea, vomiting or diarrhea.  ED Course: CT spine showed severe acute compression fracture of T7 with paraspinal hematoma.  Neurosurgery consulted and recommended to be transferred to [facility] for possible kyphoplasty.  Started on pain management.  Review of Systems: As per HPI otherwise 10 point review of systems negative.    Past Medical History:  Diagnosis Date  . Arrhythmia 02/06/2012   hx of paroxysmal a fib with no recurrence,rare palpations  . Breast lump   . GERD (gastroesophageal reflux disease)   . Hypertension 2013   good control  . Kidney stones    history of  . Thyroid disease     Past Surgical History:  Procedure Laterality Date  . ABDOMINAL  HYSTERECTOMY    . NM MYOCAR PERF WALL MOTION  Feb 19 2008   negative for ischemia  . SUPRAVENTRICULAR TACHYCARDIA ABLATION       reports that she has quit smoking. She has never used smokeless tobacco. She reports that she does not drink alcohol or use drugs.  Allergies  Allergen Reactions  . Metoprolol Other (See Comments)    hallucinations  . Latex Other (See Comments)    Skin irritation  . Sulfa Antibiotics Hives    Family History  Problem Relation Age of Onset  . Cancer Mother   . Stroke Father   . Cancer Brother      Prior to Admission medications   Medication Sig Start Date End Date Taking? Authorizing Provider  acetaminophen (TYLENOL) 500 MG tablet Take 500 mg by mouth every 8 (eight) hours as needed for headache.    Yes [provider]  Brimonidine Tartrate (LUMIFY) 0.025 % SOLN Apply 1 drop to eye daily.   Yes [provider]  calcium carbonate (TUMS) 500 MG chewable tablet Chew 3 tablets by mouth daily as needed for indigestion or heartburn.    Yes [provider]  Cholecalciferol (VITAMIN D-3) 5000 UNITS TABS Take 10,000 Units by mouth every Friday.    Yes [provider]  DILT-XR 180 MG 24 hr capsule TAKE 1 CAPSULE EVERY DAY Patient taking differently: Take 180 mg by mouth daily.  10/26/18  Yes Croitoru, Mihai,  MD  HYDROcodone-acetaminophen (NORCO/VICODIN) 5-325 MG tablet Take 1 tablet by mouth every 6 (six) hours as needed for moderate pain.  12/11/18  Yes [provider]  iron polysaccharides (NU-IRON) 150 MG capsule Take 150 mg by mouth 2 (two) times a week.    Yes [provider]  levothyroxine (SYNTHROID, LEVOTHROID) 50 MCG tablet Take 50 mcg by mouth daily before breakfast.   Yes [provider]  Multiple Vitamins-Minerals (PRESERVISION AREDS PO) Take 1 capsule by mouth 2 (two) times daily.    Yes [provider]  omeprazole (PRILOSEC) 20 MG capsule Take 20 mg by mouth daily with lunch.    Yes  [provider]  polyethylene glycol powder (GLYCOLAX/MIRALAX) 17 GM/SCOOP powder Take 17 g by mouth daily.   Yes [provider]  tiZANidine (ZANAFLEX) 2 MG tablet Take 2 mg by mouth every 8 (eight) hours as needed for muscle spasms.  12/09/18  Yes [provider]    Physical Exam: Vitals:   12/15/18 1230 12/15/18 1300 12/15/18 1330 12/15/18 1400  BP: (!) 165/91 (!) 153/80 (!) 164/89 (!) 165/90  Pulse: 86 82 100 95  Resp: 16 16 20 17   Temp:      TempSrc:      SpO2: 97% 97% 99% 97%    Constitutional: Pleasant elderly female, thin built Vitals:   12/15/18 1230 12/15/18 1300 12/15/18 1330 12/15/18 1400  BP: (!) 165/91 (!) 153/80 (!) 164/89 (!) 165/90  Pulse: 86 82 100 95  Resp: 16 16 20 17   Temp:      TempSrc:      SpO2: 97% 97% 99% 97%   Eyes: PERRL, lids and conjunctivae normal ENMT: Mucous membranes are moist. Posterior pharynx clear of any exudate or lesions.Normal dentition.  Neck: normal, supple, no masses, no thyromegaly Respiratory: clear to auscultation bilaterally, no wheezing, no crackles. Normal respiratory effort. No accessory muscle use.  Cardiovascular: Regular rate and rhythm, no murmurs / rubs / gallops. No extremity edema. 2+ pedal pulses. No carotid bruits.  Abdomen: no tenderness, no masses palpated. No hepatosplenomegaly. Bowel sounds positive.  Musculoskeletal: no clubbing / cyanosis. No joint deformity upper and lower extremities. Good ROM, no contractures. Normal muscle tone.  Severe upper back tenderness Skin: no rashes, lesions, ulcers. No induration Neurologic: CN 2-12 grossly intact. Sensation intact, DTR normal. Strength 5/5 in all 4.  Psychiatric: Normal judgment and insight. Alert and oriented x 3. Normal mood.   Foley Catheter:None  Labs on Admission: I have personally reviewed following labs and imaging studies  CBC: Recent Labs  Lab 12/13/18 2347 12/15/18 1020  WBC 8.1 10.0  NEUTROABS 5.8 8.6*  HGB 13.5 15.1*   HCT 43.0 46.8*  MCV 94.1 92.9  PLT 176 426   Basic Metabolic Panel: Recent Labs  Lab 12/13/18 2347 12/15/18 1020  NA 140 137  K 3.9 3.8  CL 105 100  CO2 27 26  GLUCOSE 117* 127*  BUN 13 10  CREATININE 0.71 0.61  CALCIUM 8.8* 9.2   GFR: Estimated Creatinine Clearance: 38.9 mL/min (by C-G formula based on SCr of 0.61 mg/dL). Liver Function Tests: Recent Labs  Lab 12/15/18 1020  AST 22  ALT 19  ALKPHOS 75  BILITOT 1.5*  PROT 7.7  ALBUMIN 4.5   No results for input(s): LIPASE, AMYLASE in the last 168 hours. No results for input(s): AMMONIA in the last 168 hours. Coagulation Profile: No results for input(s): INR, PROTIME in the last 168 hours. Cardiac Enzymes: No results for  input(s): CKTOTAL, CKMB, CKMBINDEX, TROPONINI in the last 168 hours. BNP (last 3 results) No results for input(s): PROBNP in the last 8760 hours. HbA1C: No results for input(s): HGBA1C in the last 72 hours. CBG: No results for input(s): GLUCAP in the last 168 hours. Lipid Profile: No results for input(s): CHOL, HDL, LDLCALC, TRIG, CHOLHDL, LDLDIRECT in the last 72 hours. Thyroid Function Tests: No results for input(s): TSH, T4TOTAL, FREET4, T3FREE, THYROIDAB in the last 72 hours. Anemia Panel: No results for input(s): VITAMINB12, FOLATE, FERRITIN, TIBC, IRON, RETICCTPCT in the last 72 hours. Urine analysis:    Component Value Date/Time   COLORURINE YELLOW 12/15/2018 1020   APPEARANCEUR CLEAR 12/15/2018 1020   LABSPEC 1.017 12/15/2018 1020   PHURINE 5.0 12/15/2018 1020   GLUCOSEU NEGATIVE 12/15/2018 1020   HGBUR NEGATIVE 12/15/2018 1020   BILIRUBINUR NEGATIVE 12/15/2018 1020   KETONESUR 20 (A) 12/15/2018 1020   PROTEINUR NEGATIVE 12/15/2018 1020   UROBILINOGEN 0.2 02/04/2011 2130   NITRITE NEGATIVE 12/15/2018 1020   LEUKOCYTESUR NEGATIVE 12/15/2018 1020    Radiological Exams on Admission: Dg Chest 2 View  Result Date: 12/14/2018 CLINICAL DATA:  Shortness of breath. EXAM: CHEST - 2  VIEW COMPARISON:  PA and lateral chest 09/19/2015. FINDINGS: The lungs are clear. No pneumothorax or pleural effusion. Heart size is normal. Aortic atherosclerosis is noted. No acute bony abnormality. Remote T10, T12 and L1 compression fractures noted. IMPRESSION: No acute disease. Electronically Signed   By: Inge Rise M.D.   On: 12/14/2018 00:52   Ct Thoracic Spine Wo Contrast  Result Date: 12/15/2018 CLINICAL DATA:  Severe back pain since Monday. Injured while trying to help husband from falling. EXAM: CT THORACIC SPINE WITHOUT CONTRAST TECHNIQUE: Multidetector CT images of the thoracic were obtained using the standard protocol without intravenous contrast. COMPARISON:  MRI thoracic spine 09/19/2015 FINDINGS: Alignment: Exaggerated thoracic kyphosis but the vertebral bodies are normally aligned. Vertebrae: There is a remote T10 compression fracture, markedly progressive since 2017 with vertebral plana appearance. Mild retropulsion but no significant canal compromise. Acute appearing severe compression fracture of T7 with minimal retropulsion but no canal compromise. Compression is estimated at 60% and there is paraspinal hematoma. Compression fracture of L1 involving the superior endplate. This was not present on prior lumbar radiographs from 02/18/2018. I do not see any obvious paraspinal hematoma and I do not think this is an acute fracture. Paraspinal and other soft tissues: Paraspinal hematoma noted at T7. There are small bilateral pleural effusions and bibasilar atelectasis. Aortic calcifications without definite aneurysm. The visualized posterior ribs are intact. Disc levels: Minimal retropulsion at T7 mainly involving the inferior aspect of the vertebral body. There also appears to be a faintly calcified disc protrusion but the spinal canal is generous in is no significant spinal stenosis. Mild retropulsion at T10 with mild canal encroachment and mild spinal stenosis. Mild retropulsion of the  posterosuperior aspect of L1 with mild canal encroachment but no significant stenosis. No spinal canal hematoma IMPRESSION: 1. Acute appearing and severe compression fracture of T7 with paraspinal hematoma. No significant retropulsion or canal compromise. 2. Remote but progressive T10 compression fracture. No significant change since lumbar radiographs from 2019. Mild retropulsion and mild canal compromise. 3. Indeterminate age L1 superior endplate fracture with mild retropulsion. 4. MRI may be helpful for further evaluation. Electronically Signed   By: Marijo Sanes M.D.   On: 12/15/2018 11:33     Assessment/Plan Principal Problem:   Back pain Active Problems:   Paroxysmal atrial fibrillation (  HCC)   PVC's (premature ventricular contractions)   Compression fracture of body of thoracic vertebra (HCC)    Back pain/T7 compression fracture: CT finding as above.  Also showed remote progressive T10 compression fracture. Neurosurgery consulted.  Being transferred to [facility] for further evaluation.  She may need kyphoplasty. Continue pain management, muscle relaxants. PT/OT evaluation.  Paroxysmal A. fib: As per the report , this was just a transient incident.  Not started on anticoagulation.  Currently in normal sinus rhythm. Follows with cardiology.  PVCs: On Cardizem  Constipation: Continue MiraLAX  Hypothyroidism: Continue Synthyroid   Severity of Illness: The appropriate patient status for this patient is INPATIENT. Patient has severe back pain.  Needs IV pain medications.  She also need extensive PT/OT involvement.  She is also being planned for kyphoplasty by neurosurgery.    DVT prophylaxis: SCD Code Status: Full code Family Communication: None present at the bedside Consults called: Neurosurgery called by  by ED physician     Shelly Coss MD [facility] Pager [phone]  If 7PM-7AM, please contact night-coverage [email or web address] Password [facility]  12/15/2018, 2:39 PM

## 2018-12-15 NOTE — ED Notes (Signed)
ED Provider at bedside. 

## 2018-12-15 NOTE — Progress Notes (Signed)
Consult request has been received. CSW attempting to follow up at present time  Myka Lukins M. Laquinn Shippy LCSWA Transitions of Care  Clinical Social Worker  Ph: [phone] 

## 2018-12-15 NOTE — ED Notes (Signed)
ED TO INPATIENT HANDOFF REPORT  ED Nurse Name and Phone #: Earnest Bailey [phone]  S Name/Age/Gender Jill Mcdowell 85 y.o. female Room/Bed: WA17/WA17  Code Status   Code Status: Full Code  Home/SNF/Other Home Patient oriented to: self, place, time and situation Is this baseline? Yes   Triage Complete: Triage complete  Chief Complaint back pain, weak, no appetite  Triage Note Pt reports was here Friday for pain in her back after a fall earlier in week and having compression fractures. Pt reports that when she is having the pains she gets SOB. C/o bilat foot swelling "that has been on going, and having to get up and go to restroom more (urination)". Reports had UA here Friday night. Son Merry Proud drove from [place] and brought her today and feels she needs to be admitted due to pain and not eating much.    Allergies Allergies  Allergen Reactions  . Metoprolol Other (See Comments)    hallucinations  . Latex Other (See Comments)    Skin irritation  . Sulfa Antibiotics Hives    Level of Care/Admitting Diagnosis ED Disposition    ED Disposition Condition [facility] Area: [place] [[id number]]  Level of Care: Med-Surg [16]  Covid Evaluation: Screening Protocol (No Symptoms)  Diagnosis: Compression fracture of body of thoracic vertebra [facility]) [[id number]]  Admitting Physician: Shelly Coss [[id number]]  Attending Physician: Shelly Coss [[id number]]  Estimated length of stay: past midnight tomorrow  Certification:: I certify this patient will need inpatient services for at least 2 midnights  PT Class (Do Not Modify): Inpatient [101]  PT Acc Code (Do Not Modify): Private [1]       B Medical/Surgery History Past Medical History:  Diagnosis Date  . Arrhythmia 02/06/2012   hx of paroxysmal a fib with no recurrence,rare palpations  . Breast lump   . GERD (gastroesophageal reflux disease)   . Hypertension 2013   good control  . Kidney stones    history  of  . Thyroid disease    Past Surgical History:  Procedure Laterality Date  . ABDOMINAL HYSTERECTOMY    . NM MYOCAR PERF WALL MOTION  Feb 19 2008   negative for ischemia  . SUPRAVENTRICULAR TACHYCARDIA ABLATION       A IV Location/Drains/Wounds Patient Lines/Drains/Airways Status   Active Line/Drains/Airways    Name:   Placement date:   Placement time:   Site:   Days:   Peripheral IV 12/15/18 Right Forearm   12/15/18    1039    Forearm   less than 1          Intake/Output Last 24 hours  Intake/Output Summary (Last 24 hours) at 12/15/2018 2015 Last data filed at 12/15/2018 1251 Gross per 24 hour  Intake 1000 ml  Output -  Net 1000 ml    Labs/Imaging Results for orders placed or performed during the hospital encounter of 12/15/18 (from the past 48 hour(s))  Urinalysis, Routine w reflex microscopic     Status: Abnormal   Collection Time: 12/15/18 10:20 AM  Result Value Ref Range   Color, Urine YELLOW YELLOW   APPearance CLEAR CLEAR   Specific Gravity, Urine 1.017 1.005 - 1.030   pH 5.0 5.0 - 8.0   Glucose, UA NEGATIVE NEGATIVE mg/dL   Hgb urine dipstick NEGATIVE NEGATIVE   Bilirubin Urine NEGATIVE NEGATIVE   Ketones, ur 20 (A) NEGATIVE mg/dL   Protein, ur NEGATIVE NEGATIVE mg/dL   Nitrite NEGATIVE NEGATIVE   Leukocytes,Ua  NEGATIVE NEGATIVE    Comment: Performed at [facility] [address] [place], [place] [address]  Comprehensive metabolic panel     Status: Abnormal   Collection Time: 12/15/18 10:20 AM  Result Value Ref Range   Sodium 137 135 - 145 mmol/L   Potassium 3.8 3.5 - 5.1 mmol/L   Chloride 100 98 - 111 mmol/L   CO2 26 22 - 32 mmol/L   Glucose, Bld 127 (H) 70 - 99 mg/dL   BUN 10 8 - 23 mg/dL   Creatinine, Ser 0.61 0.44 - 1.00 mg/dL   Calcium 9.2 8.9 - 10.3 mg/dL   Total Protein 7.7 6.5 - 8.1 g/dL   Albumin 4.5 3.5 - 5.0 g/dL   AST 22 15 - 41 U/L   ALT 19 0 - 44 U/L   Alkaline Phosphatase 75 38 - 126 U/L   Total Bilirubin 1.5  (H) 0.3 - 1.2 mg/dL   GFR calc non Af Amer >60 >60 mL/min   GFR calc Af Amer >60 >60 mL/min   Anion gap 11 5 - 15    Comment: Performed at [facility], [place] [address] [place], [place] [address]  CBC with Differential     Status: Abnormal   Collection Time: 12/15/18 10:20 AM  Result Value Ref Range   WBC 10.0 4.0 - 10.5 K/uL   RBC 5.04 3.87 - 5.11 MIL/uL   Hemoglobin 15.1 (H) 12.0 - 15.0 g/dL   HCT 46.8 (H) 36.0 - 46.0 %   MCV 92.9 80.0 - 100.0 fL   MCH 30.0 26.0 - 34.0 pg   MCHC 32.3 30.0 - 36.0 g/dL   RDW 12.9 11.5 - 15.5 %   Platelets 201 150 - 400 K/uL   nRBC 0.0 0.0 - 0.2 %   Neutrophils Relative % 87 %   Neutro Abs 8.6 (H) 1.7 - 7.7 K/uL   Lymphocytes Relative 7 %   Lymphs Abs 0.7 0.7 - 4.0 K/uL   Monocytes Relative 6 %   Monocytes Absolute 0.6 0.1 - 1.0 K/uL   Eosinophils Relative 0 %   Eosinophils Absolute 0.0 0.0 - 0.5 K/uL   Basophils Relative 0 %   Basophils Absolute 0.0 0.0 - 0.1 K/uL   Immature Granulocytes 0 %   Abs Immature Granulocytes 0.03 0.00 - 0.07 K/uL    Comment: Performed at [facility], [place] [address] [place], [place] [address]  SARS Coronavirus 2 (CEPHEID - Performed in [place] hospital lab), Hosp Order     Status: None   Collection Time: 12/15/18  1:33 PM   Specimen: Nasopharyngeal Swab  Result Value Ref Range   SARS Coronavirus 2 NEGATIVE NEGATIVE    Comment: (NOTE) If result is NEGATIVE SARS-CoV-2 target nucleic acids are NOT DETECTED. The SARS-CoV-2 RNA is generally detectable in upper and lower  respiratory specimens during the acute phase of infection. The lowest  concentration of SARS-CoV-2 viral copies this assay can detect is 250  copies / mL. A negative result does not preclude SARS-CoV-2 infection  and should not be used as the sole basis for treatment or other  patient management decisions.  A negative result may occur with  improper specimen collection / handling, submission of specimen  other  than nasopharyngeal swab, presence of viral mutation(s) within the  areas targeted by this assay, and inadequate number of viral copies  (<250 copies / mL). A negative result must be combined with clinical  observations, patient history,  and epidemiological information. If result is POSITIVE SARS-CoV-2 target nucleic acids are DETECTED. The SARS-CoV-2 RNA is generally detectable in upper and lower  respiratory specimens dur ing the acute phase of infection.  Positive  results are indicative of active infection with SARS-CoV-2.  Clinical  correlation with patient history and other diagnostic information is  necessary to determine patient infection status.  Positive results do  not rule out bacterial infection or co-infection with other viruses. If result is PRESUMPTIVE POSTIVE SARS-CoV-2 nucleic acids MAY BE PRESENT.   A presumptive positive result was obtained on the submitted specimen  and confirmed on repeat testing.  While 2019 novel coronavirus  (SARS-CoV-2) nucleic acids may be present in the submitted sample  additional confirmatory testing may be necessary for epidemiological  and / or clinical management purposes  to differentiate between  SARS-CoV-2 and other Sarbecovirus currently known to infect humans.  If clinically indicated additional testing with an alternate test  methodology [phone]) is advised. The SARS-CoV-2 RNA is generally  detectable in upper and lower respiratory sp ecimens during the acute  phase of infection. The expected result is Negative. Fact Sheet for Patients:  StrictlyIdeas.no Fact Sheet for Healthcare Providers: BankingDealers.co.za This test is not yet approved or cleared by the [place] FDA and has been authorized for detection and/or diagnosis of SARS-CoV-2 by FDA under an Emergency Use Authorization (EUA).  This EUA will remain in effect (meaning this test can be used) for the duration  of the COVID-19 declaration under Section 564(b)(1) of the Act, 21 U.S.C. section 360bbb-3(b)(1), unless the authorization is terminated or revoked sooner. Performed at [facility], [place] [address] [place], [place] [address]    Dg Chest 2 View  Result Date: 12/14/2018 CLINICAL DATA:  Shortness of breath. EXAM: CHEST - 2 VIEW COMPARISON:  PA and lateral chest 09/19/2015. FINDINGS: The lungs are clear. No pneumothorax or pleural effusion. Heart size is normal. Aortic atherosclerosis is noted. No acute bony abnormality. Remote T10, T12 and L1 compression fractures noted. IMPRESSION: No acute disease. Electronically Signed   By: Inge Rise M.D.   On: 12/14/2018 00:52   Ct Thoracic Spine Wo Contrast  Result Date: 12/15/2018 CLINICAL DATA:  Severe back pain since Monday. Injured while trying to help husband from falling. EXAM: CT THORACIC SPINE WITHOUT CONTRAST TECHNIQUE: Multidetector CT images of the thoracic were obtained using the standard protocol without intravenous contrast. COMPARISON:  MRI thoracic spine 09/19/2015 FINDINGS: Alignment: Exaggerated thoracic kyphosis but the vertebral bodies are normally aligned. Vertebrae: There is a remote T10 compression fracture, markedly progressive since 2017 with vertebral plana appearance. Mild retropulsion but no significant canal compromise. Acute appearing severe compression fracture of T7 with minimal retropulsion but no canal compromise. Compression is estimated at 60% and there is paraspinal hematoma. Compression fracture of L1 involving the superior endplate. This was not present on prior lumbar radiographs from 02/18/2018. I do not see any obvious paraspinal hematoma and I do not think this is an acute fracture. Paraspinal and other soft tissues: Paraspinal hematoma noted at T7. There are small bilateral pleural effusions and bibasilar atelectasis. Aortic calcifications without definite aneurysm. The visualized posterior ribs  are intact. Disc levels: Minimal retropulsion at T7 mainly involving the inferior aspect of the vertebral body. There also appears to be a faintly calcified disc protrusion but the spinal canal is generous in is no significant spinal stenosis. Mild retropulsion at T10 with mild canal encroachment and mild spinal stenosis. Mild retropulsion of the  posterosuperior aspect of L1 with mild canal encroachment but no significant stenosis. No spinal canal hematoma IMPRESSION: 1. Acute appearing and severe compression fracture of T7 with paraspinal hematoma. No significant retropulsion or canal compromise. 2. Remote but progressive T10 compression fracture. No significant change since lumbar radiographs from 2019. Mild retropulsion and mild canal compromise. 3. Indeterminate age L1 superior endplate fracture with mild retropulsion. 4. MRI may be helpful for further evaluation. Electronically Signed   By: Marijo Sanes M.D.   On: 12/15/2018 11:33    Pending Labs Unresulted Labs (From admission, onward)    Start     Ordered   12/16/18 5997  Basic metabolic panel  Tomorrow morning,   R     12/15/18 1438   12/16/18 0500  CBC with Differential/Platelet  Tomorrow morning,   R     12/15/18 1439   12/15/18 1020  Urine culture  ONCE - STAT,   STAT     12/15/18 1021          Vitals/Pain Today's Vitals   12/15/18 1900 12/15/18 1930 12/15/18 1947 12/15/18 2000  BP: (!) 154/83 (!) 155/85  (!) 156/85  Pulse: 89 91  (!) 101  Resp: 15 17  15   Temp:      TempSrc:      SpO2: 92% 91%  93%  PainSc:   3      Isolation Precautions No active isolations  Medications Medications  morphine 2 MG/ML injection 2 mg (has no administration in time range)  HYDROcodone-acetaminophen (NORCO) 7.5-325 MG per tablet 1 tablet (has no administration in time range)  ondansetron (ZOFRAN) injection 4 mg (has no administration in time range)  methocarbamol (ROBAXIN) 500 mg in dextrose 5 % 50 mL IVPB (has no administration in time  range)  diltiazem (DILACOR XR) 24 hr capsule 180 mg (180 mg Oral Given 12/15/18 1727)  levothyroxine (SYNTHROID) tablet 50 mcg (has no administration in time range)  pantoprazole (PROTONIX) EC tablet 40 mg (40 mg Oral Given 12/15/18 1727)  polyethylene glycol (MIRALAX / GLYCOLAX) packet 17 g (17 g Oral Given 12/15/18 1726)  ondansetron (ZOFRAN) injection 4 mg (4 mg Intravenous Given 12/15/18 1039)  sodium chloride 0.9 % bolus 1,000 mL (0 mLs Intravenous Stopped 12/15/18 1251)  morphine 2 MG/ML injection 2 mg (2 mg Intravenous Given 12/15/18 1043)  methylPREDNISolone sodium succinate (SOLU-MEDROL) 125 mg/2 mL injection 80 mg (80 mg Intravenous Given 12/15/18 1043)    Mobility walks Moderate fall risk   Focused Assessments Back pain   R Recommendations: See Admitting Provider Note  Report given to:   Additional Notes:

## 2018-12-15 NOTE — ED Notes (Signed)
Patient transported to CT 

## 2018-12-15 NOTE — ED Notes (Signed)
Patient has ambulated to restroom without assistance or complication.

## 2018-12-15 NOTE — TOC Initial Note (Signed)
Transition of Care [facility]) - Initial/Assessment Note    Patient Details  Name: Jill Mcdowell MRN: [id number] Date of Birth: 01/22/1927  Transition of Care [facility] CM/SW Contact:    Clotee Schlicker Dimitri Ped, LCSW Phone Number: 12/15/2018, 4:51 PM  Clinical Narrative:      CSW in contact with pt's family to recognize consult requesting home health upon discharge.  Family reports that pt lives at home with her spouse who she serves at the primary caregiver for. Family goes into detail and explains that the pt's husband suffers from chronic illness and that pt provides all care for her husband.   Pt recently suffered from back injury while caring for her spouse and family is interested in receiving home helath from [facility].    Family reports that pt was in "excellent health" prior to her injury. Family goes into detail and explains that pt transported herself to and from medical appointments and was able to perform all ADLs independently without the use of DME.   Family states that their goal is for pt to discharge home with home health and that they would begin to look into private pay nurse aid services from [facility].   TOC team will continue to follow pt for discharge needs.   [place]  Clinical Social Worker  Ph: [phone]      Expected Discharge Plan: [place] Barriers to Discharge: No Barriers Identified   Patient Goals and CMS Choice Patient states their goals for this hospitalization and ongoing recovery are:: to return home with home health CMS Medicare.gov Compare Post Acute Care list provided to:: Other (Comment Required)(Kristy Strader and Dallas Breeding) Choice offered to / list presented to : Adult Children  Expected Discharge Plan and Services Expected Discharge Plan: [place] In-house Referral: Clinical Social Work     Living arrangements for the past 2 months: [place]: [facility]        Prior Living Arrangements/Services Living arrangements for the past 2 months: [place] Lives with:: Spouse Patient language and need for interpreter reviewed:: Yes Do you feel safe going back to the place where you live?: Yes      Need for Family Participation in Patient Care: Yes (Comment) Care giver support system in place?: Yes (comment)(Family looking into private pay care giver services.)   Criminal Activity/Legal Involvement Pertinent to Current Situation/Hospitalization: No - Comment as needed  Activities of Daily Living      Permission Sought/Granted Permission sought to share information with : Case Manager, Family Supports    Share Information with NAME: Dallas Breeding and [place] granted to share info w AGENCY: [facility]  Permission granted to share info w Relationship: Grandaughter and son  Permission granted to share info w Contact Information: ph: [phone] and [phone]  Emotional Assessment   Attitude/Demeanor/Rapport: Unable to Assess Affect (typically observed): Unable to Assess Orientation: : Oriented to Self, Oriented to Place, Oriented to  Time, Oriented to Situation   Psych Involvement: No (comment)  Admission diagnosis:  back pain, weak, no appetite Patient Active Problem List   Diagnosis Date Noted  . Compression fracture of body of thoracic vertebra ([place]) 12/15/2018  . Back pain 12/15/2018  . PVC's (premature ventricular contractions) 11/05/2017  .  Palpitations 11/17/2013  . Syncope, near 11/17/2013  . Paroxysmal atrial fibrillation ([place]) 02/26/2013  . Breast cyst 10/25/2012  . Breast mass, right 10/21/2012   PCP:  Leighton Ruff, MD Pharmacy:   [facility] [place], [place] [address] Phone: [phone] Fax: [phone]  [id number] [address] [place] [address] Phone: [phone] Fax: [phone]     Social Determinants of Health (SDOH) Interventions    Readmission Risk Interventions No flowsheet data found.

## 2018-12-15 NOTE — ED Notes (Signed)
Per triage RN, patient has back brace however refuses to wear back brace.

## 2018-12-15 NOTE — ED Triage Notes (Signed)
Pt reports was here Friday for pain in her back after a fall earlier in week and having compression fractures. Pt reports that when she is having the pains she gets SOB. C/o bilat foot swelling "that has been on going, and having to get up and go to restroom more (urination)". Reports had UA here Friday night. Son Merry Proud drove from [place] and brought her today and feels she needs to be admitted due to pain and not eating much.

## 2018-12-15 NOTE — ED Notes (Signed)
Attempted to call report, unable to at this time. Phone number given to Network engineer.

## 2018-12-16 ENCOUNTER — Inpatient Hospital Stay (HOSPITAL_COMMUNITY): Payer: Medicare HMO

## 2018-12-16 LAB — URINE CULTURE: Culture: NO GROWTH

## 2018-12-16 MED ORDER — DIAZEPAM 5 MG PO TABS
5.0000 mg | ORAL_TABLET | Freq: Once | ORAL | Status: AC | PRN
  Administered 2018-12-16: 5 mg via ORAL
  Filled 2018-12-16: qty 1

## 2018-12-16 MED ORDER — HYDROMORPHONE HCL 1 MG/ML IJ SOLN: 1.0000 mg | Freq: Once | INTRAMUSCULAR | Status: DC | PRN

## 2018-12-16 MED ORDER — SODIUM CHLORIDE 0.9 % IV SOLN
INTRAVENOUS | Status: DC | PRN
  Administered 2018-12-16: 250 mL via INTRAVENOUS

## 2018-12-16 MED ORDER — HYDRALAZINE HCL 20 MG/ML IJ SOLN: 10.0000 mg | Freq: Four times a day (QID) | INTRAMUSCULAR | Status: DC | PRN

## 2018-12-16 NOTE — Progress Notes (Signed)
PROGRESS NOTE    Jill Mcdowell  OBS:[id number] DOB: [birth year] DOA: 12/15/2018 PCP: Leighton Ruff, MD   Brief Narrative:  Jill Mcdowell is a 85 y.o. female with medical history significant of PVCs who presents to the emergency department on 12/15/2018 due to persistent severe back pain and was eventually diagnosed with T7 compression fracture and was transferred to [facility] for neurosurgery evaluation and possible kyphoplasty.  Consultants:   Neurosurgery  Procedures:   None  Antimicrobials:   None   Subjective: Patient seen and examined.  She states that she still has midthoracic back pain.  No worse or better than yesterday.  No new complaint.  Objective: Vitals:   12/15/18 2130 12/15/18 2224 12/15/18 2257 12/16/18 0607  BP: 131/78 (!) 150/70  132/62  Pulse: (!) 107 96  77  Resp: 14 16  18   Temp:  97.9 F (36.6 C)  98.3 F (36.8 C)  TempSrc:  Oral  Oral  SpO2: 92% 92%  93%  Weight:   53.8 kg   Height:   5\' 1"  (1.549 m)     Intake/Output Summary (Last 24 hours) at 12/16/2018 0801 Last data filed at 12/15/2018 1251 Gross per 24 hour  Intake 1000 ml  Output -  Net 1000 ml   Filed Weights   12/15/18 2257  Weight: 53.8 kg    Examination:  General exam: Appears calm and comfortable  Respiratory system: Clear to auscultation. Respiratory effort normal. Cardiovascular system: S1 & S2 heard, RRR. No JVD, murmurs, rubs, gallops or clicks. No pedal edema. Gastrointestinal system: Abdomen is nondistended, soft and nontender. No organomegaly or masses felt. Normal bowel sounds heard. Central nervous system: Alert and oriented. No focal neurological deficits. Extremities: Symmetric 5 x 5 power. Skin: No rashes, lesions or ulcers Psychiatry: Judgement and insight appear normal. Mood & affect appropriate.    Data Reviewed: I have personally reviewed following labs and imaging studies  CBC: Recent Labs  Lab 12/13/18 2347 12/15/18 1020  WBC 8.1  10.0  NEUTROABS 5.8 8.6*  HGB 13.5 15.1*  HCT 43.0 46.8*  MCV 94.1 92.9  PLT 176 476   Basic Metabolic Panel: Recent Labs  Lab 12/13/18 2347 12/15/18 1020  NA 140 137  K 3.9 3.8  CL 105 100  CO2 27 26  GLUCOSE 117* 127*  BUN 13 10  CREATININE 0.71 0.61  CALCIUM 8.8* 9.2   GFR: Estimated Creatinine Clearance: 36 mL/min (by C-G formula based on SCr of 0.61 mg/dL). Liver Function Tests: Recent Labs  Lab 12/15/18 1020  AST 22  ALT 19  ALKPHOS 75  BILITOT 1.5*  PROT 7.7  ALBUMIN 4.5   No results for input(s): LIPASE, AMYLASE in the last 168 hours. No results for input(s): AMMONIA in the last 168 hours. Coagulation Profile: No results for input(s): INR, PROTIME in the last 168 hours. Cardiac Enzymes: No results for input(s): CKTOTAL, CKMB, CKMBINDEX, TROPONINI in the last 168 hours. BNP (last 3 results) No results for input(s): PROBNP in the last 8760 hours. HbA1C: No results for input(s): HGBA1C in the last 72 hours. CBG: No results for input(s): GLUCAP in the last 168 hours. Lipid Profile: No results for input(s): CHOL, HDL, LDLCALC, TRIG, CHOLHDL, LDLDIRECT in the last 72 hours. Thyroid Function Tests: No results for input(s): TSH, T4TOTAL, FREET4, T3FREE, THYROIDAB in the last 72 hours. Anemia Panel: No results for input(s): VITAMINB12, FOLATE, FERRITIN, TIBC, IRON, RETICCTPCT in the last 72 hours. Sepsis Labs: No results for input(s):  PROCALCITON, LATICACIDVEN in the last 168 hours.  Recent Results (from the past 240 hour(s))  Urine culture     Status: Abnormal   Collection Time: 12/13/18 11:47 PM   Specimen: Urine, Random  Result Value Ref Range Status   Specimen Description   Final    URINE, RANDOM Performed at [facility] [address], [place], [place] [address]    Special Requests   Final    NONE Performed at [facility], [place] [address], [place], [place] [address]    Culture (A)  Final    <10,000  COLONIES/mL INSIGNIFICANT GROWTH Performed at [facility] [address], [place], [place] [address]    Report Status 12/15/2018 FINAL  Final  SARS Coronavirus 2 (CEPHEID - Performed in [place] hospital lab), Hosp Order     Status: None   Collection Time: 12/15/18  1:33 PM   Specimen: Nasopharyngeal Swab  Result Value Ref Range Status   SARS Coronavirus 2 NEGATIVE NEGATIVE Final    Comment: (NOTE) If result is NEGATIVE SARS-CoV-2 target nucleic acids are NOT DETECTED. The SARS-CoV-2 RNA is generally detectable in upper and lower  respiratory specimens during the acute phase of infection. The lowest  concentration of SARS-CoV-2 viral copies this assay can detect is 250  copies / mL. A negative result does not preclude SARS-CoV-2 infection  and should not be used as the sole basis for treatment or other  patient management decisions.  A negative result may occur with  improper specimen collection / handling, submission of specimen other  than nasopharyngeal swab, presence of viral mutation(s) within the  areas targeted by this assay, and inadequate number of viral copies  (<250 copies / mL). A negative result must be combined with clinical  observations, patient history, and epidemiological information. If result is POSITIVE SARS-CoV-2 target nucleic acids are DETECTED. The SARS-CoV-2 RNA is generally detectable in upper and lower  respiratory specimens dur ing the acute phase of infection.  Positive  results are indicative of active infection with SARS-CoV-2.  Clinical  correlation with patient history and other diagnostic information is  necessary to determine patient infection status.  Positive results do  not rule out bacterial infection or co-infection with other viruses. If result is PRESUMPTIVE POSTIVE SARS-CoV-2 nucleic acids MAY BE PRESENT.   A presumptive positive result was obtained on the submitted specimen  and confirmed on repeat testing.  While 2019 novel  coronavirus  (SARS-CoV-2) nucleic acids may be present in the submitted sample  additional confirmatory testing may be necessary for epidemiological  and / or clinical management purposes  to differentiate between  SARS-CoV-2 and other Sarbecovirus currently known to infect humans.  If clinically indicated additional testing with an alternate test  methodology [id number]) is advised. The SARS-CoV-2 RNA is generally  detectable in upper and lower respiratory sp ecimens during the acute  phase of infection. The expected result is Negative. Fact Sheet for Patients:  StrictlyIdeas.no Fact Sheet for Healthcare Providers: BankingDealers.co.za This test is not yet approved or cleared by the [place] FDA and has been authorized for detection and/or diagnosis of SARS-CoV-2 by FDA under an Emergency Use Authorization (EUA).  This EUA will remain in effect (meaning this test can be used) for the duration of the COVID-19 declaration under Section 564(b)(1) of the Act, 21 U.S.C. section 360bbb-3(b)(1), unless the authorization is terminated or revoked sooner. Performed at [facility], [place] [address] [place], [place] [address]  Radiology Studies: Ct Thoracic Spine Wo Contrast  Result Date: 12/15/2018 CLINICAL DATA:  Severe back pain since Monday. Injured while trying to help husband from falling. EXAM: CT THORACIC SPINE WITHOUT CONTRAST TECHNIQUE: Multidetector CT images of the thoracic were obtained using the standard protocol without intravenous contrast. COMPARISON:  MRI thoracic spine 09/19/2015 FINDINGS: Alignment: Exaggerated thoracic kyphosis but the vertebral bodies are normally aligned. Vertebrae: There is a remote T10 compression fracture, markedly progressive since 2017 with vertebral plana appearance. Mild retropulsion but no significant canal compromise. Acute appearing severe compression fracture of T7 with  minimal retropulsion but no canal compromise. Compression is estimated at 60% and there is paraspinal hematoma. Compression fracture of L1 involving the superior endplate. This was not present on prior lumbar radiographs from 02/18/2018. I do not see any obvious paraspinal hematoma and I do not think this is an acute fracture. Paraspinal and other soft tissues: Paraspinal hematoma noted at T7. There are small bilateral pleural effusions and bibasilar atelectasis. Aortic calcifications without definite aneurysm. The visualized posterior ribs are intact. Disc levels: Minimal retropulsion at T7 mainly involving the inferior aspect of the vertebral body. There also appears to be a faintly calcified disc protrusion but the spinal canal is generous in is no significant spinal stenosis. Mild retropulsion at T10 with mild canal encroachment and mild spinal stenosis. Mild retropulsion of the posterosuperior aspect of L1 with mild canal encroachment but no significant stenosis. No spinal canal hematoma IMPRESSION: 1. Acute appearing and severe compression fracture of T7 with paraspinal hematoma. No significant retropulsion or canal compromise. 2. Remote but progressive T10 compression fracture. No significant change since lumbar radiographs from 2019. Mild retropulsion and mild canal compromise. 3. Indeterminate age L1 superior endplate fracture with mild retropulsion. 4. MRI may be helpful for further evaluation. Electronically Signed   By: Marijo Sanes M.D.   On: 12/15/2018 11:33    Scheduled Meds: . diltiazem  180 mg Oral Daily  . levothyroxine  50 mcg Oral QAC breakfast  . pantoprazole  40 mg Oral Daily  . polyethylene glycol  17 g Oral Daily   Continuous Infusions: . sodium chloride 250 mL (12/16/18 0352)  . methocarbamol (ROBAXIN) IV 500 mg (12/16/18 0353)     LOS: 1 day   Assessment & Plan:   Principal Problem:   Back pain Active Problems:   Paroxysmal atrial fibrillation (HCC)   PVC's (premature  ventricular contractions)   Compression fracture of body of thoracic vertebra (HCC)  Back pain/T7 compression fracture: To be evaluated by neurosurgery for possible kyphoplasty.  Continue pain medications.  History of brief episode of atrial fibrillation in the past: To patient, this happened only once and she is not on any anticoagulation.  She is in sinus rhythm.  History of PVCs: On Cardizem.  Hypothyroidism: Continue Synthroid.  DVT prophylaxis: SCD Code Status: Full code Family Communication: None present at bedside.  Discussed with patient.  She is competent and alert and oriented. Disposition Plan: To be determined based on clinical course and recommendations by neurosurgery.   Time spent: 28 minutes   Darliss Cheney, MD [facility] Pager [phone]  If 7PM-7AM, please contact night-coverage [email or web address] Password [email or web address] 12/16/2018, 8:01 AM

## 2018-12-16 NOTE — Consult Note (Signed)
Providing Compassionate, Quality Care - Together   Reason for Consult:Thoracic compression fracture Referring Physician: Arlean Hopping, PA  Jill Mcdowell is an 85 y.o. female.  HPI: Patient with a PMH significant for HTN, GERD, palpitations, and compression fractures. She states on December 09, 2018, she attempted to help her husband who had fallen in the shower and held him upright until her son arrived.  Her back pain began gradually and has continued to worsen since.  The pain runs across her thoracic back in a bandlike distribution.  It is equal bilaterally.  It is worse with movement, lying supine, or deep breathing.  She states that it is burning in nature. She was seen by Dr. Arnoldo Morale of neurosurgery in 2017 for a T10 compression fracture.  They managed her pain conservatively at that time and she did not move forward with a kyphoplasty. She reports that she takes calcium and vitamin D for "soft bones." She has had a CT scan so far for work-up this hospitalization.  Neurosurgery was consulted for multiple thoracic compression fractures. Patient expresses interest in kyphoplasty at this time.  Past Medical History:  Diagnosis Date  . Arrhythmia 02/06/2012   hx of paroxysmal a fib with no recurrence,rare palpations  . Breast lump   . GERD (gastroesophageal reflux disease)   . Hypertension 2013   good control  . Kidney stones    history of  . Thyroid disease     Past Surgical History:  Procedure Laterality Date  . ABDOMINAL HYSTERECTOMY    . NM MYOCAR PERF WALL MOTION  Feb 19 2008   negative for ischemia  . SUPRAVENTRICULAR TACHYCARDIA ABLATION      Family History  Problem Relation Age of Onset  . Cancer Mother   . Stroke Father   . Cancer Brother     Social History:  reports that she has quit smoking. She has never used smokeless tobacco. She reports that she does not drink alcohol or use drugs.  Allergies:  Allergies  Allergen Reactions  . Metoprolol Other (See Comments)     hallucinations  . Latex Other (See Comments)    Skin irritation  . Sulfa Antibiotics Hives    Medications: I have reviewed the patient's current medications.  Results for orders placed or performed during the hospital encounter of 12/15/18 (from the past 48 hour(s))  Urinalysis, Routine w reflex microscopic     Status: Abnormal   Collection Time: 12/15/18 10:20 AM  Result Value Ref Range   Color, Urine YELLOW YELLOW   APPearance CLEAR CLEAR   Specific Gravity, Urine 1.017 1.005 - 1.030   pH 5.0 5.0 - 8.0   Glucose, UA NEGATIVE NEGATIVE mg/dL   Hgb urine dipstick NEGATIVE NEGATIVE   Bilirubin Urine NEGATIVE NEGATIVE   Ketones, ur 20 (A) NEGATIVE mg/dL   Protein, ur NEGATIVE NEGATIVE mg/dL   Nitrite NEGATIVE NEGATIVE   Leukocytes,Ua NEGATIVE NEGATIVE    Comment: Performed at [facility], [place] [address], [place],  [address]  Comprehensive metabolic panel     Status: Abnormal   Collection Time: 12/15/18 10:20 AM  Result Value Ref Range   Sodium 137 135 - 145 mmol/L   Potassium 3.8 3.5 - 5.1 mmol/L   Chloride 100 98 - 111 mmol/L   CO2 26 22 - 32 mmol/L   Glucose, Bld 127 (H) 70 - 99 mg/dL   BUN 10 8 - 23 mg/dL   Creatinine, Ser 0.61 0.44 - 1.00 mg/dL  Calcium 9.2 8.9 - 10.3 mg/dL   Total Protein 7.7 6.5 - 8.1 g/dL   Albumin 4.5 3.5 - 5.0 g/dL   AST 22 15 - 41 U/L   ALT 19 0 - 44 U/L   Alkaline Phosphatase 75 38 - 126 U/L   Total Bilirubin 1.5 (H) 0.3 - 1.2 mg/dL   GFR calc non Af Amer >60 >60 mL/min   GFR calc Af Amer >60 >60 mL/min   Anion gap 11 5 - 15    Comment: Performed at [facility], [place] [address], [place], [place] [address]  CBC with Differential     Status: Abnormal   Collection Time: 12/15/18 10:20 AM  Result Value Ref Range   WBC 10.0 4.0 - 10.5 K/uL   RBC 5.04 3.87 - 5.11 MIL/uL   Hemoglobin 15.1 (H) 12.0 - 15.0 g/dL   HCT 46.8 (H) 36.0 - 46.0 %   MCV 92.9 80.0 - 100.0 fL   MCH 30.0 26.0 - 34.0 pg   MCHC  32.3 30.0 - 36.0 g/dL   RDW 12.9 11.5 - 15.5 %   Platelets 201 150 - 400 K/uL   nRBC 0.0 0.0 - 0.2 %   Neutrophils Relative % 87 %   Neutro Abs 8.6 (H) 1.7 - 7.7 K/uL   Lymphocytes Relative 7 %   Lymphs Abs 0.7 0.7 - 4.0 K/uL   Monocytes Relative 6 %   Monocytes Absolute 0.6 0.1 - 1.0 K/uL   Eosinophils Relative 0 %   Eosinophils Absolute 0.0 0.0 - 0.5 K/uL   Basophils Relative 0 %   Basophils Absolute 0.0 0.0 - 0.1 K/uL   Immature Granulocytes 0 %   Abs Immature Granulocytes 0.03 0.00 - 0.07 K/uL    Comment: Performed at [facility], [place] [address], [place], [place] [address]  SARS Coronavirus 2 (CEPHEID - Performed in [place] hospital lab), Hosp Order     Status: None   Collection Time: 12/15/18  1:33 PM   Specimen: Nasopharyngeal Swab  Result Value Ref Range   SARS Coronavirus 2 NEGATIVE NEGATIVE    Comment: (NOTE) If result is NEGATIVE SARS-CoV-2 target nucleic acids are NOT DETECTED. The SARS-CoV-2 RNA is generally detectable in upper and lower  respiratory specimens during the acute phase of infection. The lowest  concentration of SARS-CoV-2 viral copies this assay can detect is 250  copies / mL. A negative result does not preclude SARS-CoV-2 infection  and should not be used as the sole basis for treatment or other  patient management decisions.  A negative result may occur with  improper specimen collection / handling, submission of specimen other  than nasopharyngeal swab, presence of viral mutation(s) within the  areas targeted by this assay, and inadequate number of viral copies  (<250 copies / mL). A negative result must be combined with clinical  observations, patient history, and epidemiological information. If result is POSITIVE SARS-CoV-2 target nucleic acids are DETECTED. The SARS-CoV-2 RNA is generally detectable in upper and lower  respiratory specimens dur ing the acute phase of infection.  Positive  results are indicative of active  infection with SARS-CoV-2.  Clinical  correlation with patient history and other diagnostic information is  necessary to determine patient infection status.  Positive results do  not rule out bacterial infection or co-infection with other viruses. If result is PRESUMPTIVE POSTIVE SARS-CoV-2 nucleic acids MAY BE PRESENT.   A presumptive positive result was obtained on the submitted specimen  and confirmed on repeat testing.  While 2019 novel coronavirus  (SARS-CoV-2) nucleic acids may be present in the submitted sample  additional confirmatory testing may be necessary for epidemiological  and / or clinical management purposes  to differentiate between  SARS-CoV-2 and other Sarbecovirus currently known to infect humans.  If clinically indicated additional testing with an alternate test  methodology [phone]) is advised. The SARS-CoV-2 RNA is generally  detectable in upper and lower respiratory sp ecimens during the acute  phase of infection. The expected result is Negative. Fact Sheet for Patients:  StrictlyIdeas.no Fact Sheet for Healthcare Providers: BankingDealers.co.za This test is not yet approved or cleared by the [place] FDA and has been authorized for detection and/or diagnosis of SARS-CoV-2 by FDA under an Emergency Use Authorization (EUA).  This EUA will remain in effect (meaning this test can be used) for the duration of the COVID-19 declaration under Section 564(b)(1) of the Act, 21 U.S.C. section 360bbb-3(b)(1), unless the authorization is terminated or revoked sooner. Performed at [facility], [place] [address] [place], [place] [address]     Ct Thoracic Spine Wo Contrast  Result Date: 12/15/2018 CLINICAL DATA:  Severe back pain since Monday. Injured while trying to help husband from falling. EXAM: CT THORACIC SPINE WITHOUT CONTRAST TECHNIQUE: Multidetector CT images of the thoracic were obtained using  the standard protocol without intravenous contrast. COMPARISON:  MRI thoracic spine 09/19/2015 FINDINGS: Alignment: Exaggerated thoracic kyphosis but the vertebral bodies are normally aligned. Vertebrae: There is a remote T10 compression fracture, markedly progressive since 2017 with vertebral plana appearance. Mild retropulsion but no significant canal compromise. Acute appearing severe compression fracture of T7 with minimal retropulsion but no canal compromise. Compression is estimated at 60% and there is paraspinal hematoma. Compression fracture of L1 involving the superior endplate. This was not present on prior lumbar radiographs from 02/18/2018. I do not see any obvious paraspinal hematoma and I do not think this is an acute fracture. Paraspinal and other soft tissues: Paraspinal hematoma noted at T7. There are small bilateral pleural effusions and bibasilar atelectasis. Aortic calcifications without definite aneurysm. The visualized posterior ribs are intact. Disc levels: Minimal retropulsion at T7 mainly involving the inferior aspect of the vertebral body. There also appears to be a faintly calcified disc protrusion but the spinal canal is generous in is no significant spinal stenosis. Mild retropulsion at T10 with mild canal encroachment and mild spinal stenosis. Mild retropulsion of the posterosuperior aspect of L1 with mild canal encroachment but no significant stenosis. No spinal canal hematoma IMPRESSION: 1. Acute appearing and severe compression fracture of T7 with paraspinal hematoma. No significant retropulsion or canal compromise. 2. Remote but progressive T10 compression fracture. No significant change since lumbar radiographs from 2019. Mild retropulsion and mild canal compromise. 3. Indeterminate age L1 superior endplate fracture with mild retropulsion. 4. MRI may be helpful for further evaluation. Electronically Signed   By: Marijo Sanes M.D.   On: 12/15/2018 11:33    Review of Systems   Constitutional: Negative.   HENT: Negative.   Eyes: Negative.   Respiratory: Negative.   Cardiovascular: Negative.   Gastrointestinal: Positive for constipation and nausea.  Genitourinary: Positive for urgency. Negative for flank pain and hematuria.  Musculoskeletal: Positive for back pain. Negative for falls and neck pain.  Skin: Negative.   Neurological: Negative for dizziness, loss of consciousness and weakness.  Endo/Heme/Allergies: Negative.   Psychiatric/Behavioral: Negative.    Blood pressure 116/71, pulse 82, temperature 98.2 F (36.8  C), temperature source Oral, resp. rate 16, height 5\' 1"  (1.549 m), weight 53.8 kg, SpO2 94 %. Physical Exam  Constitutional: She is oriented to person, place, and time. She appears well-developed and well-nourished.  HENT:  Head: Normocephalic and atraumatic.  Eyes: Pupils are equal, round, and reactive to light. Conjunctivae are normal.  Neck: Normal range of motion. Neck supple.  Cardiovascular: Normal rate, regular rhythm and intact distal pulses.  Respiratory: Effort normal.  GI: Soft. Bowel sounds are normal.  Musculoskeletal: Normal range of motion.     Thoracic back: She exhibits bony tenderness.  Neurological: She is alert and oriented to person, place, and time. She has normal strength. No cranial nerve deficit or sensory deficit.  Skin: Skin is warm, dry and intact.  Psychiatric: Her speech is normal and behavior is normal. Thought content normal. Her mood appears anxious. Cognition and memory are normal.    Assessment/Plan: Mrs. Pereda was admitted for pain management and further work up of a T7 acute compression fracture on 12/15/2018. CT scan revealed an acute appearing compression fracture of T7 with a paraspinal hematoma. There is no significant retropulsion or canal compromise. There is also a remote, but progressive, T10 compression fracture, with no significant change since lumbar radiographs from 2019. There is also an  indeterminate age L1 superior endplate fracture with mild retropulsion. She appears to be symptomatic from the T7 compression fracture. Her pain is somewhat controlled with Norco and Robaxin.  -MRI ordered for further evaluation of thoracic spine. -Possible kyphoplasty later this week -Continue pain management  Patricia Nettle 12/16/2018, 11:36 AM

## 2018-12-16 NOTE — Evaluation (Signed)
Occupational Therapy Evaluation Patient Details Name: Jill Mcdowell MRN: [id number] DOB: Jan 17, 1927 Today's Date: 12/16/2018    History of Present Illness Patient presenting to the ED on 12/15/18 with primary complaints of severe back pain. Diagnosed with T7 compression fracture - awaiting neurosurgery consult per chart. past medical history significant of PVCs.    Clinical Impression   Patient presenting with increased pain, decreased endurance, safety awareness, self care, balance, functional mobility/transfers, and strength. 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 Patient currently functioning min A overall but limited by pain. Pt needing education and AE as needed to increase I with self care tasks while maintaining precautions. Patient will benefit from acute OT to increase overall independence in the areas of ADLs, functional mobility, and safety awareness in order to safely discharge home.    Follow Up Recommendations  Supervision - Intermittent;Home health OT    Equipment Recommendations  None recommended by OT    Recommendations for Other Services (none at this time)     Precautions / Restrictions Precautions Precautions: Fall;Back Precaution Comments: discussed back precautions of no bending, arching, and twisting until seen by MD with good understanding Restrictions Weight Bearing Restrictions: No      Mobility Bed Mobility Overal bed mobility: Needs Assistance Bed Mobility: Supine to Sit     Supine to sit: Supervision     General bed mobility comments: supine <> sit - use of bed rails; pivots to bring LE in/out of bed - cuing for log roll  Transfers Overall transfer level: Needs assistance Equipment used: 1 person hand held assist Transfers: Sit to/from Stand Sit to Stand: Min guard         General transfer comment: pt declined secondary to increased pain this session    Balance Overall balance assessment: Mild deficits  observed, not formally tested          ADL either performed or assessed with clinical judgement   ADL Overall ADL's : Needs assistance/impaired Eating/Feeding: Supervision/ safety   Grooming: Wash/dry hands;Wash/dry face;Oral care;Sitting;Set up   Upper Body Bathing: Set up;Sitting           Vision Baseline Vision/History: Wears glasses Wears Glasses: At all times Patient Visual Report: No change from baseline Vision Assessment?: No apparent visual deficits            Pertinent Vitals/Pain Pain Assessment: 0-10 Pain Score: 8  Pain Location: back Pain Descriptors / Indicators: Aching;Discomfort;Guarding;Restless Pain Intervention(s): Limited activity within patient's tolerance;Monitored during session;Repositioned;Patient requesting pain meds-RN notified     Hand Dominance Right   Extremity/Trunk Assessment Upper Extremity Assessment Upper Extremity Assessment: Overall WFL for tasks assessed   Lower Extremity Assessment Lower Extremity Assessment: Generalized weakness   Cervical / Trunk Assessment Cervical / Trunk Assessment: Kyphotic   Communication Communication Communication: No difficulties   Cognition Arousal/Alertness: Awake/alert Behavior During Therapy: WFL for tasks assessed/performed Overall Cognitive Status: Within Functional Limits for tasks assessed                     Home Living Family/patient expects to be discharged to:: Private residence Living Arrangements: Spouse/significant other Available Help at Discharge: Family;Available PRN/intermittently Type of Home: House Home Access: Stairs to enter [employer] of Steps: 2 Entrance Stairs-Rails: Left Home Layout: One level;Laundry or work area in basement     [employer]: Occupational psychologist: [employer]: Shower seat;Grab bars - tub/shower;Bedside commode  Prior Functioning/Environment Level of Independence:  Independent        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        OT Problem List: Decreased strength;Pain;Decreased activity tolerance;Decreased safety awareness;Impaired balance (sitting and/or standing)      OT Treatment/Interventions: Self-care/ADL training;Therapeutic exercise;Therapeutic activities;Energy conservation;DME and/or AE instruction;Patient/family education;Modalities;Balance training;Manual therapy    OT Goals(Current goals can be found in the care plan section) Acute Rehab OT Goals Patient Stated Goal: reduce pain OT Goal Formulation: With patient Time For Goal Achievement: 12/30/18 Potential to Achieve Goals: Fair ADL Goals Pt Will Perform Grooming: with modified independence Pt Will Perform Upper Body Bathing: with modified independence Pt Will Perform Lower Body Bathing: with modified independence Pt Will Perform Upper Body Dressing: with modified independence Pt Will Perform Lower Body Dressing: with modified independence Pt Will Transfer to Toilet: with modified independence Pt Will Perform Toileting - Clothing Manipulation and hygiene: with modified independence Pt Will Perform Tub/Shower Transfer: Shower transfer;ambulating;with supervision  OT Frequency: Min 2X/week   Barriers to D/C: Other (comment)  pt is caregiver to her husband but reports she has family and friends that can intermittently assist          AM-PAC OT "6 Clicks" Daily Activity     Outcome Measure Help from another person eating meals?: None Help from another person taking care of personal grooming?: A Little Help from another person toileting, which includes using toliet, bedpan, or urinal?: A Lot Help from another person bathing (including washing, rinsing, drying)?: A Lot Help from another person to put on and taking off regular upper body clothing?: A Little Help from another person to put on and taking off regular lower body clothing?: A Lot 6 Click Score: 16   End of Session  Nurse Communication: Patient requests pain meds  Activity Tolerance: Patient limited by pain Patient left: in bed;with call bell/phone within reach;with bed alarm set  OT Visit Diagnosis: Muscle weakness (generalized) (M62.81);Unsteadiness on feet (R26.81);Pain Pain - part of body: (back)                Time: 1340-1355 OT Time Calculation (min): 15 min Charges:  OT General Charges $OT Visit: 1 Visit OT Evaluation $OT Eval Low Complexity: 1 Low  Zainab Crumrine P, MS, OTR/L 12/16/2018, 2:45 PM

## 2018-12-16 NOTE — Evaluation (Signed)
Physical Therapy Evaluation Patient Details Name: Jill Mcdowell MRN: [id number] DOB: January 26, 1927 Today's Date: 12/16/2018   History of Present Illness  Patient presenting to the ED on 12/15/18 with primary complaints of severe back pain. Diagnosed with T7 compression fracture - awaiting neurosurgery consult per chart. past medical history significant of PVCs.     Clinical Impression  Patient admitted with the above listed diagnosis. Patient reports independence with mobility and ADLs prior to admission where she lived at home with her husband and was his primary caregiver. Patient today performing bed mobility and limited gait to restroom without AD. Will currently recommend HHPT at discharge with PT to continue to progress safe functional mobility as patient tolerates.      Follow Up Recommendations Home health PT;Supervision - Intermittent    Equipment Recommendations  None recommended by PT    Recommendations for Other Services       Precautions / Restrictions Precautions Precautions: Fall;Back Precaution Comments: discussed back precautions of no bending, arching, and twisting until seen by MD with good understanding Restrictions Weight Bearing Restrictions: No      Mobility  Bed Mobility Overal bed mobility: Modified Independent             General bed mobility comments: supine <> sit - use of bed rails; pivots to bring LE in/out of bed  Transfers Overall transfer level: Needs assistance Equipment used: 1 person hand held assist Transfers: Sit to/from Stand Sit to Stand: Min guard         General transfer comment: min guard for initial steadying  Ambulation/Gait Ambulation/Gait assistance: Min guard Gait Distance (Feet): 15 Feet(x2) Assistive device: 1 person hand held assist Gait Pattern/deviations: Step-to pattern;Step-through pattern;Decreased stride length Gait velocity: decreased   General Gait Details: short step length with 1 HHA for safety -  limited mobility to in room as patient is awaiting neurosurgery consult  Stairs            Wheelchair Mobility    Modified Rankin (Stroke Patients Only)       Balance Overall balance assessment: Mild deficits observed, not formally tested                                           Pertinent Vitals/Pain Pain Assessment: 0-10 Pain Score: 8  Pain Location: back Pain Descriptors / Indicators: Aching;Discomfort;Guarding Pain Intervention(s): Limited activity within patient's tolerance;Monitored during session;Repositioned;Patient requesting pain meds-RN notified    Home Living Family/patient expects to be discharged to:: Private residence Living Arrangements: Spouse/significant other Available Help at Discharge: Family;Available PRN/intermittently Type of Home: House Home Access: Stairs to enter Entrance Stairs-Rails: Left Entrance Stairs-Number of Steps: 2 Home Layout: One level;Laundry or work area in basement(has a Scientist, research (life sciences) installed) Home Equipment: Shower seat;Grab bars - tub/shower;Bedside commode      Prior Function Level of Independence: Independent         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     Hand Dominance        Extremity/Trunk Assessment   Upper Extremity Assessment Upper Extremity Assessment: Overall WFL for tasks assessed    Lower Extremity Assessment Lower Extremity Assessment: Generalized weakness    Cervical / Trunk Assessment Cervical / Trunk Assessment: Kyphotic  Communication   Communication: No difficulties  Cognition Arousal/Alertness: Awake/alert Behavior During Therapy: WFL for tasks assessed/performed Overall Cognitive Status: Within Functional Limits for tasks assessed  General Comments      Exercises     Assessment/Plan    PT Assessment Patient needs continued PT services  PT Problem List Decreased strength;Decreased activity  tolerance;Decreased balance;Decreased mobility;Decreased knowledge of use of DME;Decreased safety awareness       PT Treatment Interventions DME instruction;Gait training;Stair training;Functional mobility training;Therapeutic activities;Therapeutic exercise;Balance training;Patient/family education    PT Goals (Current goals can be found in the Care Plan section)  Acute Rehab PT Goals Patient Stated Goal: reduce pain PT Goal Formulation: With patient Time For Goal Achievement: 12/30/18 Potential to Achieve Goals: Good    Frequency Min 5X/week   Barriers to discharge        Co-evaluation               AM-PAC PT "6 Clicks" Mobility  Outcome Measure Help needed turning from your back to your side while in a flat bed without using bedrails?: A Little Help needed moving from lying on your back to sitting on the side of a flat bed without using bedrails?: A Little Help needed moving to and from a bed to a chair (including a wheelchair)?: A Little Help needed standing up from a chair using your arms (e.g., wheelchair or bedside chair)?: A Little Help needed to walk in hospital room?: A Little Help needed climbing 3-5 steps with a railing? : A Lot 6 Click Score: 17    End of Session Equipment Utilized During Treatment: Gait belt Activity Tolerance: Patient tolerated treatment well Patient left: in bed;with call bell/phone within reach Nurse Communication: Mobility status PT Visit Diagnosis: Unsteadiness on feet (R26.81);Other abnormalities of gait and mobility (R26.89);Muscle weakness (generalized) (M62.81);Pain Pain - part of body: (back)    Time: 1040-1056 PT Time Calculation (min) (ACUTE ONLY): 16 min   Charges:   PT Evaluation $PT Eval Low Complexity: 1 Low          Lanney Gins, PT, DPT Supplemental Physical Therapist 12/16/18 1:11 PM Pager: [phone] Office: [phone]

## 2018-12-17 ENCOUNTER — Other Ambulatory Visit: Payer: Self-pay | Admitting: Neurosurgery

## 2018-12-17 LAB — BASIC METABOLIC PANEL
Anion gap: 9 (ref 5–15)
BUN: 14 mg/dL (ref 8–23)
CO2: 28 mmol/L (ref 22–32)
Calcium: 8.8 mg/dL — ABNORMAL LOW (ref 8.9–10.3)
Chloride: 100 mmol/L (ref 98–111)
Creatinine, Ser: 0.79 mg/dL (ref 0.44–1.00)
GFR calc Af Amer: >60 mL/min (ref >60)
GFR calc non Af Amer: >60 mL/min (ref >60)
Glucose, Bld: 101 mg/dL — ABNORMAL HIGH (ref 70–99)
Potassium: 3.8 mmol/L (ref 3.5–5.1)
Sodium: 137 mmol/L (ref 135–145)

## 2018-12-17 LAB — CBC WITH DIFFERENTIAL/PLATELET
Abs Immature Granulocytes: 0.03 10*3/uL (ref 0.00–0.07)
Basophils Absolute: 0.1 10*3/uL (ref 0.0–0.1)
Basophils Relative: 1 %
Eosinophils Absolute: 0.1 10*3/uL (ref 0.0–0.5)
Eosinophils Relative: 1 %
HCT: 43 % (ref 36.0–46.0)
Hemoglobin: 14 g/dL (ref 12.0–15.0)
Immature Granulocytes: 0 %
Lymphocytes Relative: 15 %
Lymphs Abs: 1.6 10*3/uL (ref 0.7–4.0)
MCH: 29.9 pg (ref 26.0–34.0)
MCHC: 32.6 g/dL (ref 30.0–36.0)
MCV: 91.7 fL (ref 80.0–100.0)
Monocytes Absolute: 1 10*3/uL (ref 0.1–1.0)
Monocytes Relative: 10 %
Neutro Abs: 7.5 10*3/uL (ref 1.7–7.7)
Neutrophils Relative %: 73 %
Platelets: 194 10*3/uL (ref 150–400)
RBC: 4.69 MIL/uL (ref 3.87–5.11)
RDW: 12.9 % (ref 11.5–15.5)
WBC: 10.3 10*3/uL (ref 4.0–10.5)
nRBC: 0 % (ref 0.0–0.2)

## 2018-12-17 NOTE — Plan of Care (Addendum)
Patient stable, discussed POC with patient, patient's son updated via telephone, agreeable with plan, denies question/concerns at this time.

## 2018-12-17 NOTE — Progress Notes (Signed)
PROGRESS NOTE    Jill Mcdowell  YTK:[id number] DOB: [birth year] DOA: 12/15/2018 PCP: Leighton Ruff, MD   Brief Narrative:  Jill Mcdowell is a 85 y.o. female with medical history significant of PVCs who presents to the emergency department on 12/15/2018 due to persistent severe back pain and was eventually diagnosed with T7 compression fracture and was transferred to [facility] for neurosurgery evaluation and possible kyphoplasty.  Consultants:   Neurosurgery  Procedures:   None  Antimicrobials:   None   Subjective: Patient seen and examined.  Neurosurgeon was in the room explaining her the procedure of kyphoplasty when I entered the room.  Patient states that she continues to have constant pain 10 out of 10 in the upper back area which is now radiating to the left side.  She has no other complaint.  She has opted to go for kyphoplasty which will be scheduled for tomorrow or day after tomorrow based on the schedule.  Objective: Vitals:   12/16/18 1952 12/17/18 0026 12/17/18 0446 12/17/18 0723  BP: 123/70 129/67 132/76 (!) 145/72  Pulse: 90 82 84 87  Resp: 18 18 18 18   Temp: 98.5 F (36.9 C) 98 F (36.7 C) 98.4 F (36.9 C) 98 F (36.7 C)  TempSrc: Oral Oral Oral Oral  SpO2: 93% 92% 92% 91%  Weight:      Height:        Intake/Output Summary (Last 24 hours) at 12/17/2018 0738 Last data filed at 12/17/2018 0400 Gross per 24 hour  Intake 56.02 ml  Output --  Net 56.02 ml   Filed Weights   12/15/18 2257  Weight: 53.8 kg    Examination:  General exam: Appears calm and comfortable  Respiratory system: Clear to auscultation. Respiratory effort normal. Cardiovascular system: S1 & S2 heard, RRR. No JVD, murmurs, rubs, gallops or clicks. No pedal edema. Gastrointestinal system: Abdomen is nondistended, soft and nontender. No organomegaly or masses felt. Normal bowel sounds heard. Central nervous system: Alert and oriented. No focal neurological deficits.   Paraspinal muscle tenderness in the midthoracic area Extremities: Symmetric 5 x 5 power. Skin: No rashes, lesions or ulcers Psychiatry: Judgement and insight appear normal. Mood & affect appropriate.    Data Reviewed: I have personally reviewed following labs and imaging studies  CBC: Recent Labs  Lab 12/13/18 2347 12/15/18 1020  WBC 8.1 10.0  NEUTROABS 5.8 8.6*  HGB 13.5 15.1*  HCT 43.0 46.8*  MCV 94.1 92.9  PLT 176 557   Basic Metabolic Panel: Recent Labs  Lab 12/13/18 2347 12/15/18 1020  NA 140 137  K 3.9 3.8  CL 105 100  CO2 27 26  GLUCOSE 117* 127*  BUN 13 10  CREATININE 0.71 0.61  CALCIUM 8.8* 9.2   GFR: Estimated Creatinine Clearance: 36 mL/min (by C-G formula based on SCr of 0.61 mg/dL). Liver Function Tests: Recent Labs  Lab 12/15/18 1020  AST 22  ALT 19  ALKPHOS 75  BILITOT 1.5*  PROT 7.7  ALBUMIN 4.5   No results for input(s): LIPASE, AMYLASE in the last 168 hours. No results for input(s): AMMONIA in the last 168 hours. Coagulation Profile: No results for input(s): INR, PROTIME in the last 168 hours. Cardiac Enzymes: No results for input(s): CKTOTAL, CKMB, CKMBINDEX, TROPONINI in the last 168 hours. BNP (last 3 results) No results for input(s): PROBNP in the last 8760 hours. HbA1C: No results for input(s): HGBA1C in the last 72 hours. CBG: No results for input(s): GLUCAP in the  last 168 hours. Lipid Profile: No results for input(s): CHOL, HDL, LDLCALC, TRIG, CHOLHDL, LDLDIRECT in the last 72 hours. Thyroid Function Tests: No results for input(s): TSH, T4TOTAL, FREET4, T3FREE, THYROIDAB in the last 72 hours. Anemia Panel: No results for input(s): VITAMINB12, FOLATE, FERRITIN, TIBC, IRON, RETICCTPCT in the last 72 hours. Sepsis Labs: No results for input(s): PROCALCITON, LATICACIDVEN in the last 168 hours.  Recent Results (from the past 240 hour(s))  Urine culture     Status: Abnormal   Collection Time: 12/13/18 11:47 PM   Specimen:  Urine, Random  Result Value Ref Range Status   Specimen Description   Final    URINE, RANDOM Performed at [facility] [address], [place], [place] [address]    Special Requests   Final    NONE Performed at [facility], [place] [address] [place], [place] [address]    Culture (A)  Final    <10,000 COLONIES/mL INSIGNIFICANT GROWTH Performed at [facility] [address], [place], [place] [address]    Report Status 12/15/2018 FINAL  Final  Urine culture     Status: None   Collection Time: 12/15/18 10:20 AM   Specimen: Urine, Random  Result Value Ref Range Status   Specimen Description   Final    URINE, RANDOM Performed at [facility] [address], [place], [place] [address]    Special Requests   Final    NONE Performed at [facility], [place] [address] [place], [place] [address]    Culture   Final    NO GROWTH Performed at [facility], [place] [address], [place], [place] [address]    Report Status 12/16/2018 FINAL  Final  SARS Coronavirus 2 (CEPHEID - Performed in [place] hospital lab), Hosp Order     Status: None   Collection Time: 12/15/18  1:33 PM   Specimen: Nasopharyngeal Swab  Result Value Ref Range Status   SARS Coronavirus 2 NEGATIVE NEGATIVE Final    Comment: (NOTE) If result is NEGATIVE SARS-CoV-2 target nucleic acids are NOT DETECTED. The SARS-CoV-2 RNA is generally detectable in upper and lower  respiratory specimens during the acute phase of infection. The lowest  concentration of SARS-CoV-2 viral copies this assay can detect is 250  copies / mL. A negative result does not preclude SARS-CoV-2 infection  and should not be used as the sole basis for treatment or other  patient management decisions.  A negative result may occur with  improper specimen collection / handling, submission of specimen other  than nasopharyngeal swab, presence of viral mutation(s) within  the  areas targeted by this assay, and inadequate number of viral copies  (<250 copies / mL). A negative result must be combined with clinical  observations, patient history, and epidemiological information. If result is POSITIVE SARS-CoV-2 target nucleic acids are DETECTED. The SARS-CoV-2 RNA is generally detectable in upper and lower  respiratory specimens dur ing the acute phase of infection.  Positive  results are indicative of active infection with SARS-CoV-2.  Clinical  correlation with patient history and other diagnostic information is  necessary to determine patient infection status.  Positive results do  not rule out bacterial infection or co-infection with other viruses. If result is PRESUMPTIVE POSTIVE SARS-CoV-2 nucleic acids MAY BE PRESENT.   A presumptive positive result was obtained on the submitted specimen  and confirmed on repeat testing.  While 2019 novel coronavirus  (SARS-CoV-2) nucleic acids may  be present in the submitted sample  additional confirmatory testing may be necessary for epidemiological  and / or clinical management purposes  to differentiate between  SARS-CoV-2 and other Sarbecovirus currently known to infect humans.  If clinically indicated additional testing with an alternate test  methodology [phone]) is advised. The SARS-CoV-2 RNA is generally  detectable in upper and lower respiratory sp ecimens during the acute  phase of infection. The expected result is Negative. Fact Sheet for Patients:  StrictlyIdeas.no Fact Sheet for Healthcare Providers: BankingDealers.co.za This test is not yet approved or cleared by the [place] FDA and has been authorized for detection and/or diagnosis of SARS-CoV-2 by FDA under an Emergency Use Authorization (EUA).  This EUA will remain in effect (meaning this test can be used) for the duration of the COVID-19 declaration under Section 564(b)(1) of the Act, 21  U.S.C. section 360bbb-3(b)(1), unless the authorization is terminated or revoked sooner. Performed at [facility], [place] [address] [place], [place] [address]       Radiology Studies: Ct Thoracic Spine Wo Contrast  Result Date: 12/15/2018 CLINICAL DATA:  Severe back pain since Monday. Injured while trying to help husband from falling. EXAM: CT THORACIC SPINE WITHOUT CONTRAST TECHNIQUE: Multidetector CT images of the thoracic were obtained using the standard protocol without intravenous contrast. COMPARISON:  MRI thoracic spine 09/19/2015 FINDINGS: Alignment: Exaggerated thoracic kyphosis but the vertebral bodies are normally aligned. Vertebrae: There is a remote T10 compression fracture, markedly progressive since 2017 with vertebral plana appearance. Mild retropulsion but no significant canal compromise. Acute appearing severe compression fracture of T7 with minimal retropulsion but no canal compromise. Compression is estimated at 60% and there is paraspinal hematoma. Compression fracture of L1 involving the superior endplate. This was not present on prior lumbar radiographs from 02/18/2018. I do not see any obvious paraspinal hematoma and I do not think this is an acute fracture. Paraspinal and other soft tissues: Paraspinal hematoma noted at T7. There are small bilateral pleural effusions and bibasilar atelectasis. Aortic calcifications without definite aneurysm. The visualized posterior ribs are intact. Disc levels: Minimal retropulsion at T7 mainly involving the inferior aspect of the vertebral body. There also appears to be a faintly calcified disc protrusion but the spinal canal is generous in is no significant spinal stenosis. Mild retropulsion at T10 with mild canal encroachment and mild spinal stenosis. Mild retropulsion of the posterosuperior aspect of L1 with mild canal encroachment but no significant stenosis. No spinal canal hematoma IMPRESSION: 1. Acute appearing and  severe compression fracture of T7 with paraspinal hematoma. No significant retropulsion or canal compromise. 2. Remote but progressive T10 compression fracture. No significant change since lumbar radiographs from 2019. Mild retropulsion and mild canal compromise. 3. Indeterminate age L1 superior endplate fracture with mild retropulsion. 4. MRI may be helpful for further evaluation. Electronically Signed   By: Marijo Sanes M.D.   On: 12/15/2018 11:33   Mr Thoracic Spine Wo Contrast  Result Date: 12/16/2018 CLINICAL DATA:  Initial evaluation for acute compression fracture. EXAM: MRI THORACIC SPINE WITHOUT CONTRAST TECHNIQUE: Multiplanar, multisequence MR imaging of the thoracic spine was performed. No intravenous contrast was administered. COMPARISON:  Prior CT from 12/15/2018. FINDINGS: Alignment: Vertebral bodies normally aligned with preservation of the normal thoracic kyphosis. No listhesis or subluxation. Vertebrae: Acute to subacute appearing compression fracture involving the T7 vertebral body with up to approximately 50% height loss. Associated 5 mm bony retropulsion at the inferior aspect of the posterior T7 vertebral body. Fracture  is mechanical in appearance with no underlying pathologic lesion. Severe chronic compression deformity of the T10 vertebral body with up to 3 mm bony retropulsion. Additional chronic compression deformity seen involving the superior endplate of L1 with up to approximately 35% height loss with trace 2 mm bony retropulsion. Vertebral body heights otherwise maintained with no other acute or chronic fracture identified. Underlying bone marrow signal intensity within normal limits. No discrete or worrisome osseous lesions. No other abnormal marrow edema. Cord: Signal intensity within the thoracic spinal cord is normal. Normal cord caliber and morphology. Paraspinal and other soft tissues: Paraspinous edema seen adjacent to the T7 compression fracture. Paraspinous soft tissues  demonstrate no other acute finding. Trace layering bilateral pleural effusions noted. 4.5 cm simple left renal cyst partially visualized. Visualized visceral structures otherwise unremarkable. Disc levels: T1-2:  Unremarkable. T2-3: Unremarkable. T3-4:  Unremarkable. T4-5:  Unremarkable. T5-6:  Unremarkable. T6-7:  Unremarkable. T7-8: 5 mm bony retropulsion related to the T7 compression fracture. Superimposed posterior element hypertrophy. Resultant mild spinal stenosis without cord impingement. Mild bilateral foraminal narrowing, right slightly worse than left. T8-9:  Unremarkable. T9-10: Mild disc bulging with 3 mm bony retropulsion related to the T10 compression fracture. Flattening and partial effacement of the ventral thecal sac with resultant mild spinal stenosis. Foramina remain patent. T10-11: 3 mm bony retropulsion related to the T10 compression fracture. Mild facet hypertrophy. Resultant mild spinal stenosis without cord impingement. Foramina remain patent. T11-12:  Mild facet hypertrophy.  No stenosis. T12-L1: Minimal disc bulge with bilateral facet hypertrophy. No significant stenosis. IMPRESSION: 1. Acute to early subacute compression fracture of T7 with up to 50% height loss and 5 mm bony retropulsion. Resultant mild spinal stenosis without cord impingement. 2. Additional chronic compression deformities involving the T10 and L1 vertebral bodies as above. 3. Trace layering bilateral pleural effusions. Electronically Signed   By: Jeannine Boga M.D.   On: 12/16/2018 19:05    Scheduled Meds:  diltiazem  180 mg Oral Daily   levothyroxine  50 mcg Oral QAC breakfast   pantoprazole  40 mg Oral Daily   polyethylene glycol  17 g Oral Daily   Continuous Infusions:  sodium chloride Stopped (12/16/18 1008)   methocarbamol (ROBAXIN) IV Stopped (12/16/18 0423)     LOS: 2 days   Assessment & Plan:   Principal Problem:   Back pain Active Problems:   Paroxysmal atrial fibrillation  (HCC)   PVC's (premature ventricular contractions)   Compression fracture of body of thoracic vertebra (HCC)  Back pain/T7 compression fracture: Neurosurgery on board.  She is going to be scheduled for kyphoplasty either tomorrow or day after tomorrow placed on the schedule.  Appreciate neurosurgery help.  History of brief episode of atrial fibrillation in the past: To patient, this happened only once and she is not on any anticoagulation.  She is in sinus rhythm.  History of PVCs: On Cardizem.  Hypothyroidism: Continue Synthroid.  DVT prophylaxis: SCD Code Status: Full code Family Communication: None present at bedside.  Neurosurgery is going to give a call to her son. Disposition Plan: To be determined based on clinical course and recommendations by neurosurgery.   Time spent: 24 minutes   Darliss Cheney, MD [facility] Pager [phone]  If 7PM-7AM, please contact night-coverage [email or web address] Password [facility] 12/17/2018, 7:38 AM

## 2018-12-17 NOTE — Progress Notes (Signed)
Subjective:  The patient is alert and pleasant.  She complains of midthoracic pain.  She does not feel she has improved.  Objective: Vital signs in last 24 hours: Temp:  [98 F (36.7 C)-98.5 F (36.9 C)] 98 F (36.7 C) (06/23 0723) Pulse Rate:  [82-90] 87 (06/23 0723) Resp:  [16-18] 18 (06/23 0723) BP: (116-145)/(67-76) 145/72 (06/23 0723) SpO2:  [91 %-95 %] 91 % (06/23 0723) Estimated body mass index is 22.41 kg/m as calculated from the following:   Height as of this encounter: 5\' 1"  (1.549 m).   Weight as of this encounter: 53.8 kg.   Intake/Output from previous day: 06/22 0701 - 06/23 0700 In: 56 [I.V.:56] Out: -  Intake/Output this shift: No intake/output data recorded.  Physical exam   The patient is alert and oriented.    Her gastrocnemius and dorsiflexors strength is grossly normal bilaterally.  Lab Results: Recent Labs   12/15/18 1020 WBC 10.0 HGB 15.1* HCT 46.8* PLT 201  BMET Recent Labs   12/15/18 1020 NA 137 K 3.8 CL 100 CO2 26 GLUCOSE 127* BUN 10 CREATININE 0.61 CALCIUM 9.2   Studies/Results: Ct Thoracic Spine Wo Contrast  Result Date: 12/15/2018 CLINICAL DATA:  Severe back pain since Monday. Injured while trying to help husband from falling. EXAM: CT THORACIC SPINE WITHOUT CONTRAST TECHNIQUE: Multidetector CT images of the thoracic were obtained using the standard protocol without intravenous contrast. COMPARISON:  MRI thoracic spine 09/19/2015 FINDINGS: Alignment: Exaggerated thoracic kyphosis but the vertebral bodies are normally aligned. Vertebrae: There is a remote T10 compression fracture, markedly progressive since 2017 with vertebral plana appearance. Mild retropulsion but no significant canal compromise. Acute appearing severe compression fracture of T7 with minimal retropulsion but no canal compromise. Compression is estimated at 60% and there is paraspinal hematoma. Compression fracture of L1 involving the superior endplate. This was not  present on prior lumbar radiographs from 02/18/2018. I do not see any obvious paraspinal hematoma and I do not think this is an acute fracture. Paraspinal and other soft tissues: Paraspinal hematoma noted at T7. There are small bilateral pleural effusions and bibasilar atelectasis. Aortic calcifications without definite aneurysm. The visualized posterior ribs are intact. Disc levels: Minimal retropulsion at T7 mainly involving the inferior aspect of the vertebral body. There also appears to be a faintly calcified disc protrusion but the spinal canal is generous in is no significant spinal stenosis. Mild retropulsion at T10 with mild canal encroachment and mild spinal stenosis. Mild retropulsion of the posterosuperior aspect of L1 with mild canal encroachment but no significant stenosis. No spinal canal hematoma IMPRESSION: 1. Acute appearing and severe compression fracture of T7 with paraspinal hematoma. No significant retropulsion or canal compromise. 2. Remote but progressive T10 compression fracture. No significant change since lumbar radiographs from 2019. Mild retropulsion and mild canal compromise. 3. Indeterminate age L1 superior endplate fracture with mild retropulsion. 4. MRI may be helpful for further evaluation. Electronically Signed   By: Marijo Sanes M.D.   On: 12/15/2018 11:33   Mr Thoracic Spine Wo Contrast  Result Date: 12/16/2018 CLINICAL DATA:  Initial evaluation for acute compression fracture. EXAM: MRI THORACIC SPINE WITHOUT CONTRAST TECHNIQUE: Multiplanar, multisequence MR imaging of the thoracic spine was performed. No intravenous contrast was administered. COMPARISON:  Prior CT from 12/15/2018. FINDINGS: Alignment: Vertebral bodies normally aligned with preservation of the normal thoracic kyphosis. No listhesis or subluxation. Vertebrae: Acute to subacute appearing compression fracture involving the T7 vertebral body with up to approximately 50% height loss. Associated  5 mm bony  retropulsion at the inferior aspect of the posterior T7 vertebral body. Fracture is mechanical in appearance with no underlying pathologic lesion. Severe chronic compression deformity of the T10 vertebral body with up to 3 mm bony retropulsion. Additional chronic compression deformity seen involving the superior endplate of L1 with up to approximately 35% height loss with trace 2 mm bony retropulsion. Vertebral body heights otherwise maintained with no other acute or chronic fracture identified. Underlying bone marrow signal intensity within normal limits. No discrete or worrisome osseous lesions. No other abnormal marrow edema. Cord: Signal intensity within the thoracic spinal cord is normal. Normal cord caliber and morphology. Paraspinal and other soft tissues: Paraspinous edema seen adjacent to the T7 compression fracture. Paraspinous soft tissues demonstrate no other acute finding. Trace layering bilateral pleural effusions noted. 4.5 cm simple left renal cyst partially visualized. Visualized visceral structures otherwise unremarkable. Disc levels: T1-2:  Unremarkable. T2-3: Unremarkable. T3-4:  Unremarkable. T4-5:  Unremarkable. T5-6:  Unremarkable. T6-7:  Unremarkable. T7-8: 5 mm bony retropulsion related to the T7 compression fracture. Superimposed posterior element hypertrophy. Resultant mild spinal stenosis without cord impingement. Mild bilateral foraminal narrowing, right slightly worse than left. T8-9:  Unremarkable. T9-10: Mild disc bulging with 3 mm bony retropulsion related to the T10 compression fracture. Flattening and partial effacement of the ventral thecal sac with resultant mild spinal stenosis. Foramina remain patent. T10-11: 3 mm bony retropulsion related to the T10 compression fracture. Mild facet hypertrophy. Resultant mild spinal stenosis without cord impingement. Foramina remain patent. T11-12:  Mild facet hypertrophy.  No stenosis. T12-L1: Minimal disc bulge with bilateral facet  hypertrophy. No significant stenosis. IMPRESSION: 1. Acute to early subacute compression fracture of T7 with up to 50% height loss and 5 mm bony retropulsion. Resultant mild spinal stenosis without cord impingement. 2. Additional chronic compression deformities involving the T10 and L1 vertebral bodies as above. 3. Trace layering bilateral pleural effusions. Electronically Signed   By: Jeannine Boga M.D.   On: 12/16/2018 19:05    Assessment/Plan:   T7 acute compression fracture, thoracic spine pain:  I have discussed the situation with the patient.  We have discussed the various treatment options including continuing medical management and giving it time versus a T7 kyphoplasty.  I have described the procedure of a T7 kyphoplasty.  We discussed the risks including read the risks of anesthesia, hemorrhage, infection, malplacement of the cement, medical risk, adjacent segment fracture, failure to relieve the pain, etc.  We discussed the expected postoperative course and likelihood of achieving our goals with surgery.  I have answered all her questions.  She has decided to proceed with surgery.  We will hopefully do this either tomorrow or Thursday.  LOS: 2 days     Ophelia Charter 12/17/2018, 7:59 AM

## 2018-12-18 ENCOUNTER — Inpatient Hospital Stay (HOSPITAL_COMMUNITY): Payer: Medicare HMO | Admitting: Certified Registered"

## 2018-12-18 ENCOUNTER — Encounter (HOSPITAL_COMMUNITY): Payer: Self-pay | Admitting: Orthopedic Surgery

## 2018-12-18 ENCOUNTER — Inpatient Hospital Stay (HOSPITAL_COMMUNITY): Payer: Medicare HMO

## 2018-12-18 ENCOUNTER — Encounter (HOSPITAL_COMMUNITY)
Admission: EM | Disposition: A | Discharge status: Home / Self Care | Payer: Self-pay | Source: Home / Self Care | Attending: Family Medicine

## 2018-12-18 DIAGNOSIS — S22060A Wedge compression fracture of T7-T8 vertebra, initial encounter for closed fracture: Secondary | ICD-10-CM | POA: Diagnosis not present

## 2018-12-18 DIAGNOSIS — S22000A Wedge compression fracture of unspecified thoracic vertebra, initial encounter for closed fracture: Secondary | ICD-10-CM

## 2018-12-18 DIAGNOSIS — M546 Pain in thoracic spine: Secondary | ICD-10-CM

## 2018-12-18 HISTORY — PX: KYPHOPLASTY: SHX5884

## 2018-12-18 LAB — SURGICAL PCR SCREEN
MRSA, PCR: NEGATIVE
Staphylococcus aureus: NEGATIVE

## 2018-12-18 SURGERY — KYPHOPLASTY
Anesthesia: General | Site: Spine Thoracic

## 2018-12-18 MED ORDER — PROPOFOL 10 MG/ML IV BOLUS
INTRAVENOUS | Status: DC | PRN
  Administered 2018-12-18: 80 mg via INTRAVENOUS

## 2018-12-18 MED ORDER — SODIUM CHLORIDE 0.9% FLUSH
3.0000 mL | Freq: Two times a day (BID) | INTRAVENOUS | Status: DC
  Administered 2018-12-18 – 2018-12-19 (×3): 3 mL via INTRAVENOUS

## 2018-12-18 MED ORDER — BISACODYL 10 MG RE SUPP: 10.0000 mg | Freq: Every day | RECTAL | Status: DC | PRN

## 2018-12-18 MED ORDER — LACTATED RINGERS IV SOLN
INTRAVENOUS | Status: DC
  Administered 2018-12-18: 15:00:00 via INTRAVENOUS

## 2018-12-18 MED ORDER — SENNOSIDES-DOCUSATE SODIUM 8.6-50 MG PO TABS
1.0000 | ORAL_TABLET | Freq: Two times a day (BID) | ORAL | Status: DC
  Administered 2018-12-18 – 2018-12-19 (×2): 1 via ORAL
  Filled 2018-12-18 (×2): qty 1

## 2018-12-18 MED ORDER — SUCCINYLCHOLINE CHLORIDE 20 MG/ML IJ SOLN
INTRAMUSCULAR | Status: DC | PRN
  Administered 2018-12-18: 120 mg via INTRAVENOUS

## 2018-12-18 MED ORDER — SODIUM CHLORIDE 0.9% FLUSH: 3.0000 mL | INTRAVENOUS | Status: DC | PRN

## 2018-12-18 MED ORDER — ACETAMINOPHEN 325 MG PO TABS: 650.0000 mg | ORAL_TABLET | ORAL | Status: DC | PRN

## 2018-12-18 MED ORDER — SODIUM CHLORIDE 0.9 % IV SOLN
250.0000 mL | INTRAVENOUS | Status: DC
  Administered 2018-12-18: 250 mL via INTRAVENOUS

## 2018-12-18 MED ORDER — DOCUSATE SODIUM 100 MG PO CAPS
100.0000 mg | ORAL_CAPSULE | Freq: Two times a day (BID) | ORAL | Status: DC
  Administered 2018-12-18 – 2018-12-19 (×2): 100 mg via ORAL
  Filled 2018-12-18 (×2): qty 1

## 2018-12-18 MED ORDER — LIDOCAINE 2% (20 MG/ML) 5 ML SYRINGE
INTRAMUSCULAR | Status: DC | PRN
  Administered 2018-12-18: 60 mg via INTRAVENOUS

## 2018-12-18 MED ORDER — BUPIVACAINE-EPINEPHRINE (PF) 0.5% -1:200000 IJ SOLN
INTRAMUSCULAR | Status: AC
  Filled 2018-12-18: qty 30

## 2018-12-18 MED ORDER — ONDANSETRON HCL 4 MG PO TABS: 4.0000 mg | ORAL_TABLET | Freq: Four times a day (QID) | ORAL | Status: DC | PRN

## 2018-12-18 MED ORDER — ACETAMINOPHEN 650 MG RE SUPP: 650.0000 mg | RECTAL | Status: DC | PRN

## 2018-12-18 MED ORDER — ONDANSETRON HCL 4 MG/2ML IJ SOLN
INTRAMUSCULAR | Status: AC
  Filled 2018-12-18: qty 2

## 2018-12-18 MED ORDER — OXYCODONE HCL 5 MG PO TABS: 5.0000 mg | ORAL_TABLET | ORAL | Status: DC | PRN

## 2018-12-18 MED ORDER — ONDANSETRON HCL 4 MG/2ML IJ SOLN: 4.0000 mg | Freq: Four times a day (QID) | INTRAMUSCULAR | Status: DC | PRN

## 2018-12-18 MED ORDER — MORPHINE SULFATE (PF) 4 MG/ML IV SOLN: 4.0000 mg | INTRAVENOUS | Status: DC | PRN

## 2018-12-18 MED ORDER — ZOLPIDEM TARTRATE 5 MG PO TABS: 5.0000 mg | ORAL_TABLET | Freq: Every evening | ORAL | Status: DC | PRN

## 2018-12-18 MED ORDER — FENTANYL CITRATE (PF) 250 MCG/5ML IJ SOLN
INTRAMUSCULAR | Status: AC
  Filled 2018-12-18: qty 5

## 2018-12-18 MED ORDER — BUPIVACAINE-EPINEPHRINE 0.5% -1:200000 IJ SOLN
INTRAMUSCULAR | Status: DC | PRN
  Administered 2018-12-18: 5 mL

## 2018-12-18 MED ORDER — CEFAZOLIN SODIUM-DEXTROSE 2-4 GM/100ML-% IV SOLN
2.0000 g | Freq: Three times a day (TID) | INTRAVENOUS | Status: AC
  Administered 2018-12-18 – 2018-12-19 (×2): 2 g via INTRAVENOUS
  Filled 2018-12-18 (×2): qty 100

## 2018-12-18 MED ORDER — ACETAMINOPHEN 500 MG PO TABS
1000.0000 mg | ORAL_TABLET | Freq: Four times a day (QID) | ORAL | Status: DC
  Administered 2018-12-18 – 2018-12-19 (×3): 1000 mg via ORAL
  Filled 2018-12-18 (×3): qty 2

## 2018-12-18 MED ORDER — FENTANYL CITRATE (PF) 100 MCG/2ML IJ SOLN
INTRAMUSCULAR | Status: DC | PRN
  Administered 2018-12-18: 50 ug via INTRAVENOUS
  Administered 2018-12-18: 25 ug via INTRAVENOUS

## 2018-12-18 MED ORDER — CYCLOBENZAPRINE HCL 10 MG PO TABS: 10.0000 mg | ORAL_TABLET | Freq: Three times a day (TID) | ORAL | Status: DC | PRN

## 2018-12-18 MED ORDER — MENTHOL 3 MG MT LOZG: 1.0000 | LOZENGE | OROMUCOSAL | Status: DC | PRN

## 2018-12-18 MED ORDER — CEFAZOLIN SODIUM-DEXTROSE 2-4 GM/100ML-% IV SOLN
2.0000 g | INTRAVENOUS | Status: AC
  Administered 2018-12-18: 2 g via INTRAVENOUS
  Filled 2018-12-18: qty 100

## 2018-12-18 MED ORDER — PHENYLEPHRINE 40 MCG/ML (10ML) SYRINGE FOR IV PUSH (FOR BLOOD PRESSURE SUPPORT)
PREFILLED_SYRINGE | INTRAVENOUS | Status: DC | PRN
  Administered 2018-12-18: 80 ug via INTRAVENOUS
  Administered 2018-12-18: 40 ug via INTRAVENOUS
  Administered 2018-12-18: 80 ug via INTRAVENOUS

## 2018-12-18 MED ORDER — SUCCINYLCHOLINE CHLORIDE 200 MG/10ML IV SOSY
PREFILLED_SYRINGE | INTRAVENOUS | Status: AC
  Filled 2018-12-18: qty 20

## 2018-12-18 MED ORDER — CHLORHEXIDINE GLUCONATE CLOTH 2 % EX PADS
6.0000 | MEDICATED_PAD | Freq: Once | CUTANEOUS | Status: AC
  Administered 2018-12-18: 6 via TOPICAL

## 2018-12-18 MED ORDER — 0.9 % SODIUM CHLORIDE (POUR BTL) OPTIME
TOPICAL | Status: DC | PRN
  Administered 2018-12-18: 1000 mL

## 2018-12-18 MED ORDER — FENTANYL CITRATE (PF) 100 MCG/2ML IJ SOLN: 25.0000 ug | INTRAMUSCULAR | Status: DC | PRN

## 2018-12-18 MED ORDER — IOPAMIDOL (ISOVUE-300) INJECTION 61%
INTRAVENOUS | Status: DC | PRN
  Administered 2018-12-18: 50 mL via INTRAVENOUS

## 2018-12-18 MED ORDER — PHENYLEPHRINE 40 MCG/ML (10ML) SYRINGE FOR IV PUSH (FOR BLOOD PRESSURE SUPPORT)
PREFILLED_SYRINGE | INTRAVENOUS | Status: AC
  Filled 2018-12-18: qty 30

## 2018-12-18 MED ORDER — PHENOL 1.4 % MT LIQD: 1.0000 | OROMUCOSAL | Status: DC | PRN

## 2018-12-18 MED ORDER — EPHEDRINE 5 MG/ML INJ
INTRAVENOUS | Status: AC
  Filled 2018-12-18: qty 20

## 2018-12-18 SURGICAL SUPPLY — 35 items
APL SKNCLS STERI-STRIP NONHPOA (GAUZE/BANDAGES/DRESSINGS) ×1
BENZOIN TINCTURE PRP APPL 2/3 (GAUZE/BANDAGES/DRESSINGS) ×2 IMPLANT
BLADE SURG 15 STRL LF DISP TIS (BLADE) ×1 IMPLANT
BLADE SURG 15 STRL SS (BLADE) ×2
CEMENT BONE KYPHX HV R (Orthopedic Implant) ×1 IMPLANT
DRAPE HALF SHEET 40X57 (DRAPES) ×2 IMPLANT
DRAPE INCISE IOBAN 66X45 STRL (DRAPES) ×2 IMPLANT
DRAPE LAPAROTOMY 100X72X124 (DRAPES) ×2 IMPLANT
DRSG OPSITE POSTOP 4X6 (GAUZE/BANDAGES/DRESSINGS) ×1 IMPLANT
GAUZE 4X4 16PLY RFD (DISPOSABLE) ×2 IMPLANT
GLOVE BIOGEL PI IND STRL 7.0 (GLOVE) IMPLANT
GLOVE BIOGEL PI IND STRL 7.5 (GLOVE) IMPLANT
GLOVE BIOGEL PI IND STRL 8.5 (GLOVE) IMPLANT
GLOVE BIOGEL PI INDICATOR 7.0 (GLOVE) ×1
GLOVE BIOGEL PI INDICATOR 7.5 (GLOVE) ×1
GLOVE BIOGEL PI INDICATOR 8.5 (GLOVE) ×1
GLOVE SS N UNI LF 6.5 STRL (GLOVE) ×2 IMPLANT
GLOVE SS N UNI LF 7.0 STRL (GLOVE) ×2 IMPLANT
GLOVE SS N UNI LF 8.0 STRL (GLOVE) ×2 IMPLANT
GOWN STRL REUS W/ TWL LRG LVL3 (GOWN DISPOSABLE) IMPLANT
GOWN STRL REUS W/ TWL XL LVL3 (GOWN DISPOSABLE) IMPLANT
GOWN STRL REUS W/TWL LRG LVL3 (GOWN DISPOSABLE) ×4
GOWN STRL REUS W/TWL XL LVL3 (GOWN DISPOSABLE) ×2
KIT BASIN OR (CUSTOM PROCEDURE TRAY) ×2 IMPLANT
KIT TURNOVER KIT B (KITS) ×2 IMPLANT
NEEDLE HYPO 22GX1.5 SAFETY (NEEDLE) ×2 IMPLANT
NS IRRIG 1000ML POUR BTL (IV SOLUTION) ×2 IMPLANT
PACK EENT II TURBAN DRAPE (CUSTOM PROCEDURE TRAY) ×2 IMPLANT
PAD ARMBOARD 7.5X6 YLW CONV (MISCELLANEOUS) ×6 IMPLANT
STRIP CLOSURE SKIN 1/2X4 (GAUZE/BANDAGES/DRESSINGS) ×2 IMPLANT
SUT VIC AB 3-0 SH 8-18 (SUTURE) ×2 IMPLANT
SYR CONTROL 10ML LL (SYRINGE) ×2 IMPLANT
TOWEL GREEN STERILE (TOWEL DISPOSABLE) ×2 IMPLANT
TOWEL GREEN STERILE FF (TOWEL DISPOSABLE) ×2 IMPLANT
TRAY KYPHOPAK 15/2 EXPRESS CDS (KITS) ×1 IMPLANT

## 2018-12-18 NOTE — Transfer of Care (Signed)
Immediate Anesthesia Transfer of Care Note  Patient: Jill Mcdowell  Procedure(s) Performed: KYPHOPLASTY, THORACIC SEVEN (N/A Spine Thoracic)  Patient Location: PACU  Anesthesia Type:General  Level of Consciousness: awake and oriented  Airway & Oxygen Therapy: Patient Spontanous Breathing and Patient connected to nasal cannula oxygen  Post-op Assessment: Report given to RN and Patient moving all extremities X 4  Post vital signs: Reviewed and stable  Last Vitals:  Vitals Value Taken Time  BP    Temp    Pulse    Resp 8 12/18/18 1817  SpO2    Vitals shown include unvalidated device data.  Last Pain:  Vitals:   12/18/18 1200  TempSrc:   PainSc: 6       Patients Stated Pain Goal: 2 (93/81/01 7510)  Complications: No apparent anesthesia complications

## 2018-12-18 NOTE — Progress Notes (Signed)
Occupational Therapy Treatment Patient Details Name: Jill Mcdowell MRN: [id number] DOB: 12-29-1926 Today's Date: 12/18/2018    History of present illness Patient presenting to the ED on 12/15/18 with primary complaints of severe back pain. Diagnosed with T7 compression fracture - awaiting neurosurgery consult per chart. past medical history significant of PVCs.    OT comments  Treatment session with focus on functional mobility and bathroom transfers.  Pt completed bed mobility with supervision/cues for maintaining back precautions.  Pt ambulated to toilet with Supervision and completed toileting tasks at overall Mod I level.  Pt demonstrating improved mobility with decreased c/o pain.  Pt stating she is just awaiting surgery and is hopeful for good outcomes.    Follow Up Recommendations  Supervision - Intermittent;Home health OT    Equipment Recommendations  None recommended by OT       Precautions / Restrictions Precautions Precautions: Fall;Back Precaution Comments: discussed back precautions of no bending, arching, and twisting until seen by MD with good understanding Restrictions Weight Bearing Restrictions: No       Mobility Bed Mobility Overal bed mobility: Needs Assistance Bed Mobility: Supine to Sit;Sit to Supine     Supine to sit: Supervision Sit to supine: Supervision   General bed mobility comments: for safety - no assist  Transfers Overall transfer level: Needs assistance Equipment used: 1 person hand held assist Transfers: Sit to/from Stand Sit to Stand: Supervision         General transfer comment: for initial steadying     Balance Overall balance assessment: Mild deficits observed, not formally tested                                         ADL either performed or assessed with clinical judgement   ADL Overall ADL's : Needs assistance/impaired     Grooming: Wash/dry hands;Wash/dry face;Oral care;Standing;Supervision/safety                   Toilet Transfer: Supervision/safety;Ambulation   Toileting- Clothing Manipulation and Hygiene: Modified independent       Functional mobility during ADLs: Supervision/safety       Vision Baseline Vision/History: Wears glasses Wears Glasses: At all times Patient Visual Report: No change from baseline Vision Assessment?: No apparent visual deficits   Perception     Praxis      Cognition Arousal/Alertness: Awake/alert Behavior During Therapy: WFL for tasks assessed/performed Overall Cognitive Status: Within Functional Limits for tasks assessed                                                     Pertinent Vitals/ Pain       Pain Assessment: Faces Faces Pain Scale: Hurts a little bit Pain Location: back Pain Descriptors / Indicators: Aching;Discomfort;Guarding;Restless Pain Intervention(s): Limited activity within patient's tolerance;Monitored during session;Repositioned         Frequency  Min 2X/week        Progress Toward Goals  OT Goals(current goals can now be found in the care plan section)  Progress towards OT goals: Progressing toward goals  Acute Rehab OT Goals Patient Stated Goal: reduce pain OT Goal Formulation: With patient Time For Goal Achievement: 12/30/18 Potential to Achieve Goals: Good  Plan Discharge plan remains appropriate  AM-PAC OT "6 Clicks" Daily Activity     Outcome Measure   Help from another person eating meals?: None Help from another person taking care of personal grooming?: A Little Help from another person toileting, which includes using toliet, bedpan, or urinal?: A Little Help from another person bathing (including washing, rinsing, drying)?: A Lot Help from another person to put on and taking off regular upper body clothing?: A Little Help from another person to put on and taking off regular lower body clothing?: A Lot 6 Click Score: 17    End of Session Equipment  Utilized During Treatment: Gait belt  OT Visit Diagnosis: Muscle weakness (generalized) (M62.81);Unsteadiness on feet (R26.81);Pain Pain - part of body: (back)   Activity Tolerance Patient limited by pain   Patient Left in bed;with call bell/phone within reach;with bed alarm set   Nurse Communication (toileting)        Time: 1188-6773 OT Time Calculation (min): 13 min  Charges: OT General Charges $OT Visit: 1 Visit OT Treatments $Self Care/Home Management : 8-22 mins    Simonne Come, [phone] 12/18/2018, 3:40 PM

## 2018-12-18 NOTE — Op Note (Signed)
Brief history: The patient is an 84 year old white female whose had a previous T10 compression fracture which was treated conservatively.  She recently attempted to stop her husband from falling and had the acute onset of midthoracic pain.  She was worked up with x-rays CT scan and a thoracic MRI which demonstrated a T7 acute compression fracture.  She failed medical management.  I discussed the various treatment options with her.  She has decided to proceed with a T7 kyphoplasty after weighing the risks, benefits and alternatives of surgery.  Preop diagnosis: T7 acute compression fracture, thoracic spine pain  Postop diagnosis: The same  Procedure: T7 kyphoplasty  Surgeon: Dr. Earle Gell  Assistant: None  Anesthesia: General tracheal  Estimated blood loss: Minimal  Specimens: None  Drains: None  Complications: None  Description of procedure: The patient was brought to the operating room by the anesthesia team.  General endotracheal anesthesia was induced.  She was carefully turned to the prone position on the chest rolls.  Her thoracic region was then prepared with Betadine scrub and Betadine solution.  I then made small incisions over the bilateral T7 pedicles.  Under biplanar fluoroscopy I cannulated the bilateral T7 pedicles with the Jamshidi needle.  I then drilled down the cannula, inserted a balloon, inflated balloon, deflated the balloon, remove the balloon, and inserted the bone cement into the T7 vertebral bodies under biplanar fluoroscopy.  After we were satisfied with the placement of cement we remove the cannulas.  I reapproximated patient's subcutaneous tissue with interrupted 3-0 Vicryl suture.  I reapproximated the skin with Steri-Strips and benzoin.  The wound was then coated with bacitracin ointment.  A sterile dressing was applied.  The drapes were removed.  By report all sponge, instrument, and needle counts were correct at the end this case.

## 2018-12-18 NOTE — TOC Initial Note (Signed)
Transition of Care [facility]) - Initial/Assessment Note    Patient Details  Name: Jill Mcdowell MRN: [id number] Date of Birth: 09-18-1926  Transition of [facility]) CM/SW Contact:    Pollie Friar, RN Phone Number: 12/18/2018, 10:52 AM  Clinical Narrative:                 Pt lives at home with spouse that has parkinsons. She has been the caregiver for him at home. He recently was set up with [facility] services through their PCP office. He is active with [facility]. She would like to use the same company. TOC called [facility] and they will accept the pt when ready for d/c. Will need HH orders and F2F.  TOC following.  Expected Discharge Plan: [place] Barriers to Discharge: Continued Medical Work up   Patient Goals and CMS Choice Patient states their goals for this hospitalization and ongoing recovery are:: to return home with home health CMS Medicare.gov Compare Post Acute Care list provided to:: Patient Choice offered to / list presented to : Patient  Expected Discharge Plan and Services Expected Discharge Plan: [place] In-house Referral: Clinical Social Work Discharge Planning Services: CM Consult Post Acute Care Choice: [place] arrangements for the past 2 months: [place]: [facility] Date [place]: 12/18/18 Time [place] Contacted: [phone]    Prior Living Arrangements/Services Living arrangements for the past 2 months: [place] Lives with:: Spouse(who has parkinsons) Patient language and need for interpreter reviewed:: Yes(no needs) Do you feel safe going back to the place where you live?: Yes      Need for Family Participation in Patient Care: Yes (Comment) Care giver support system in place?: Yes (comment)(Family looking into private pay care giver services.)   Criminal Activity/Legal Involvement Pertinent to Current Situation/Hospitalization: No -  Comment as needed  Activities of Daily Living Home Assistive Devices/Equipment: Eyeglasses ADL Screening (condition at time of admission) Patient's cognitive ability adequate to safely complete daily activities?: Yes Is the patient deaf or have difficulty hearing?: No Does the patient have difficulty seeing, even when wearing glasses/contacts?: No Does the patient have difficulty concentrating, remembering, or making decisions?: No Patient able to express need for assistance with ADLs?: Yes Does the patient have difficulty dressing or bathing?: No Independently performs ADLs?: Yes (appropriate for developmental age) Does the patient have difficulty walking or climbing stairs?: No Weakness of Legs: Both Weakness of Arms/Hands: None  Permission Sought/Granted Permission sought to share information with : Case Manager, Family Supports    Share Information with NAME: [place] and [place] granted to share info w AGENCY: [facility]  Permission granted to share info w Relationship: Grandaughter and son  Permission granted to share info w Contact Information: ph: [phone] and [phone]  Emotional Assessment Appearance:: Appears stated age Attitude/Demeanor/Rapport: Engaged Affect (typically observed): Accepting, Pleasant Orientation: : Oriented to Self, Oriented to Place, Oriented to  Time, Oriented to Situation   Psych Involvement: No (comment)  Admission diagnosis:  Dehydration [E86.0] Compression fracture of T7 vertebra, initial encounter ([place]) [S22.060A] Compression fracture of body of thoracic vertebra ([place]) [S22.000A] Patient Active Problem List   Diagnosis Date Noted  . Compression fracture of body of thoracic vertebra ([place]) 12/15/2018  . Back pain  12/15/2018  . PVC's (premature ventricular contractions) 11/05/2017  . Palpitations 11/17/2013  . Syncope, near 11/17/2013  . Paroxysmal atrial fibrillation ([place]) 02/26/2013  . Breast cyst  10/25/2012  . Breast mass, right 10/21/2012   PCP:  Leighton Ruff, MD Pharmacy:   [facility] ([place]) [place], [place] [address] Phone: [phone] Fax: [phone]  [id number] [address] [place] [address] Phone: [phone] Fax: [phone]     Social Determinants of Health (SDOH) Interventions    Readmission Risk Interventions No flowsheet data found.

## 2018-12-18 NOTE — Anesthesia Procedure Notes (Signed)
Procedure Name: Intubation Date/Time: 12/18/2018 5:11 PM Performed by: Barrington Ellison, CRNA Pre-anesthesia Checklist: Patient identified, Emergency Drugs available, Suction available and Patient being monitored Patient Re-evaluated:Patient Re-evaluated prior to induction Oxygen Delivery Method: Circle System Utilized Preoxygenation: Pre-oxygenation with 100% oxygen Induction Type: IV induction and Rapid sequence Laryngoscope Size: Mac and 3 Grade View: Grade I Tube type: Oral Tube size: 7.0 mm Number of attempts: 1 Airway Equipment and Method: Stylet and Oral airway Placement Confirmation: ETT inserted through vocal cords under direct vision,  positive ETCO2 and breath sounds checked- equal and bilateral Secured at: 20 cm Tube secured with: Tape Dental Injury: Teeth and Oropharynx as per pre-operative assessment

## 2018-12-18 NOTE — Progress Notes (Signed)
PROGRESS NOTE    Jill Mcdowell  OJJ:[id number] DOB: 28-Nov-1926 DOA: 12/15/2018 PCP: Leighton Ruff, MD   Brief Narrative: 85 y.o.femalewith medical history significant ofPVCs who presents to the emergency department on 12/15/2018 due to persistent severe back pain and was eventually diagnosed with T7 compression fracture and was transferred to [facility] for neurosurgery evaluation and possible kyphoplasty. Seen by neurosurgery 6/23 and planning for kyphoplasty.  Subjective: Patient resting comfortably has been getting morphine for back pain, awaiting for procedure.  Assessment & Plan:   Principal Problem:   Back pain Active Problems:   Paroxysmal atrial fibrillation (HCC)   PVC's (premature ventricular contractions)   Compression fracture of body of thoracic vertebra (HCC)  Back pain/T7 compression fracture:  Appreciate neurosurgery input plan for kyphoplasty today or tomorrow.  Continue IV and oral opiates for pain control.  History of brief episode of atrial fibrillation in the past: per patient this happened only once and she is not on any anticoagulation.  in NSR  History of PVCs: On Cardizem.  Hypothyroidism: Continue Synthroid   DVT prophylaxis: SCD-no chemical prophylaxis due to procedure Code Status: Full code Family Communication: Updated plan of care to the patient.  Disposition Plan: remains inpatient pending clinical improvement. tbd post kypho. Plan for resumption of HH on d/c   Consultants:  neuroSurgery Procedures:  Antimicrobials: Anti-infectives (From admission, onward)   None       Objective: Vitals:   12/17/18 1947 12/18/18 0038 12/18/18 0326 12/18/18 0741  BP: 112/62 139/72 (!) 146/75 (!) 89/76  Pulse: 94 85 89 92  Resp: 17 17 16 18   Temp: 98.2 F (36.8 C) 98 F (36.7 C) 97.9 F (36.6 C) 98.2 F (36.8 C)  TempSrc: Oral Oral Oral Oral  SpO2: 95% 92% 92% 94%  Weight:      Height:        Intake/Output Summary (Last  24 hours) at 12/18/2018 0827 Last data filed at 12/17/2018 1500 Gross per 24 hour  Intake 49.63 ml  Output --  Net 49.63 ml   Filed Weights   12/15/18 2257  Weight: 53.8 kg   Weight change:   Body mass index is 22.41 kg/m.  Intake/Output from previous day: 06/23 0701 - 06/24 0700 In: 49.6 [IV Piggyback:49.6] Out: -  Intake/Output this shift: No intake/output data recorded.  Examination:  General exam: Appears calm and comfortable,Not in distress, older fore the age HEENT:PERRL,Oral mucosa moist, Ear/Nose normal on gross exam Respiratory system: Bilateral equal air entry, normal vesicular breath sounds, no wheezes or crackles  Cardiovascular system: S1 & S2 heard,No JVD, murmurs. Gastrointestinal system: Abdomen is  soft, non tender, non distended, BS +  Nervous System:Alert and oriented. No focal neurological deficits/moving extremities, sensation intact. Extremities: No edema, no clubbing, distal peripheral pulses palpable.  Tenderness on the mid back Skin: No rashes, lesions, no icterus MSK: Normal muscle bulk,tone ,power  Medications:  Scheduled Meds:  diltiazem  180 mg Oral Daily   levothyroxine  50 mcg Oral QAC breakfast   pantoprazole  40 mg Oral Daily   polyethylene glycol  17 g Oral Daily   Continuous Infusions:  sodium chloride Stopped (12/16/18 1008)   methocarbamol (ROBAXIN) IV Stopped (12/16/18 0423)    Data Reviewed: I have personally reviewed following labs and imaging studies  CBC: Recent Labs  Lab 12/13/18 2347 12/15/18 1020 12/17/18 0715  WBC 8.1 10.0 10.3  NEUTROABS 5.8 8.6* 7.5  HGB 13.5 15.1* 14.0  HCT 43.0 46.8* 43.0  MCV  94.1 92.9 91.7  PLT 176 201 962   Basic Metabolic Panel: Recent Labs  Lab 12/13/18 2347 12/15/18 1020 12/17/18 0715  NA 140 137 137  K 3.9 3.8 3.8  CL 105 100 100  CO2 27 26 28   GLUCOSE 117* 127* 101*  BUN 13 10 14   CREATININE 0.71 0.61 0.79  CALCIUM 8.8* 9.2 8.8*   GFR: Estimated Creatinine  Clearance: 36 mL/min (by C-G formula based on SCr of 0.79 mg/dL). Liver Function Tests: Recent Labs  Lab 12/15/18 1020  AST 22  ALT 19  ALKPHOS 75  BILITOT 1.5*  PROT 7.7  ALBUMIN 4.5   No results for input(s): LIPASE, AMYLASE in the last 168 hours. No results for input(s): AMMONIA in the last 168 hours. Coagulation Profile: No results for input(s): INR, PROTIME in the last 168 hours. Cardiac Enzymes: No results for input(s): CKTOTAL, CKMB, CKMBINDEX, TROPONINI in the last 168 hours. BNP (last 3 results) No results for input(s): PROBNP in the last 8760 hours. HbA1C: No results for input(s): HGBA1C in the last 72 hours. CBG: No results for input(s): GLUCAP in the last 168 hours. Lipid Profile: No results for input(s): CHOL, HDL, LDLCALC, TRIG, CHOLHDL, LDLDIRECT in the last 72 hours. Thyroid Function Tests: No results for input(s): TSH, T4TOTAL, FREET4, T3FREE, THYROIDAB in the last 72 hours. Anemia Panel: No results for input(s): VITAMINB12, FOLATE, FERRITIN, TIBC, IRON, RETICCTPCT in the last 72 hours. Sepsis Labs: No results for input(s): PROCALCITON, LATICACIDVEN in the last 168 hours.  Recent Results (from the past 240 hour(s))  Urine culture     Status: Abnormal   Collection Time: 12/13/18 11:47 PM   Specimen: Urine, Random  Result Value Ref Range Status   Specimen Description   Final    URINE, RANDOM Performed at [facility] [address] [place], [place] [address]    Special Requests   Final    NONE Performed at [facility], [place] [address] [place], [place] [address]    Culture (A)  Final    <10,000 COLONIES/mL INSIGNIFICANT GROWTH Performed at [facility] [address], [place], [place] [address]    Report Status 12/15/2018 FINAL  Final  Urine culture     Status: None   Collection Time: 12/15/18 10:20 AM   Specimen: Urine, Random  Result Value Ref Range Status   Specimen Description   Final    URINE,  RANDOM Performed at [facility] [address], [place], [place] [address]    Special Requests   Final    NONE Performed at [facility], [place] [address] [place], [place] [address]    Culture   Final    NO GROWTH Performed at [facility], [place] [address], [place], [place] [address]    Report Status 12/16/2018 FINAL  Final  SARS Coronavirus 2 (CEPHEID - Performed in [facility] hospital lab), Hosp Order     Status: None   Collection Time: 12/15/18  1:33 PM   Specimen: Nasopharyngeal Swab  Result Value Ref Range Status   SARS Coronavirus 2 NEGATIVE NEGATIVE Final    Comment: (NOTE) If result is NEGATIVE SARS-CoV-2 target nucleic acids are NOT DETECTED. The SARS-CoV-2 RNA is generally detectable in upper and lower  respiratory specimens during the acute phase of infection. The lowest  concentration of SARS-CoV-2 viral copies this assay can detect is 250  copies / mL. A negative result does not preclude SARS-CoV-2 infection  and should not be used as the sole basis for treatment or other  patient management decisions.  A negative result may occur with  improper specimen collection / handling, submission of specimen other  than nasopharyngeal swab, presence of viral mutation(s) within the  areas targeted by this assay, and inadequate number of viral copies  (<250 copies / mL). A negative result must be combined with clinical  observations, patient history, and epidemiological information. If result is POSITIVE SARS-CoV-2 target nucleic acids are DETECTED. The SARS-CoV-2 RNA is generally detectable in upper and lower  respiratory specimens dur ing the acute phase of infection.  Positive  results are indicative of active infection with SARS-CoV-2.  Clinical  correlation with patient history and other diagnostic information is  necessary to determine patient infection status.  Positive results do  not rule out bacterial infection or  co-infection with other viruses. If result is PRESUMPTIVE POSTIVE SARS-CoV-2 nucleic acids MAY BE PRESENT.   A presumptive positive result was obtained on the submitted specimen  and confirmed on repeat testing.  While 2019 novel coronavirus  (SARS-CoV-2) nucleic acids may be present in the submitted sample  additional confirmatory testing may be necessary for epidemiological  and / or clinical management purposes  to differentiate between  SARS-CoV-2 and other Sarbecovirus currently known to infect humans.  If clinically indicated additional testing with an alternate test  methodology [phone]) is advised. The SARS-CoV-2 RNA is generally  detectable in upper and lower respiratory sp ecimens during the acute  phase of infection. The expected result is Negative. Fact Sheet for Patients:  StrictlyIdeas.no Fact Sheet for Healthcare Providers: BankingDealers.co.za This test is not yet approved or cleared by the [place] FDA and has been authorized for detection and/or diagnosis of SARS-CoV-2 by FDA under an Emergency Use Authorization (EUA).  This EUA will remain in effect (meaning this test can be used) for the duration of the COVID-19 declaration under Section 564(b)(1) of the Act, 21 U.S.C. section 360bbb-3(b)(1), unless the authorization is terminated or revoked sooner. Performed at [facility], [place] [address], [place], [place] [address]   Surgical pcr screen     Status: None   Collection Time: 12/17/18  9:58 PM   Specimen: Nasal Mucosa; Nasal Swab  Result Value Ref Range Status   MRSA, PCR NEGATIVE NEGATIVE Final   Staphylococcus aureus NEGATIVE NEGATIVE Final    Comment: (NOTE) The Xpert SA Assay (FDA approved for NASAL specimens in patients 73 years of age and older), is one component of a comprehensive surveillance program. It is not intended to diagnose infection nor to guide or monitor  treatment. Performed at [facility], [place] [address] [place], [place] [address]       Radiology Studies: Mr Thoracic Spine Wo Contrast  Result Date: 12/16/2018 CLINICAL DATA:  Initial evaluation for acute compression fracture. EXAM: MRI THORACIC SPINE WITHOUT CONTRAST TECHNIQUE: Multiplanar, multisequence MR imaging of the thoracic spine was performed. No intravenous contrast was administered. COMPARISON:  Prior CT from 12/15/2018. FINDINGS: Alignment: Vertebral bodies normally aligned with preservation of the normal thoracic kyphosis. No listhesis or subluxation. Vertebrae: Acute to subacute appearing compression fracture involving the T7 vertebral body with up to approximately 50% height loss. Associated 5 mm bony retropulsion at the inferior aspect of the posterior T7 vertebral body. Fracture is mechanical in appearance with no underlying pathologic lesion. Severe chronic compression deformity of the T10 vertebral body with up to 3 mm bony retropulsion. Additional chronic compression deformity  seen involving the superior endplate of L1 with up to approximately 35% height loss with trace 2 mm bony retropulsion. Vertebral body heights otherwise maintained with no other acute or chronic fracture identified. Underlying bone marrow signal intensity within normal limits. No discrete or worrisome osseous lesions. No other abnormal marrow edema. Cord: Signal intensity within the thoracic spinal cord is normal. Normal cord caliber and morphology. Paraspinal and other soft tissues: Paraspinous edema seen adjacent to the T7 compression fracture. Paraspinous soft tissues demonstrate no other acute finding. Trace layering bilateral pleural effusions noted. 4.5 cm simple left renal cyst partially visualized. Visualized visceral structures otherwise unremarkable. Disc levels: T1-2:  Unremarkable. T2-3: Unremarkable. T3-4:  Unremarkable. T4-5:  Unremarkable. T5-6:  Unremarkable. T6-7:  Unremarkable. T7-8: 5 mm  bony retropulsion related to the T7 compression fracture. Superimposed posterior element hypertrophy. Resultant mild spinal stenosis without cord impingement. Mild bilateral foraminal narrowing, right slightly worse than left. T8-9:  Unremarkable. T9-10: Mild disc bulging with 3 mm bony retropulsion related to the T10 compression fracture. Flattening and partial effacement of the ventral thecal sac with resultant mild spinal stenosis. Foramina remain patent. T10-11: 3 mm bony retropulsion related to the T10 compression fracture. Mild facet hypertrophy. Resultant mild spinal stenosis without cord impingement. Foramina remain patent. T11-12:  Mild facet hypertrophy.  No stenosis. T12-L1: Minimal disc bulge with bilateral facet hypertrophy. No significant stenosis. IMPRESSION: 1. Acute to early subacute compression fracture of T7 with up to 50% height loss and 5 mm bony retropulsion. Resultant mild spinal stenosis without cord impingement. 2. Additional chronic compression deformities involving the T10 and L1 vertebral bodies as above. 3. Trace layering bilateral pleural effusions. Electronically Signed   By: Jeannine Boga M.D.   On: 12/16/2018 19:05      LOS: 3 days   Time spent: More than 50% of that time was spent in counseling and/or coordination of care.  Antonieta Pert, MD [facility]  12/18/2018, 8:27 AM

## 2018-12-18 NOTE — Care Management Important Message (Signed)
Important Message  Patient Details  Name: Jill Mcdowell MRN: [id number] Date of Birth: 1926/09/08   Medicare Important Message Given:  Yes     Orbie Pyo 12/18/2018, 2:15 PM

## 2018-12-18 NOTE — Anesthesia Preprocedure Evaluation (Signed)
Anesthesia Evaluation  Patient identified by MRN, date of birth, ID band Patient awake    Reviewed: Allergy & Precautions, NPO status   Airway Mallampati: II  TM Distance: >3 FB     Dental   Pulmonary former smoker,    breath sounds clear to auscultation       Cardiovascular hypertension,  Rhythm:Regular Rate:Normal     Neuro/Psych    GI/Hepatic Neg liver ROS, GERD  ,  Endo/Other    Renal/GU Renal disease     Musculoskeletal   Abdominal   Peds  Hematology   Anesthesia Other Findings   Reproductive/Obstetrics                             Anesthesia Physical Anesthesia Plan  ASA: III  Anesthesia Plan: General   Post-op Pain Management:    Induction: Intravenous  PONV Risk Score and Plan: 3 and Treatment may vary due to age or medical condition  Airway Management Planned: Oral ETT  Additional Equipment:   Intra-op Plan:   Post-operative Plan: Possible Post-op intubation/ventilation  Informed Consent: I have reviewed the patients History and Physical, chart, labs and discussed the procedure including the risks, benefits and alternatives for the proposed anesthesia with the patient or authorized representative who has indicated his/her understanding and acceptance.     Dental advisory given  Plan Discussed with: Anesthesiologist and CRNA  Anesthesia Plan Comments:         Anesthesia Quick Evaluation

## 2018-12-18 NOTE — Progress Notes (Signed)
As per the patient's request I spoke with her son and answered all his questions.

## 2018-12-18 NOTE — Progress Notes (Signed)
Subjective: The patient is somnolent but arousable.  She is in no apparent distress.  She looks well.  Objective: Vital signs in last 24 hours: Temp:  [97.9 F (36.6 C)-98.2 F (36.8 C)] 98.2 F (36.8 C) (06/24 1129) Pulse Rate:  [83-94] 83 (06/24 1129) Resp:  [16-18] 18 (06/24 1129) BP: (89-146)/(62-76) 127/66 (06/24 1129) SpO2:  [92 %-95 %] 95 % (06/24 1129) Weight:  [53.8 kg] 53.8 kg (06/24 1519) Estimated body mass index is 22.42 kg/m as calculated from the following:   Height as of this encounter: 5' 0.98" (1.549 m).   Weight as of this encounter: 53.8 kg.   Intake/Output from previous day: 06/23 0701 - 06/24 0700 In: 49.6 [IV Piggyback:49.6] Out: -  Intake/Output this shift: No intake/output data recorded.  Physical exam the patient is somnolent but arousable.  She is moving her lower extremities well.  Lab Results: Recent Labs    12/17/18 0715  WBC 10.3  HGB 14.0  HCT 43.0  PLT 194   BMET Recent Labs    12/17/18 0715  NA 137  K 3.8  CL 100  CO2 28  GLUCOSE 101*  BUN 14  CREATININE 0.79  CALCIUM 8.8*    Studies/Results: No results found.  Assessment/Plan: The patient is doing well.  I spoke with her son.  LOS: 3 days     Jill Mcdowell 12/18/2018, 6:17 PM

## 2018-12-18 NOTE — Progress Notes (Signed)
Subjective: The patient is alert and pleasant.  She continues to complain of midthoracic pain.  She does not feel she has improved.  Objective: Vital signs in last 24 hours: Temp:  [97.9 F (36.6 C)-98.2 F (36.8 C)] 98.2 F (36.8 C) (06/24 1129) Pulse Rate:  [83-94] 83 (06/24 1129) Resp:  [16-18] 18 (06/24 1129) BP: (89-146)/(62-76) 127/66 (06/24 1129) SpO2:  [92 %-95 %] 95 % (06/24 1129) Weight:  [53.8 kg] 53.8 kg (06/24 1519) Estimated body mass index is 22.42 kg/m as calculated from the following:   Height as of this encounter: 5' 0.98" (1.549 m).   Weight as of this encounter: 53.8 kg.   Intake/Output from previous day: 06/23 0701 - 06/24 0700 In: 49.6 [IV Piggyback:49.6] Out: -  Intake/Output this shift: No intake/output data recorded.  Physical exam patient is alert and oriented.  She is moving her lower extremities well.  Lab Results: Recent Labs    12/17/18 0715  WBC 10.3  HGB 14.0  HCT 43.0  PLT 194   BMET Recent Labs    12/17/18 0715  NA 137  K 3.8  CL 100  CO2 28  GLUCOSE 101*  BUN 14  CREATININE 0.79  CALCIUM 8.8*    Studies/Results: Mr Thoracic Spine Wo Contrast  Result Date: 12/16/2018 CLINICAL DATA:  Initial evaluation for acute compression fracture. EXAM: MRI THORACIC SPINE WITHOUT CONTRAST TECHNIQUE: Multiplanar, multisequence MR imaging of the thoracic spine was performed. No intravenous contrast was administered. COMPARISON:  Prior CT from 12/15/2018. FINDINGS: Alignment: Vertebral bodies normally aligned with preservation of the normal thoracic kyphosis. No listhesis or subluxation. Vertebrae: Acute to subacute appearing compression fracture involving the T7 vertebral body with up to approximately 50% height loss. Associated 5 mm bony retropulsion at the inferior aspect of the posterior T7 vertebral body. Fracture is mechanical in appearance with no underlying pathologic lesion. Severe chronic compression deformity of the T10 vertebral body  with up to 3 mm bony retropulsion. Additional chronic compression deformity seen involving the superior endplate of L1 with up to approximately 35% height loss with trace 2 mm bony retropulsion. Vertebral body heights otherwise maintained with no other acute or chronic fracture identified. Underlying bone marrow signal intensity within normal limits. No discrete or worrisome osseous lesions. No other abnormal marrow edema. Cord: Signal intensity within the thoracic spinal cord is normal. Normal cord caliber and morphology. Paraspinal and other soft tissues: Paraspinous edema seen adjacent to the T7 compression fracture. Paraspinous soft tissues demonstrate no other acute finding. Trace layering bilateral pleural effusions noted. 4.5 cm simple left renal cyst partially visualized. Visualized visceral structures otherwise unremarkable. Disc levels: T1-2:  Unremarkable. T2-3: Unremarkable. T3-4:  Unremarkable. T4-5:  Unremarkable. T5-6:  Unremarkable. T6-7:  Unremarkable. T7-8: 5 mm bony retropulsion related to the T7 compression fracture. Superimposed posterior element hypertrophy. Resultant mild spinal stenosis without cord impingement. Mild bilateral foraminal narrowing, right slightly worse than left. T8-9:  Unremarkable. T9-10: Mild disc bulging with 3 mm bony retropulsion related to the T10 compression fracture. Flattening and partial effacement of the ventral thecal sac with resultant mild spinal stenosis. Foramina remain patent. T10-11: 3 mm bony retropulsion related to the T10 compression fracture. Mild facet hypertrophy. Resultant mild spinal stenosis without cord impingement. Foramina remain patent. T11-12:  Mild facet hypertrophy.  No stenosis. T12-L1: Minimal disc bulge with bilateral facet hypertrophy. No significant stenosis. IMPRESSION: 1. Acute to early subacute compression fracture of T7 with up to 50% height loss and 5 mm bony retropulsion. Resultant  mild spinal stenosis without cord impingement. 2.  Additional chronic compression deformities involving the T10 and L1 vertebral bodies as above. 3. Trace layering bilateral pleural effusions. Electronically Signed   By: Jeannine Boga M.D.   On: 12/16/2018 19:05    Assessment/Plan: T7 fracture, thoracic spine pain: I have discussed the situation with the patient and her son.  We discussed the various treatment options including kyphoplasty.  I have answered all her questions.  We have discussed the risks, benefits, expected postoperative course, likelihood of achieving our goals with surgery and alternatives surgery and decided proceed with a T7 kyphoplasty.  LOS: 3 days     Ophelia Charter 12/18/2018, 4:10 PM

## 2018-12-18 NOTE — Progress Notes (Signed)
Physical Therapy Treatment Patient Details Name: Jill Mcdowell MRN: [id number] DOB: Oct 23, 1926 Today's Date: 12/18/2018    History of Present Illness Patient presenting to the ED on 12/15/18 with primary complaints of severe back pain. Diagnosed with T7 compression fracture - awaiting neurosurgery consult per chart. past medical history significant of PVCs.     PT Comments    Patient seen to progress mobility. Patient tolerable to OOB mobility. Mild instability with patient preferring to hold PT hip - discussed possibility of needing RW at discharge. PT to continue to progress safe and independent functional mobility.    Follow Up Recommendations  Home health PT;Supervision - Intermittent     Equipment Recommendations  Rolling walker with 5" wheels    Recommendations for Other Services       Precautions / Restrictions Precautions Precautions: Fall;Back Precaution Comments: discussed back precautions of no bending, arching, and twisting until seen by MD with good understanding Restrictions Weight Bearing Restrictions: No    Mobility  Bed Mobility Overal bed mobility: Needs Assistance Bed Mobility: Supine to Sit;Sit to Supine     Supine to sit: Supervision Sit to supine: Supervision   General bed mobility comments: for safety - no assist  Transfers Overall transfer level: Needs assistance Equipment used: 1 person hand held assist Transfers: Sit to/from Stand Sit to Stand: Min guard         General transfer comment: for initial steadying   Ambulation/Gait Ambulation/Gait assistance: Min guard Gait Distance (Feet): 250 Feet Assistive device: 1 person hand held assist Gait Pattern/deviations: Step-through pattern;Decreased stride length;Trunk flexed Gait velocity: decreased   General Gait Details: slow steady pace of gait; no LOB   Stairs             Wheelchair Mobility    Modified Rankin (Stroke Patients Only)       Balance Overall balance  assessment: Mild deficits observed, not formally tested                                          Cognition Arousal/Alertness: Awake/alert Behavior During Therapy: WFL for tasks assessed/performed Overall Cognitive Status: Within Functional Limits for tasks assessed                                        Exercises      General Comments        Pertinent Vitals/Pain Pain Assessment: Faces Faces Pain Scale: Hurts a little bit Pain Location: back Pain Descriptors / Indicators: Aching;Discomfort;Guarding;Restless Pain Intervention(s): Limited activity within patient's tolerance;Monitored during session;Repositioned    Home Living                      Prior Function            PT Goals (current goals can now be found in the care plan section) Acute Rehab PT Goals Patient Stated Goal: reduce pain PT Goal Formulation: With patient Time For Goal Achievement: 12/30/18 Potential to Achieve Goals: Good Progress towards PT goals: Progressing toward goals    Frequency    Min 5X/week      PT Plan Current plan remains appropriate    Co-evaluation              AM-PAC PT "6 Clicks" Mobility   Outcome Measure  Help needed turning from your back to your side while in a flat bed without using bedrails?: A Little Help needed moving from lying on your back to sitting on the side of a flat bed without using bedrails?: A Little Help needed moving to and from a bed to a chair (including a wheelchair)?: A Little Help needed standing up from a chair using your arms (e.g., wheelchair or bedside chair)?: A Little Help needed to walk in hospital room?: A Little Help needed climbing 3-5 steps with a railing? : A Lot 6 Click Score: 17    End of Session Equipment Utilized During Treatment: Gait belt Activity Tolerance: Patient tolerated treatment well Patient left: in bed;with call bell/phone within reach Nurse Communication: Mobility  status PT Visit Diagnosis: Unsteadiness on feet (R26.81);Other abnormalities of gait and mobility (R26.89);Muscle weakness (generalized) (M62.81);Pain Pain - part of body: (back)     Time: 4970-2637 PT Time Calculation (min) (ACUTE ONLY): 20 min  Charges:  $Gait Training: 8-22 mins                      Lanney Gins, PT, DPT Supplemental Physical Therapist 12/18/18 2:09 PM Pager: [phone] Office: [phone]

## 2018-12-19 ENCOUNTER — Encounter (HOSPITAL_COMMUNITY): Payer: Self-pay | Admitting: Neurosurgery

## 2018-12-19 NOTE — TOC Transition Note (Signed)
Transition of Care [facility]) - CM/SW Discharge Note   Patient Details  Name: Jill Mcdowell MRN: [id number] Date of Birth: [birth year]  [facility] CM/SW Contact:  Pollie Friar, RN Phone Number: 12/19/2018, 11:37 AM   Clinical Narrative:    [facility] had previously accepted her for [facility]. CM called and notified them of her d/c.  Walker delivered to the room.  Pt has transport home.   Final next level of care: [place] Barriers to Discharge: No Barriers Identified   Patient Goals and CMS Choice Patient states their goals for this hospitalization and ongoing recovery are:: to return home with home health CMS Medicare.gov Compare Post Acute Care list provided to:: Patient Choice offered to / list presented to : Patient  Discharge Placement                       Discharge Plan and Services In-house Referral: Clinical Social Work Discharge Planning Services: CM Consult Post Acute Care Choice: Home Health          DME Arranged: Walker rolling DME Agency: [employer]       HH Arranged: PT, OT, Social Work [employer] Agency: [facility] Date [place]: 12/19/18 Time [place]: 1050    Social Determinants of Health (SDOH) Interventions     Readmission Risk Interventions No flowsheet data found.

## 2018-12-19 NOTE — Anesthesia Postprocedure Evaluation (Signed)
Anesthesia Post Note  Patient: Jill Mcdowell  Procedure(s) Performed: KYPHOPLASTY, THORACIC SEVEN (N/A Spine Thoracic)     Patient location during evaluation: PACU Anesthesia Type: General Level of consciousness: awake Pain management: pain level controlled Vital Signs Assessment: post-procedure vital signs reviewed and stable Cardiovascular status: stable Postop Assessment: no apparent nausea or vomiting Anesthetic complications: no    Last Vitals:  Vitals:   12/19/18 0422 12/19/18 0754  BP: 125/60 135/67  Pulse: (!) 58 60  Resp: 18 18  Temp: 36.4 C 36.5 C  SpO2: 96% 98%    Last Pain:  Vitals:   12/19/18 0800  TempSrc:   PainSc: 0-No pain                 Willow Shidler

## 2018-12-19 NOTE — Evaluation (Signed)
Occupational Therapy Evaluation Patient Details Name: Jill Mcdowell MRN: [id number] DOB: Feb 14, 1927 Today's Date: 12/19/2018    History of Present Illness Patient presenting to the ED on 12/15/18 with primary complaints of severe back pain. Diagnosed with T7 compression fracture s/p T7 kyphoplasty  past medical history significant of PVCs.    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 Pt currently performing ADL functional transfers and mobility with Rw and fair balance and supervisionA. Pt performing LB dressing with figure four technique for ADL tasks. Pt performing UB ADL with set-upA and LB ADL with set-upA. Pt tolerating session well. Pt education provided for AE, but pt does not require it at this time. Pt able to adhere to back precautions at this time. Pt would benefit from continued OT skilled services for ADL, mobility and safety in [place] setting. OT following acutely.    Follow Up Recommendations  Home health OT;Supervision - Intermittent    Equipment Recommendations  None recommended by OT    Recommendations for Other Services       Precautions / Restrictions Precautions Precautions: Fall;Back Precaution Booklet Issued: Yes (comment) Precaution Comments: discussed back precautions of no bending, arching, and twisting set by MD Restrictions Weight Bearing Restrictions: No      Mobility Bed Mobility Overal bed mobility: Modified Independent Bed Mobility: Supine to Sit;Sit to Supine     Supine to sit: Supervision Sit to supine: Supervision   General bed mobility comments: for safety - no assist  Transfers Overall transfer level: Needs assistance Equipment used: None Transfers: Sit to/from Stand Sit to Stand: Supervision         General transfer comment: for safety    Balance Overall balance assessment: Modified Independent                                         ADL either  performed or assessed with clinical judgement   ADL Overall ADL's : Needs assistance/impaired Eating/Feeding: Modified independent   Grooming: Supervision/safety;Standing   Upper Body Bathing: Supervision/ safety;Set up;Standing   Lower Body Bathing: Supervison/ safety;Adhering to back precautions;Sitting/lateral leans;Sit to/from stand   Upper Body Dressing : Set up;Sitting   Lower Body Dressing: Supervision/safety;Sitting/lateral leans;Sit to/from stand;Adhering to back precautions   Toilet Transfer: Supervision/safety;RW   Toileting- Clothing Manipulation and Hygiene: Supervision/safety;Sitting/lateral lean;Sit to/from stand;Adhering to back precautions       Functional mobility during ADLs: Supervision/safety;Rolling walker General ADL Comments: set-upA for RW and using figure 4 technique for ADL     Vision Baseline Vision/History: Wears glasses Wears Glasses: At all times Patient Visual Report: No change from baseline Vision Assessment?: No apparent visual deficits     Perception     Praxis      Pertinent Vitals/Pain Pain Assessment: 0-10 Pain Score: 2  Pain Location: back Pain Descriptors / Indicators: Sore Pain Intervention(s): Limited activity within patient's tolerance;Monitored during session     Hand Dominance Right   Extremity/Trunk Assessment Upper Extremity Assessment Upper Extremity Assessment: Overall WFL for tasks assessed   Lower Extremity Assessment Lower Extremity Assessment: Overall WFL for tasks assessed   Cervical / Trunk Assessment Cervical / Trunk Assessment: Kyphotic   Communication Communication Communication: No difficulties   Cognition Arousal/Alertness: Awake/alert Behavior During Therapy: WFL for tasks assessed/performed Overall Cognitive Status: Within Functional Limits for tasks assessed  General Comments  Pt given education for pain management, back precautions and ADL  training. Pt was not in need of LB ADL AE at this time.     Exercises     Shoulder Instructions      Home Living Family/patient expects to be discharged to:: Private residence Living Arrangements: Spouse/significant other Available Help at Discharge: Family;Available PRN/intermittently Type of Home: House Home Access: Stairs to enter [employer] of Steps: 2 Entrance Stairs-Rails: Left Home Layout: One level;Laundry or work area in basement     [employer]: Occupational psychologist: [place]: Shower seat;Grab bars - tub/shower;Bedside commode   Additional Comments: uses chair lift to go downstairs      Prior Functioning/Environment Level of Independence: Independent        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        OT Problem List: Decreased activity tolerance;Impaired balance (sitting and/or standing);Decreased safety awareness      OT Treatment/Interventions: Self-care/ADL training;Therapeutic exercise;Therapeutic activities;Energy conservation;DME and/or AE instruction;Patient/family education;Modalities;Balance training;Manual therapy    OT Goals(Current goals can be found in the care plan section) Acute Rehab OT Goals Patient Stated Goal: reduce pain OT Goal Formulation: With patient Time For Goal Achievement: 12/30/18 Potential to Achieve Goals: Good  OT Frequency: Min 2X/week   Barriers to D/C:            Co-evaluation              AM-PAC OT "6 Clicks" Daily Activity     Outcome Measure Help from another person eating meals?: None Help from another person taking care of personal grooming?: None Help from another person toileting, which includes using toliet, bedpan, or urinal?: None Help from another person bathing (including washing, rinsing, drying)?: None Help from another person to put on and taking off regular upper body clothing?: None Help from another person to put on and taking off regular  lower body clothing?: None 6 Click Score: 24   End of Session Equipment Utilized During Treatment: Gait belt Nurse Communication: Patient requests pain meds  Activity Tolerance: Patient limited by pain Patient left: in bed;with call bell/phone within reach;with bed alarm set  OT Visit Diagnosis: Muscle weakness (generalized) (M62.81);Unsteadiness on feet (R26.81);Pain Pain - part of body: (back)                Time: 8413-2440 OT Time Calculation (min): 21 min Charges:  OT General Charges $OT Visit: 1 Visit OT Evaluation $OT Re-eval: 1 Re-eval  Darryl Nestle) Marsa Aris OTR/L Acute Rehabilitation Services Pager: [phone] Office: [phone]   Jenene Slicker Miryah Ralls 12/19/2018, 10:07 AM

## 2018-12-19 NOTE — Progress Notes (Signed)
Subjective: The patient is alert and pleasant.  Her back pain is "better".  Objective: Vital signs in last 24 hours: Temp:  [97.6 F (36.4 C)-98.9 F (37.2 C)] 97.7 F (36.5 C) (06/25 0754) Pulse Rate:  [58-86] 60 (06/25 0754) Resp:  [13-18] 18 (06/25 0754) BP: (119-155)/(51-76) 135/67 (06/25 0754) SpO2:  [90 %-98 %] 98 % (06/25 0754) Weight:  [53.8 kg] 53.8 kg (06/24 1519) Estimated body mass index is 22.42 kg/m as calculated from the following:   Height as of this encounter: 5' 0.98" (1.549 m).   Weight as of this encounter: 53.8 kg.   Intake/Output from previous day: 06/24 0701 - 06/25 0700 In: 566 [P.O.:360; I.V.:6; IV Piggyback:200] Out: 660 [Urine:650; Blood:10] Intake/Output this shift: No intake/output data recorded.  Physical exam the patient is alert and oriented.  She is moving her lower extremities well.  Lab Results: Recent Labs    12/17/18 0715  WBC 10.3  HGB 14.0  HCT 43.0  PLT 194   BMET Recent Labs    12/17/18 0715  NA 137  K 3.8  CL 100  CO2 28  GLUCOSE 101*  BUN 14  CREATININE 0.79  CALCIUM 8.8*    Studies/Results: Dg Lumbar Spine 2-3 Views  Result Date: 12/18/2018 CLINICAL DATA:  T7 kyphoplasty. EXAM: DG C-ARM 61-120 MIN; LUMBAR SPINE - 2-3 VIEW COMPARISON:  CT 12/15/2018 FINDINGS: Two fluoroscopic images demonstrate bone cement within a vertebral body and presumably this is T7. There is a small amount of cement extending posterior to the vertebral body, presumably within the pedicle. There is no significant cement superior, inferior or anterior to the vertebral body. IMPRESSION: Fluoroscopic images of the T7 kyphoplasty. Electronically Signed   By: Markus Daft M.D.   On: 12/18/2018 19:49   Dg C-arm 1-60 Min  Result Date: 12/18/2018 CLINICAL DATA:  T7 kyphoplasty. EXAM: DG C-ARM 61-120 MIN; LUMBAR SPINE - 2-3 VIEW COMPARISON:  CT 12/15/2018 FINDINGS: Two fluoroscopic images demonstrate bone cement within a vertebral body and presumably  this is T7. There is a small amount of cement extending posterior to the vertebral body, presumably within the pedicle. There is no significant cement superior, inferior or anterior to the vertebral body. IMPRESSION: Fluoroscopic images of the T7 kyphoplasty. Electronically Signed   By: Markus Daft M.D.   On: 12/18/2018 19:49    Assessment/Plan: Postop day #1 status post T7 kyphoplasty: The patient is doing better.  I encouraged her to mobilize.  She can be discharged to home from my point of view and follow-up with me in the office in a few weeks.  LOS: 4 days     Ophelia Charter 12/19/2018, 8:13 AM

## 2018-12-19 NOTE — Discharge Summary (Signed)
Physician Discharge Summary  Jill Mcdowell HKV:[id number] DOB: February 09, 1927 DOA: 12/15/2018  PCP: Leighton Ruff, MD  Admit date: 12/15/2018 Discharge date: 12/19/2018  Admitted From: home Disposition:  [place]  Recommendations for Outpatient Follow-up:  1. Follow up with PCP in 1-2 weeks 2. Follow-up with Dr. Newman Pies in few weeks 3. Please obtain BMP/CBC in one week 4. Please follow up on the following pending results:  Home Health: YES  Equipment/Devices: rw  Discharge Condition:stable  CODE STATUS: Full code Diet recommendation: Cardiac diet  Brief/Interim Summary: 85 y.o.femalewith medical history significant ofPVCs who presents to the emergency departmenton 6/21/2020due to persistent severe back painand was eventually diagnosed with T7 compression fracture and was transferred to [facility] for neurosurgery evaluation and possible kyphoplasty. Seen by neurosurgery 6/23 and planning for kyphoplasty. Underwent kyphoplasty 6/24. Pain is controlled not needing any opiates, on Tylenol.  Seen by neurosurgery, she ambulated with nursing staff this morning and did well.  She is interested in going to home with home health with husband.  She does not want to go to skilled nursing facility.  Discharge Diagnoses:   Back pain/T7 compression fracture:Appreciate neurosurgery input s/p kyphoplasty 6/24. Pain controlled on Oral Tylenol.  History of brief episode of atrial fibrillation in the past:per patient this happened only once and she is not on any anticoagulation. In NSR.  History of PVCs:On Cardizem.  Hypothyroidism:Continue Synthroid  DVT prophylaxis: SCD-no chemical prophylaxis due to procedure Code Status: Full code Family Communication: Updated plan of care to the patient.  Disposition Plan:  Home with home health. Procedure: kyphoplasty Discharge Instructions  Discharge Instructions    Diet - low sodium heart healthy   Complete by: As directed     Discharge instructions   Complete by: As directed    Please call call MD or return to ER for similar or worsening recurring problem that brought you to hospital or if any fever,nausea/vomiting,abdominal pain, uncontrolled pain, chest pain,  shortness of breath or any other alarming symptoms. Please follow-up your doctor as instructed in a week time and call the office for appointment.  Follow-up with Dr. Newman Pies in few weeks  Please avoid alcohol, smoking, or any other illicit substance and maintain healthy habits including taking your regular medications as prescribed.  You were cared for by a hospitalist during your hospital stay. If you have any questions about your discharge medications or the care you received while you were in the hospital after you are discharged, you can call the unit and asked to speak with the hospitalist on call if the hospitalist that took care of you is not available. Once you are discharged, your primary care physician will handle any further medical issues. Please note that NO REFILLS for any discharge medications will be authorized once you are discharged, as it is imperative that you return to your primary care physician (or establish a relationship with a primary care physician if you do not have one) for your aftercare needs so that they can reassess your need for medications and monitor your lab values   Increase activity slowly   Complete by: As directed      Allergies as of 12/19/2018      Reactions   Latex Other (See Comments)   Skin irritation   Metoprolol Other (See Comments)   hallucinations   Sulfa Antibiotics Hives      Medication List    TAKE these medications   acetaminophen 500 MG tablet Commonly known as: TYLENOL Take  500 mg by mouth every 8 (eight) hours as needed for headache.   Dilt-XR 180 MG 24 hr capsule Generic drug: diltiazem TAKE 1 CAPSULE EVERY DAY What changed: how much to take   HYDROcodone-acetaminophen 5-325 MG  tablet Commonly known as: NORCO/VICODIN Take 1 tablet by mouth every 6 (six) hours as needed for moderate pain.   levothyroxine 50 MCG tablet Commonly known as: SYNTHROID Take 50 mcg by mouth daily before breakfast.   Lumify 0.025 % Soln Generic drug: Brimonidine Tartrate Apply 1 drop to eye daily.   Nu-Iron 150 MG capsule Generic drug: iron polysaccharides Take 150 mg by mouth 2 (two) times a week.   omeprazole 20 MG capsule Commonly known as: PRILOSEC Take 20 mg by mouth daily with lunch.   polyethylene glycol powder 17 GM/SCOOP powder Commonly known as: GLYCOLAX/MIRALAX Take 17 g by mouth daily.   PRESERVISION AREDS PO Take 1 capsule by mouth 2 (two) times daily.   tiZANidine 2 MG tablet Commonly known as: ZANAFLEX Take 2 mg by mouth every 8 (eight) hours as needed for muscle spasms.   Tums 500 MG chewable tablet Generic drug: calcium carbonate Chew 3 tablets by mouth daily as needed for indigestion or heartburn.   Vitamin D-3 125 MCG (5000 UT) Tabs Take 10,000 Units by mouth every Friday.            Durable Medical Equipment  (From admission, onward)         Start     Ordered   12/19/18 0856  For home use only DME Walker rolling  [facility])  Once    Question:  Patient needs a walker to treat with the following condition  Answer:  Vertebral fracture, osteoporotic ([place])   12/19/18 0857          Allergies  Allergen Reactions  . Latex Other (See Comments)    Skin irritation  . Metoprolol Other (See Comments)    hallucinations  . Sulfa Antibiotics Hives    Consultations: Neurosurgery  Procedures/Studies: Dg Chest 2 View  Result Date: 12/14/2018 CLINICAL DATA:  Shortness of breath. EXAM: CHEST - 2 VIEW COMPARISON:  PA and lateral chest 09/19/2015. FINDINGS: The lungs are clear. No pneumothorax or pleural effusion. Heart size is normal. Aortic atherosclerosis is noted. No acute bony abnormality. Remote T10, T12 and L1 compression fractures noted.  IMPRESSION: No acute disease. Electronically Signed   By: Inge Rise M.D.   On: 12/14/2018 00:52   Dg Lumbar Spine 2-3 Views  Result Date: 12/18/2018 CLINICAL DATA:  T7 kyphoplasty. EXAM: DG C-ARM 61-120 MIN; LUMBAR SPINE - 2-3 VIEW COMPARISON:  CT 12/15/2018 FINDINGS: Two fluoroscopic images demonstrate bone cement within a vertebral body and presumably this is T7. There is a small amount of cement extending posterior to the vertebral body, presumably within the pedicle. There is no significant cement superior, inferior or anterior to the vertebral body. IMPRESSION: Fluoroscopic images of the T7 kyphoplasty. Electronically Signed   By: Markus Daft M.D.   On: 12/18/2018 19:49   Ct Thoracic Spine Wo Contrast  Result Date: 12/15/2018 CLINICAL DATA:  Severe back pain since Monday. Injured while trying to help husband from falling. EXAM: CT THORACIC SPINE WITHOUT CONTRAST TECHNIQUE: Multidetector CT images of the thoracic were obtained using the standard protocol without intravenous contrast. COMPARISON:  MRI thoracic spine 09/19/2015 FINDINGS: Alignment: Exaggerated thoracic kyphosis but the vertebral bodies are normally aligned. Vertebrae: There is a remote T10 compression fracture, markedly progressive since 2017 with vertebral plana  appearance. Mild retropulsion but no significant canal compromise. Acute appearing severe compression fracture of T7 with minimal retropulsion but no canal compromise. Compression is estimated at 60% and there is paraspinal hematoma. Compression fracture of L1 involving the superior endplate. This was not present on prior lumbar radiographs from 02/18/2018. I do not see any obvious paraspinal hematoma and I do not think this is an acute fracture. Paraspinal and other soft tissues: Paraspinal hematoma noted at T7. There are small bilateral pleural effusions and bibasilar atelectasis. Aortic calcifications without definite aneurysm. The visualized posterior ribs are intact.  Disc levels: Minimal retropulsion at T7 mainly involving the inferior aspect of the vertebral body. There also appears to be a faintly calcified disc protrusion but the spinal canal is generous in is no significant spinal stenosis. Mild retropulsion at T10 with mild canal encroachment and mild spinal stenosis. Mild retropulsion of the posterosuperior aspect of L1 with mild canal encroachment but no significant stenosis. No spinal canal hematoma IMPRESSION: 1. Acute appearing and severe compression fracture of T7 with paraspinal hematoma. No significant retropulsion or canal compromise. 2. Remote but progressive T10 compression fracture. No significant change since lumbar radiographs from 2019. Mild retropulsion and mild canal compromise. 3. Indeterminate age L1 superior endplate fracture with mild retropulsion. 4. MRI may be helpful for further evaluation. Electronically Signed   By: Marijo Sanes M.D.   On: 12/15/2018 11:33   Mr Thoracic Spine Wo Contrast  Result Date: 12/16/2018 CLINICAL DATA:  Initial evaluation for acute compression fracture. EXAM: MRI THORACIC SPINE WITHOUT CONTRAST TECHNIQUE: Multiplanar, multisequence MR imaging of the thoracic spine was performed. No intravenous contrast was administered. COMPARISON:  Prior CT from 12/15/2018. FINDINGS: Alignment: Vertebral bodies normally aligned with preservation of the normal thoracic kyphosis. No listhesis or subluxation. Vertebrae: Acute to subacute appearing compression fracture involving the T7 vertebral body with up to approximately 50% height loss. Associated 5 mm bony retropulsion at the inferior aspect of the posterior T7 vertebral body. Fracture is mechanical in appearance with no underlying pathologic lesion. Severe chronic compression deformity of the T10 vertebral body with up to 3 mm bony retropulsion. Additional chronic compression deformity seen involving the superior endplate of L1 with up to approximately 35% height loss with trace 2  mm bony retropulsion. Vertebral body heights otherwise maintained with no other acute or chronic fracture identified. Underlying bone marrow signal intensity within normal limits. No discrete or worrisome osseous lesions. No other abnormal marrow edema. Cord: Signal intensity within the thoracic spinal cord is normal. Normal cord caliber and morphology. Paraspinal and other soft tissues: Paraspinous edema seen adjacent to the T7 compression fracture. Paraspinous soft tissues demonstrate no other acute finding. Trace layering bilateral pleural effusions noted. 4.5 cm simple left renal cyst partially visualized. Visualized visceral structures otherwise unremarkable. Disc levels: T1-2:  Unremarkable. T2-3: Unremarkable. T3-4:  Unremarkable. T4-5:  Unremarkable. T5-6:  Unremarkable. T6-7:  Unremarkable. T7-8: 5 mm bony retropulsion related to the T7 compression fracture. Superimposed posterior element hypertrophy. Resultant mild spinal stenosis without cord impingement. Mild bilateral foraminal narrowing, right slightly worse than left. T8-9:  Unremarkable. T9-10: Mild disc bulging with 3 mm bony retropulsion related to the T10 compression fracture. Flattening and partial effacement of the ventral thecal sac with resultant mild spinal stenosis. Foramina remain patent. T10-11: 3 mm bony retropulsion related to the T10 compression fracture. Mild facet hypertrophy. Resultant mild spinal stenosis without cord impingement. Foramina remain patent. T11-12:  Mild facet hypertrophy.  No stenosis. T12-L1: Minimal disc bulge with  bilateral facet hypertrophy. No significant stenosis. IMPRESSION: 1. Acute to early subacute compression fracture of T7 with up to 50% height loss and 5 mm bony retropulsion. Resultant mild spinal stenosis without cord impingement. 2. Additional chronic compression deformities involving the T10 and L1 vertebral bodies as above. 3. Trace layering bilateral pleural effusions. Electronically Signed   By:  Jeannine Boga M.D.   On: 12/16/2018 19:05   Dg C-arm 1-60 Min  Result Date: 12/18/2018 CLINICAL DATA:  T7 kyphoplasty. EXAM: DG C-ARM 61-120 MIN; LUMBAR SPINE - 2-3 VIEW COMPARISON:  CT 12/15/2018 FINDINGS: Two fluoroscopic images demonstrate bone cement within a vertebral body and presumably this is T7. There is a small amount of cement extending posterior to the vertebral body, presumably within the pedicle. There is no significant cement superior, inferior or anterior to the vertebral body. IMPRESSION: Fluoroscopic images of the T7 kyphoplasty. Electronically Signed   By: Markus Daft M.D.   On: 12/18/2018 19:49    Subjective: Resting well no new complaints.  Pain is stable not needing any pain medication.  She ambulate with physical therapy this morning and did well.  Discharge Exam: Vitals:   12/19/18 0422 12/19/18 0754  BP: 125/60 135/67  Pulse: (!) 58 60  Resp: 18 18  Temp: 97.6 F (36.4 C) 97.7 F (36.5 C)  SpO2: 96% 98%   Vitals:   12/18/18 1940 12/18/18 2322 12/19/18 0422 12/19/18 0754  BP: (!) 152/65 (!) 119/51 125/60 135/67  Pulse: 86 79 (!) 58 60  Resp: 17 17 18 18   Temp: 98.3 F (36.8 C) 98.9 F (37.2 C) 97.6 F (36.4 C) 97.7 F (36.5 C)  TempSrc: Oral Oral Oral Oral  SpO2: 93% 96% 96% 98%  Weight:      Height:        General: Pt is alert, awake, not in acute distress Cardiovascular: RRR, S1/S2 +, no rubs, no gallops Respiratory: CTA bilaterally, no wheezing, no rhonchi Abdominal: Soft, NT, ND, bowel sounds + Extremities: no edema, no cyanosis   The results of significant diagnostics from this hospitalization (including imaging, microbiology, ancillary and laboratory) are listed below for reference.     Microbiology: Recent Results (from the past 240 hour(s))  Urine culture     Status: Abnormal   Collection Time: 12/13/18 11:47 PM   Specimen: Urine, Random  Result Value Ref Range Status   Specimen Description   Final    URINE, RANDOM Performed  at [facility] [address] [place], [place] [address]    Special Requests   Final    NONE Performed at [facility], [place] [address], [place], [place] [address]    Culture (A)  Final    <10,000 COLONIES/mL INSIGNIFICANT GROWTH Performed at [facility] [address] [place], [place] [address]    Report Status 12/15/2018 FINAL  Final  Urine culture     Status: None   Collection Time: 12/15/18 10:20 AM   Specimen: Urine, Random  Result Value Ref Range Status   Specimen Description   Final    URINE, RANDOM Performed at [facility] [address], [place], [place] [address]    Special Requests   Final    NONE Performed at [facility], [place] [address], [place], [place] [address]    Culture   Final    NO GROWTH Performed at [facility], [place] [address], [place], [place] [address]    Report Status  12/16/2018 FINAL  Final  SARS Coronavirus 2 (CEPHEID - Performed in [place] hospital lab), Hosp Order     Status: None   Collection Time: 12/15/18  1:33 PM   Specimen: Nasopharyngeal Swab  Result Value Ref Range Status   SARS Coronavirus 2 NEGATIVE NEGATIVE Final    Comment: (NOTE) If result is NEGATIVE SARS-CoV-2 target nucleic acids are NOT DETECTED. The SARS-CoV-2 RNA is generally detectable in upper and lower  respiratory specimens during the acute phase of infection. The lowest  concentration of SARS-CoV-2 viral copies this assay can detect is 250  copies / mL. A negative result does not preclude SARS-CoV-2 infection  and should not be used as the sole basis for treatment or other  patient management decisions.  A negative result may occur with  improper specimen collection / handling, submission of specimen other  than nasopharyngeal swab, presence of viral mutation(s) within the  areas targeted by this assay, and inadequate number of viral copies  (<250 copies / mL). A negative  result must be combined with clinical  observations, patient history, and epidemiological information. If result is POSITIVE SARS-CoV-2 target nucleic acids are DETECTED. The SARS-CoV-2 RNA is generally detectable in upper and lower  respiratory specimens dur ing the acute phase of infection.  Positive  results are indicative of active infection with SARS-CoV-2.  Clinical  correlation with patient history and other diagnostic information is  necessary to determine patient infection status.  Positive results do  not rule out bacterial infection or co-infection with other viruses. If result is PRESUMPTIVE POSTIVE SARS-CoV-2 nucleic acids MAY BE PRESENT.   A presumptive positive result was obtained on the submitted specimen  and confirmed on repeat testing.  While 2019 novel coronavirus  (SARS-CoV-2) nucleic acids may be present in the submitted sample  additional confirmatory testing may be necessary for epidemiological  and / or clinical management purposes  to differentiate between  SARS-CoV-2 and other Sarbecovirus currently known to infect humans.  If clinically indicated additional testing with an alternate test  methodology [phone]) is advised. The SARS-CoV-2 RNA is generally  detectable in upper and lower respiratory sp ecimens during the acute  phase of infection. The expected result is Negative. Fact Sheet for Patients:  StrictlyIdeas.no Fact Sheet for Healthcare Providers: BankingDealers.co.za This test is not yet approved or cleared by the [place] FDA and has been authorized for detection and/or diagnosis of SARS-CoV-2 by FDA under an Emergency Use Authorization (EUA).  This EUA will remain in effect (meaning this test can be used) for the duration of the COVID-19 declaration under Section 564(b)(1) of the Act, 21 U.S.C. section 360bbb-3(b)(1), unless the authorization is terminated or revoked sooner. Performed at [facility], [place] [address] [place], [place] [address]   Surgical pcr screen     Status: None   Collection Time: 12/17/18  9:58 PM   Specimen: Nasal Mucosa; Nasal Swab  Result Value Ref Range Status   MRSA, PCR NEGATIVE NEGATIVE Final   Staphylococcus aureus NEGATIVE NEGATIVE Final    Comment: (NOTE) The Xpert SA Assay (FDA approved for NASAL specimens in patients 70 years of age and older), is one component of a comprehensive surveillance program. It is not intended to diagnose infection nor to guide or monitor treatment. Performed at [facility], [place] [address] [place], [place] [address]      Labs: BNP (last 3 results) No results for input(s): BNP in the last 8760 hours. Basic Metabolic  Panel: Recent Labs  Lab 12/13/18 2347 12/15/18 1020 12/17/18 0715  NA 140 137 137  K 3.9 3.8 3.8  CL 105 100 100  CO2 27 26 28   GLUCOSE 117* 127* 101*  BUN 13 10 14   CREATININE 0.71 0.61 0.79  CALCIUM 8.8* 9.2 8.8*   Liver Function Tests: Recent Labs  Lab 12/15/18 1020  AST 22  ALT 19  ALKPHOS 75  BILITOT 1.5*  PROT 7.7  ALBUMIN 4.5   No results for input(s): LIPASE, AMYLASE in the last 168 hours. No results for input(s): AMMONIA in the last 168 hours. CBC: Recent Labs  Lab 12/13/18 2347 12/15/18 1020 12/17/18 0715  WBC 8.1 10.0 10.3  NEUTROABS 5.8 8.6* 7.5  HGB 13.5 15.1* 14.0  HCT 43.0 46.8* 43.0  MCV 94.1 92.9 91.7  PLT 176 201 194   Cardiac Enzymes: No results for input(s): CKTOTAL, CKMB, CKMBINDEX, TROPONINI in the last 168 hours. BNP: Invalid input(s): POCBNP CBG: No results for input(s): GLUCAP in the last 168 hours. D-Dimer No results for input(s): DDIMER in the last 72 hours. Hgb A1c No results for input(s): HGBA1C in the last 72 hours. Lipid Profile No results for input(s): CHOL, HDL, LDLCALC, TRIG, CHOLHDL, LDLDIRECT in the last 72 hours. Thyroid function studies No results for input(s): TSH, T4TOTAL, T3FREE, THYROIDAB in  the last 72 hours.  Invalid input(s): FREET3 Anemia work up No results for input(s): VITAMINB12, FOLATE, FERRITIN, TIBC, IRON, RETICCTPCT in the last 72 hours. Urinalysis    Component Value Date/Time   COLORURINE YELLOW 12/15/2018 1020   APPEARANCEUR CLEAR 12/15/2018 1020   LABSPEC 1.017 12/15/2018 1020   PHURINE 5.0 12/15/2018 1020   GLUCOSEU NEGATIVE 12/15/2018 1020   HGBUR NEGATIVE 12/15/2018 1020   BILIRUBINUR NEGATIVE 12/15/2018 1020   KETONESUR 20 (A) 12/15/2018 1020   PROTEINUR NEGATIVE 12/15/2018 1020   UROBILINOGEN 0.2 02/04/2011 2130   NITRITE NEGATIVE 12/15/2018 1020   LEUKOCYTESUR NEGATIVE 12/15/2018 1020   Sepsis Labs Invalid input(s): PROCALCITONIN,  WBC,  LACTICIDVEN Microbiology Recent Results (from the past 240 hour(s))  Urine culture     Status: Abnormal   Collection Time: 12/13/18 11:47 PM   Specimen: Urine, Random  Result Value Ref Range Status   Specimen Description   Final    URINE, RANDOM Performed at [facility], [place] [address] [place], [place] [address]    Special Requests   Final    NONE Performed at [facility], [place] [address], [place], [place] [address]    Culture (A)  Final    <10,000 COLONIES/mL INSIGNIFICANT GROWTH Performed at [facility] [address], [place], [place] [address]    Report Status 12/15/2018 FINAL  Final  Urine culture     Status: None   Collection Time: 12/15/18 10:20 AM   Specimen: Urine, Random  Result Value Ref Range Status   Specimen Description   Final    URINE, RANDOM Performed at [facility] [address], [place], [place] [address]    Special Requests   Final    NONE Performed at [facility], [place] [address], [place], [place] [address]    Culture   Final    NO GROWTH Performed at [facility], [place] [address], [place], [place] [address]    Report Status 12/16/2018 FINAL  Final  SARS Coronavirus 2 (CEPHEID -  Performed in [place] hospital lab), Hosp Order     Status: None  Collection Time: 12/15/18  1:33 PM   Specimen: Nasopharyngeal Swab  Result Value Ref Range Status   SARS Coronavirus 2 NEGATIVE NEGATIVE Final    Comment: (NOTE) If result is NEGATIVE SARS-CoV-2 target nucleic acids are NOT DETECTED. The SARS-CoV-2 RNA is generally detectable in upper and lower  respiratory specimens during the acute phase of infection. The lowest  concentration of SARS-CoV-2 viral copies this assay can detect is 250  copies / mL. A negative result does not preclude SARS-CoV-2 infection  and should not be used as the sole basis for treatment or other  patient management decisions.  A negative result may occur with  improper specimen collection / handling, submission of specimen other  than nasopharyngeal swab, presence of viral mutation(s) within the  areas targeted by this assay, and inadequate number of viral copies  (<250 copies / mL). A negative result must be combined with clinical  observations, patient history, and epidemiological information. If result is POSITIVE SARS-CoV-2 target nucleic acids are DETECTED. The SARS-CoV-2 RNA is generally detectable in upper and lower  respiratory specimens dur ing the acute phase of infection.  Positive  results are indicative of active infection with SARS-CoV-2.  Clinical  correlation with patient history and other diagnostic information is  necessary to determine patient infection status.  Positive results do  not rule out bacterial infection or co-infection with other viruses. If result is PRESUMPTIVE POSTIVE SARS-CoV-2 nucleic acids MAY BE PRESENT.   A presumptive positive result was obtained on the submitted specimen  and confirmed on repeat testing.  While 2019 novel coronavirus  (SARS-CoV-2) nucleic acids may be present in the submitted sample  additional confirmatory testing may be necessary for epidemiological  and / or clinical management  purposes  to differentiate between  SARS-CoV-2 and other Sarbecovirus currently known to infect humans.  If clinically indicated additional testing with an alternate test  methodology [phone]) is advised. The SARS-CoV-2 RNA is generally  detectable in upper and lower respiratory sp ecimens during the acute  phase of infection. The expected result is Negative. Fact Sheet for Patients:  StrictlyIdeas.no Fact Sheet for Healthcare Providers: BankingDealers.co.za This test is not yet approved or cleared by the [place] FDA and has been authorized for detection and/or diagnosis of SARS-CoV-2 by FDA under an Emergency Use Authorization (EUA).  This EUA will remain in effect (meaning this test can be used) for the duration of the COVID-19 declaration under Section 564(b)(1) of the Act, 21 U.S.C. section 360bbb-3(b)(1), unless the authorization is terminated or revoked sooner. Performed at [facility], [place] [address] [place], [place] [id number]   Surgical pcr screen     Status: None   Collection Time: 12/17/18  9:58 PM   Specimen: Nasal Mucosa; Nasal Swab  Result Value Ref Range Status   MRSA, PCR NEGATIVE NEGATIVE Final   Staphylococcus aureus NEGATIVE NEGATIVE Final    Comment: (NOTE) The Xpert SA Assay (FDA approved for NASAL specimens in patients 87 years of age and older), is one component of a comprehensive surveillance program. It is not intended to diagnose infection nor to guide or monitor treatment. Performed at [facility], [place] [address], [place], [place] [address]      Time coordinating discharge: 25 minutes  SIGNED:   Antonieta Pert, MD  [facility] 12/19/2018, 8:57 AM  If 7PM-7AM, please contact night-coverage [email or web address]

## 2018-12-19 NOTE — Progress Notes (Signed)
Physical Therapy Treatment Patient Details Name: Jill Mcdowell MRN: [id number] DOB: [birth year] Today's Date: 12/19/2018    History of Present Illness Patient presenting to the ED on 12/15/18 with primary complaints of severe back pain. Diagnosed with T7 compression fracture - awaiting neurosurgery consult per chart. past medical history significant of PVCs.     PT Comments    Patient s/p T7 kyphoplasty on 12/18/2018. Patient reporting much improved pain levels following procedure. Discussion of back precautions with good understanding. Patient ambulating in hallway with mild unsteadiness - will benefit from RW at discharge. Will continue to recommend HHPT.    Follow Up Recommendations  Home health PT;Supervision - Intermittent     Equipment Recommendations  Rolling walker with 5" wheels    Recommendations for Other Services       Precautions / Restrictions Precautions Precautions: Fall;Back Precaution Comments: discussed back precautions of no bending, arching, and twisting set by MD Restrictions Weight Bearing Restrictions: No    Mobility  Bed Mobility Overal bed mobility: Modified Independent                Transfers Overall transfer level: Needs assistance Equipment used: None Transfers: Sit to/from Stand Sit to Stand: Supervision         General transfer comment: for safety  Ambulation/Gait Ambulation/Gait assistance: Min guard;Supervision Gait Distance (Feet): 150 Feet Assistive device: None Gait Pattern/deviations: Step-through pattern;Decreased stride length;Trunk flexed;Drifts right/left Gait velocity: decreased   General Gait Details: mild unsteadiness with 1-2 LOB - patient able to self recover - will need RW at discharge   Stairs             Wheelchair Mobility    Modified Rankin (Stroke Patients Only)       Balance Overall balance assessment: Mild deficits observed, not formally tested                                          Cognition Arousal/Alertness: Awake/alert Behavior During Therapy: WFL for tasks assessed/performed Overall Cognitive Status: Within Functional Limits for tasks assessed                                        Exercises      General Comments        Pertinent Vitals/Pain Pain Assessment: 0-10 Pain Score: 1  Pain Location: back Pain Descriptors / Indicators: Sore Pain Intervention(s): Limited activity within patient's tolerance;Monitored during session;Repositioned    Home Living                      Prior Function            PT Goals (current goals can now be found in the care plan section) Acute Rehab PT Goals Patient Stated Goal: reduce pain PT Goal Formulation: With patient Time For Goal Achievement: 12/30/18 Potential to Achieve Goals: Good Progress towards PT goals: Progressing toward goals    Frequency    Min 5X/week      PT Plan Current plan remains appropriate    Co-evaluation              AM-PAC PT "6 Clicks" Mobility   Outcome Measure  Help needed turning from your back to your side while in a flat bed without using bedrails?: None Help needed  moving from lying on your back to sitting on the side of a flat bed without using bedrails?: None Help needed moving to and from a bed to a chair (including a wheelchair)?: A Little Help needed standing up from a chair using your arms (e.g., wheelchair or bedside chair)?: A Little Help needed to walk in hospital room?: A Little Help needed climbing 3-5 steps with a railing? : A Little 6 Click Score: 20    End of Session Equipment Utilized During Treatment: Gait belt Activity Tolerance: Patient tolerated treatment well Patient left: in bed;with call bell/phone within reach;with bed alarm set Nurse Communication: Mobility status PT Visit Diagnosis: Unsteadiness on feet (R26.81);Other abnormalities of gait and mobility (R26.89);Muscle weakness (generalized)  (M62.81);Pain Pain - part of body: (back)     Time: 8127-5170 PT Time Calculation (min) (ACUTE ONLY): 14 min  Charges:  $Gait Training: 8-22 mins                      Lanney Gins, PT, DPT Supplemental Physical Therapist 12/19/18 8:44 AM Pager: [phone] Office: [phone]

## 2018-12-20 DIAGNOSIS — I349 Nonrheumatic mitral valve disorder, unspecified: Secondary | ICD-10-CM | POA: Diagnosis not present

## 2018-12-20 DIAGNOSIS — S22060D Wedge compression fracture of T7-T8 vertebra, subsequent encounter for fracture with routine healing: Secondary | ICD-10-CM | POA: Diagnosis not present

## 2018-12-20 DIAGNOSIS — Z4789 Encounter for other orthopedic aftercare: Secondary | ICD-10-CM | POA: Diagnosis not present

## 2018-12-20 DIAGNOSIS — M6281 Muscle weakness (generalized): Secondary | ICD-10-CM | POA: Diagnosis not present

## 2018-12-20 DIAGNOSIS — I4891 Unspecified atrial fibrillation: Secondary | ICD-10-CM | POA: Diagnosis not present

## 2018-12-20 DIAGNOSIS — I10 Essential (primary) hypertension: Secondary | ICD-10-CM | POA: Diagnosis not present

## 2018-12-23 DIAGNOSIS — I10 Essential (primary) hypertension: Secondary | ICD-10-CM | POA: Diagnosis not present

## 2018-12-23 DIAGNOSIS — R195 Other fecal abnormalities: Secondary | ICD-10-CM | POA: Diagnosis not present

## 2018-12-23 DIAGNOSIS — Z4789 Encounter for other orthopedic aftercare: Secondary | ICD-10-CM | POA: Diagnosis not present

## 2018-12-23 DIAGNOSIS — S22000A Wedge compression fracture of unspecified thoracic vertebra, initial encounter for closed fracture: Secondary | ICD-10-CM | POA: Diagnosis not present

## 2018-12-23 DIAGNOSIS — I4891 Unspecified atrial fibrillation: Secondary | ICD-10-CM | POA: Diagnosis not present

## 2018-12-23 DIAGNOSIS — M533 Sacrococcygeal disorders, not elsewhere classified: Secondary | ICD-10-CM | POA: Diagnosis not present

## 2018-12-23 DIAGNOSIS — Z9889 Other specified postprocedural states: Secondary | ICD-10-CM | POA: Diagnosis not present

## 2018-12-23 DIAGNOSIS — I349 Nonrheumatic mitral valve disorder, unspecified: Secondary | ICD-10-CM | POA: Diagnosis not present

## 2018-12-23 DIAGNOSIS — S22060D Wedge compression fracture of T7-T8 vertebra, subsequent encounter for fracture with routine healing: Secondary | ICD-10-CM | POA: Diagnosis not present

## 2018-12-23 DIAGNOSIS — K5900 Constipation, unspecified: Secondary | ICD-10-CM | POA: Diagnosis not present

## 2018-12-23 DIAGNOSIS — M6281 Muscle weakness (generalized): Secondary | ICD-10-CM | POA: Diagnosis not present

## 2018-12-26 DIAGNOSIS — I4891 Unspecified atrial fibrillation: Secondary | ICD-10-CM | POA: Diagnosis not present

## 2018-12-26 DIAGNOSIS — M6281 Muscle weakness (generalized): Secondary | ICD-10-CM | POA: Diagnosis not present

## 2018-12-26 DIAGNOSIS — I349 Nonrheumatic mitral valve disorder, unspecified: Secondary | ICD-10-CM | POA: Diagnosis not present

## 2018-12-26 DIAGNOSIS — S22060D Wedge compression fracture of T7-T8 vertebra, subsequent encounter for fracture with routine healing: Secondary | ICD-10-CM | POA: Diagnosis not present

## 2018-12-26 DIAGNOSIS — Z4789 Encounter for other orthopedic aftercare: Secondary | ICD-10-CM | POA: Diagnosis not present

## 2018-12-26 DIAGNOSIS — I10 Essential (primary) hypertension: Secondary | ICD-10-CM | POA: Diagnosis not present

## 2018-12-30 DIAGNOSIS — Z961 Presence of intraocular lens: Secondary | ICD-10-CM | POA: Diagnosis not present

## 2018-12-30 DIAGNOSIS — H35372 Puckering of macula, left eye: Secondary | ICD-10-CM | POA: Diagnosis not present

## 2018-12-30 DIAGNOSIS — H47393 Other disorders of optic disc, bilateral: Secondary | ICD-10-CM | POA: Diagnosis not present

## 2018-12-30 DIAGNOSIS — H3122 Choroidal dystrophy (central areolar) (generalized) (peripapillary): Secondary | ICD-10-CM | POA: Diagnosis not present

## 2018-12-30 DIAGNOSIS — H35453 Secondary pigmentary degeneration, bilateral: Secondary | ICD-10-CM | POA: Diagnosis not present

## 2018-12-30 DIAGNOSIS — H35342 Macular cyst, hole, or pseudohole, left eye: Secondary | ICD-10-CM | POA: Diagnosis not present

## 2018-12-30 DIAGNOSIS — H353111 Nonexudative age-related macular degeneration, right eye, early dry stage: Secondary | ICD-10-CM | POA: Diagnosis not present

## 2018-12-30 DIAGNOSIS — H353122 Nonexudative age-related macular degeneration, left eye, intermediate dry stage: Secondary | ICD-10-CM | POA: Diagnosis not present

## 2018-12-31 DIAGNOSIS — L989 Disorder of the skin and subcutaneous tissue, unspecified: Secondary | ICD-10-CM | POA: Diagnosis not present

## 2018-12-31 DIAGNOSIS — Z1211 Encounter for screening for malignant neoplasm of colon: Secondary | ICD-10-CM | POA: Diagnosis not present

## 2019-01-01 DIAGNOSIS — I10 Essential (primary) hypertension: Secondary | ICD-10-CM | POA: Diagnosis not present

## 2019-01-01 DIAGNOSIS — S22060D Wedge compression fracture of T7-T8 vertebra, subsequent encounter for fracture with routine healing: Secondary | ICD-10-CM | POA: Diagnosis not present

## 2019-01-01 DIAGNOSIS — M6281 Muscle weakness (generalized): Secondary | ICD-10-CM | POA: Diagnosis not present

## 2019-01-01 DIAGNOSIS — Z4789 Encounter for other orthopedic aftercare: Secondary | ICD-10-CM | POA: Diagnosis not present

## 2019-01-01 DIAGNOSIS — I4891 Unspecified atrial fibrillation: Secondary | ICD-10-CM | POA: Diagnosis not present

## 2019-01-01 DIAGNOSIS — I349 Nonrheumatic mitral valve disorder, unspecified: Secondary | ICD-10-CM | POA: Diagnosis not present

## 2019-01-03 DIAGNOSIS — M6281 Muscle weakness (generalized): Secondary | ICD-10-CM | POA: Diagnosis not present

## 2019-01-03 DIAGNOSIS — I349 Nonrheumatic mitral valve disorder, unspecified: Secondary | ICD-10-CM | POA: Diagnosis not present

## 2019-01-03 DIAGNOSIS — S22060D Wedge compression fracture of T7-T8 vertebra, subsequent encounter for fracture with routine healing: Secondary | ICD-10-CM | POA: Diagnosis not present

## 2019-01-03 DIAGNOSIS — I10 Essential (primary) hypertension: Secondary | ICD-10-CM | POA: Diagnosis not present

## 2019-01-03 DIAGNOSIS — Z4789 Encounter for other orthopedic aftercare: Secondary | ICD-10-CM | POA: Diagnosis not present

## 2019-01-03 DIAGNOSIS — I4891 Unspecified atrial fibrillation: Secondary | ICD-10-CM | POA: Diagnosis not present

## 2019-01-07 DIAGNOSIS — R609 Edema, unspecified: Secondary | ICD-10-CM | POA: Diagnosis not present

## 2019-01-07 DIAGNOSIS — Z9889 Other specified postprocedural states: Secondary | ICD-10-CM | POA: Diagnosis not present

## 2019-01-07 DIAGNOSIS — R21 Rash and other nonspecific skin eruption: Secondary | ICD-10-CM | POA: Diagnosis not present

## 2019-01-08 DIAGNOSIS — Z4789 Encounter for other orthopedic aftercare: Secondary | ICD-10-CM | POA: Diagnosis not present

## 2019-01-08 DIAGNOSIS — I10 Essential (primary) hypertension: Secondary | ICD-10-CM | POA: Diagnosis not present

## 2019-01-08 DIAGNOSIS — M6281 Muscle weakness (generalized): Secondary | ICD-10-CM | POA: Diagnosis not present

## 2019-01-08 DIAGNOSIS — I349 Nonrheumatic mitral valve disorder, unspecified: Secondary | ICD-10-CM | POA: Diagnosis not present

## 2019-01-08 DIAGNOSIS — S22060D Wedge compression fracture of T7-T8 vertebra, subsequent encounter for fracture with routine healing: Secondary | ICD-10-CM | POA: Diagnosis not present

## 2019-01-08 DIAGNOSIS — I4891 Unspecified atrial fibrillation: Secondary | ICD-10-CM | POA: Diagnosis not present

## 2019-01-13 DIAGNOSIS — I349 Nonrheumatic mitral valve disorder, unspecified: Secondary | ICD-10-CM | POA: Diagnosis not present

## 2019-01-13 DIAGNOSIS — S22060D Wedge compression fracture of T7-T8 vertebra, subsequent encounter for fracture with routine healing: Secondary | ICD-10-CM | POA: Diagnosis not present

## 2019-01-13 DIAGNOSIS — M6281 Muscle weakness (generalized): Secondary | ICD-10-CM | POA: Diagnosis not present

## 2019-01-13 DIAGNOSIS — I4891 Unspecified atrial fibrillation: Secondary | ICD-10-CM | POA: Diagnosis not present

## 2019-01-13 DIAGNOSIS — I10 Essential (primary) hypertension: Secondary | ICD-10-CM | POA: Diagnosis not present

## 2019-01-13 DIAGNOSIS — Z4789 Encounter for other orthopedic aftercare: Secondary | ICD-10-CM | POA: Diagnosis not present

## 2019-01-14 DIAGNOSIS — Z1159 Encounter for screening for other viral diseases: Secondary | ICD-10-CM | POA: Diagnosis not present

## 2019-01-14 DIAGNOSIS — Z7189 Other specified counseling: Secondary | ICD-10-CM | POA: Diagnosis not present

## 2019-01-15 DIAGNOSIS — S22060A Wedge compression fracture of T7-T8 vertebra, initial encounter for closed fracture: Secondary | ICD-10-CM | POA: Diagnosis not present

## 2019-01-15 DIAGNOSIS — S22060G Wedge compression fracture of T7-T8 vertebra, subsequent encounter for fracture with delayed healing: Secondary | ICD-10-CM | POA: Diagnosis not present

## 2019-01-15 DIAGNOSIS — S22000D Wedge compression fracture of unspecified thoracic vertebra, subsequent encounter for fracture with routine healing: Secondary | ICD-10-CM | POA: Diagnosis not present

## 2019-01-15 DIAGNOSIS — E878 Other disorders of electrolyte and fluid balance, not elsewhere classified: Secondary | ICD-10-CM | POA: Diagnosis not present

## 2019-01-15 DIAGNOSIS — Z1159 Encounter for screening for other viral diseases: Secondary | ICD-10-CM | POA: Diagnosis not present

## 2019-01-23 ENCOUNTER — Other Ambulatory Visit: Payer: Self-pay | Admitting: Gastroenterology

## 2019-01-23 DIAGNOSIS — K219 Gastro-esophageal reflux disease without esophagitis: Secondary | ICD-10-CM | POA: Diagnosis not present

## 2019-01-23 DIAGNOSIS — R1310 Dysphagia, unspecified: Secondary | ICD-10-CM | POA: Diagnosis not present

## 2019-01-23 DIAGNOSIS — K449 Diaphragmatic hernia without obstruction or gangrene: Secondary | ICD-10-CM | POA: Diagnosis not present

## 2019-01-23 DIAGNOSIS — D509 Iron deficiency anemia, unspecified: Secondary | ICD-10-CM | POA: Diagnosis not present

## 2019-01-24 DIAGNOSIS — M6281 Muscle weakness (generalized): Secondary | ICD-10-CM | POA: Diagnosis not present

## 2019-01-24 DIAGNOSIS — I4891 Unspecified atrial fibrillation: Secondary | ICD-10-CM | POA: Diagnosis not present

## 2019-01-24 DIAGNOSIS — Z1159 Encounter for screening for other viral diseases: Secondary | ICD-10-CM | POA: Diagnosis not present

## 2019-01-24 DIAGNOSIS — Z4789 Encounter for other orthopedic aftercare: Secondary | ICD-10-CM | POA: Diagnosis not present

## 2019-01-24 DIAGNOSIS — I10 Essential (primary) hypertension: Secondary | ICD-10-CM | POA: Diagnosis not present

## 2019-01-24 DIAGNOSIS — I349 Nonrheumatic mitral valve disorder, unspecified: Secondary | ICD-10-CM | POA: Diagnosis not present

## 2019-01-24 DIAGNOSIS — S22060D Wedge compression fracture of T7-T8 vertebra, subsequent encounter for fracture with routine healing: Secondary | ICD-10-CM | POA: Diagnosis not present

## 2019-01-30 ENCOUNTER — Ambulatory Visit
Admission: RE | Admit: 2019-01-30 | Discharge: 2019-01-30 | Disposition: A | Discharge status: Home / Self Care | Payer: Medicare HMO | Source: Ambulatory Visit | Attending: Gastroenterology | Admitting: Gastroenterology

## 2019-01-30 ENCOUNTER — Other Ambulatory Visit: Payer: Self-pay | Admitting: Gastroenterology

## 2019-01-30 DIAGNOSIS — K222 Esophageal obstruction: Secondary | ICD-10-CM | POA: Diagnosis not present

## 2019-01-30 DIAGNOSIS — R1310 Dysphagia, unspecified: Secondary | ICD-10-CM

## 2019-01-30 DIAGNOSIS — K224 Dyskinesia of esophagus: Secondary | ICD-10-CM | POA: Diagnosis not present

## 2019-02-06 DIAGNOSIS — Z1159 Encounter for screening for other viral diseases: Secondary | ICD-10-CM | POA: Diagnosis not present

## 2019-02-11 DIAGNOSIS — Z1211 Encounter for screening for malignant neoplasm of colon: Secondary | ICD-10-CM | POA: Diagnosis not present

## 2019-02-14 DIAGNOSIS — S22060A Wedge compression fracture of T7-T8 vertebra, initial encounter for closed fracture: Secondary | ICD-10-CM | POA: Diagnosis not present

## 2019-02-14 DIAGNOSIS — S22060D Wedge compression fracture of T7-T8 vertebra, subsequent encounter for fracture with routine healing: Secondary | ICD-10-CM | POA: Diagnosis not present

## 2019-02-20 DIAGNOSIS — H00011 Hordeolum externum right upper eyelid: Secondary | ICD-10-CM | POA: Diagnosis not present

## 2019-02-24 DIAGNOSIS — R1310 Dysphagia, unspecified: Secondary | ICD-10-CM | POA: Diagnosis not present

## 2019-03-11 DIAGNOSIS — Z23 Encounter for immunization: Secondary | ICD-10-CM | POA: Diagnosis not present

## 2019-03-17 DIAGNOSIS — L821 Other seborrheic keratosis: Secondary | ICD-10-CM | POA: Diagnosis not present

## 2019-03-17 DIAGNOSIS — I872 Venous insufficiency (chronic) (peripheral): Secondary | ICD-10-CM | POA: Diagnosis not present

## 2019-03-17 DIAGNOSIS — D229 Melanocytic nevi, unspecified: Secondary | ICD-10-CM | POA: Diagnosis not present

## 2019-03-24 DIAGNOSIS — Z741 Need for assistance with personal care: Secondary | ICD-10-CM | POA: Diagnosis not present

## 2019-03-24 DIAGNOSIS — Z9889 Other specified postprocedural states: Secondary | ICD-10-CM | POA: Diagnosis not present

## 2019-03-24 DIAGNOSIS — Z789 Other specified health status: Secondary | ICD-10-CM | POA: Diagnosis not present

## 2019-03-24 DIAGNOSIS — M810 Age-related osteoporosis without current pathological fracture: Secondary | ICD-10-CM | POA: Diagnosis not present

## 2019-04-21 DIAGNOSIS — R05 Cough: Secondary | ICD-10-CM | POA: Diagnosis not present

## 2019-04-23 DIAGNOSIS — H35372 Puckering of macula, left eye: Secondary | ICD-10-CM | POA: Diagnosis not present

## 2019-04-23 DIAGNOSIS — H26492 Other secondary cataract, left eye: Secondary | ICD-10-CM | POA: Diagnosis not present

## 2019-04-23 DIAGNOSIS — H353132 Nonexudative age-related macular degeneration, bilateral, intermediate dry stage: Secondary | ICD-10-CM | POA: Diagnosis not present

## 2019-04-23 DIAGNOSIS — H5211 Myopia, right eye: Secondary | ICD-10-CM | POA: Diagnosis not present

## 2019-05-13 DIAGNOSIS — R05 Cough: Secondary | ICD-10-CM | POA: Diagnosis not present

## 2019-05-13 DIAGNOSIS — I868 Varicose veins of other specified sites: Secondary | ICD-10-CM | POA: Diagnosis not present

## 2019-05-13 DIAGNOSIS — J302 Other seasonal allergic rhinitis: Secondary | ICD-10-CM | POA: Diagnosis not present

## 2019-05-13 DIAGNOSIS — M79604 Pain in right leg: Secondary | ICD-10-CM | POA: Diagnosis not present

## 2019-05-20 DIAGNOSIS — S22060G Wedge compression fracture of T7-T8 vertebra, subsequent encounter for fracture with delayed healing: Secondary | ICD-10-CM | POA: Diagnosis not present

## 2019-05-20 DIAGNOSIS — S22060D Wedge compression fracture of T7-T8 vertebra, subsequent encounter for fracture with routine healing: Secondary | ICD-10-CM | POA: Diagnosis not present

## 2019-05-21 DIAGNOSIS — E039 Hypothyroidism, unspecified: Secondary | ICD-10-CM | POA: Diagnosis not present

## 2019-05-21 DIAGNOSIS — E7800 Pure hypercholesterolemia, unspecified: Secondary | ICD-10-CM | POA: Diagnosis not present

## 2019-05-21 DIAGNOSIS — D509 Iron deficiency anemia, unspecified: Secondary | ICD-10-CM | POA: Diagnosis not present

## 2019-05-21 DIAGNOSIS — H269 Unspecified cataract: Secondary | ICD-10-CM | POA: Diagnosis not present

## 2019-05-21 DIAGNOSIS — I480 Paroxysmal atrial fibrillation: Secondary | ICD-10-CM | POA: Diagnosis not present

## 2019-05-21 DIAGNOSIS — M810 Age-related osteoporosis without current pathological fracture: Secondary | ICD-10-CM | POA: Diagnosis not present

## 2019-06-13 ENCOUNTER — Ambulatory Visit: Payer: Medicare HMO | Admitting: Cardiovascular Disease

## 2019-06-18 DIAGNOSIS — R195 Other fecal abnormalities: Secondary | ICD-10-CM | POA: Diagnosis not present

## 2019-06-18 DIAGNOSIS — E7800 Pure hypercholesterolemia, unspecified: Secondary | ICD-10-CM | POA: Diagnosis not present

## 2019-06-18 DIAGNOSIS — R609 Edema, unspecified: Secondary | ICD-10-CM | POA: Diagnosis not present

## 2019-06-18 DIAGNOSIS — E878 Other disorders of electrolyte and fluid balance, not elsewhere classified: Secondary | ICD-10-CM | POA: Diagnosis not present

## 2019-06-18 DIAGNOSIS — D509 Iron deficiency anemia, unspecified: Secondary | ICD-10-CM | POA: Diagnosis not present

## 2019-06-18 DIAGNOSIS — Z1211 Encounter for screening for malignant neoplasm of colon: Secondary | ICD-10-CM | POA: Diagnosis not present

## 2019-06-18 DIAGNOSIS — E559 Vitamin D deficiency, unspecified: Secondary | ICD-10-CM | POA: Diagnosis not present

## 2019-06-18 DIAGNOSIS — L989 Disorder of the skin and subcutaneous tissue, unspecified: Secondary | ICD-10-CM | POA: Diagnosis not present

## 2019-06-18 DIAGNOSIS — E039 Hypothyroidism, unspecified: Secondary | ICD-10-CM | POA: Diagnosis not present

## 2019-06-24 DIAGNOSIS — L309 Dermatitis, unspecified: Secondary | ICD-10-CM | POA: Diagnosis not present

## 2019-06-24 DIAGNOSIS — I493 Ventricular premature depolarization: Secondary | ICD-10-CM | POA: Diagnosis not present

## 2019-06-24 DIAGNOSIS — L821 Other seborrheic keratosis: Secondary | ICD-10-CM | POA: Diagnosis not present

## 2019-06-24 DIAGNOSIS — D509 Iron deficiency anemia, unspecified: Secondary | ICD-10-CM | POA: Diagnosis not present

## 2019-06-24 DIAGNOSIS — E7800 Pure hypercholesterolemia, unspecified: Secondary | ICD-10-CM | POA: Diagnosis not present

## 2019-06-24 DIAGNOSIS — L570 Actinic keratosis: Secondary | ICD-10-CM | POA: Diagnosis not present

## 2019-06-24 DIAGNOSIS — E039 Hypothyroidism, unspecified: Secondary | ICD-10-CM | POA: Diagnosis not present

## 2019-06-24 DIAGNOSIS — E559 Vitamin D deficiency, unspecified: Secondary | ICD-10-CM | POA: Diagnosis not present

## 2019-06-24 DIAGNOSIS — J302 Other seasonal allergic rhinitis: Secondary | ICD-10-CM | POA: Diagnosis not present

## 2019-07-31 DIAGNOSIS — M810 Age-related osteoporosis without current pathological fracture: Secondary | ICD-10-CM | POA: Diagnosis not present

## 2019-07-31 DIAGNOSIS — E039 Hypothyroidism, unspecified: Secondary | ICD-10-CM | POA: Diagnosis not present

## 2019-07-31 DIAGNOSIS — D509 Iron deficiency anemia, unspecified: Secondary | ICD-10-CM | POA: Diagnosis not present

## 2019-07-31 DIAGNOSIS — H269 Unspecified cataract: Secondary | ICD-10-CM | POA: Diagnosis not present

## 2019-07-31 DIAGNOSIS — E7800 Pure hypercholesterolemia, unspecified: Secondary | ICD-10-CM | POA: Diagnosis not present

## 2019-07-31 DIAGNOSIS — I480 Paroxysmal atrial fibrillation: Secondary | ICD-10-CM | POA: Diagnosis not present

## 2019-08-18 DIAGNOSIS — Z20828 Contact with and (suspected) exposure to other viral communicable diseases: Secondary | ICD-10-CM | POA: Diagnosis not present

## 2019-08-18 DIAGNOSIS — Z1159 Encounter for screening for other viral diseases: Secondary | ICD-10-CM | POA: Diagnosis not present

## 2019-08-25 DIAGNOSIS — Z20828 Contact with and (suspected) exposure to other viral communicable diseases: Secondary | ICD-10-CM | POA: Diagnosis not present

## 2019-08-25 DIAGNOSIS — Z1159 Encounter for screening for other viral diseases: Secondary | ICD-10-CM | POA: Diagnosis not present

## 2019-09-01 DIAGNOSIS — Z20828 Contact with and (suspected) exposure to other viral communicable diseases: Secondary | ICD-10-CM | POA: Diagnosis not present

## 2019-09-01 DIAGNOSIS — Z1159 Encounter for screening for other viral diseases: Secondary | ICD-10-CM | POA: Diagnosis not present

## 2019-09-08 DIAGNOSIS — Z20828 Contact with and (suspected) exposure to other viral communicable diseases: Secondary | ICD-10-CM | POA: Diagnosis not present

## 2019-09-08 DIAGNOSIS — Z1159 Encounter for screening for other viral diseases: Secondary | ICD-10-CM | POA: Diagnosis not present

## 2019-09-09 DIAGNOSIS — E039 Hypothyroidism, unspecified: Secondary | ICD-10-CM | POA: Diagnosis not present

## 2019-09-09 DIAGNOSIS — I480 Paroxysmal atrial fibrillation: Secondary | ICD-10-CM | POA: Diagnosis not present

## 2019-09-09 DIAGNOSIS — H269 Unspecified cataract: Secondary | ICD-10-CM | POA: Diagnosis not present

## 2019-09-09 DIAGNOSIS — D509 Iron deficiency anemia, unspecified: Secondary | ICD-10-CM | POA: Diagnosis not present

## 2019-09-09 DIAGNOSIS — M810 Age-related osteoporosis without current pathological fracture: Secondary | ICD-10-CM | POA: Diagnosis not present

## 2019-09-09 DIAGNOSIS — E7800 Pure hypercholesterolemia, unspecified: Secondary | ICD-10-CM | POA: Diagnosis not present

## 2019-09-15 DIAGNOSIS — Z20828 Contact with and (suspected) exposure to other viral communicable diseases: Secondary | ICD-10-CM | POA: Diagnosis not present

## 2019-09-15 DIAGNOSIS — Z1159 Encounter for screening for other viral diseases: Secondary | ICD-10-CM | POA: Diagnosis not present

## 2019-09-22 DIAGNOSIS — Z20828 Contact with and (suspected) exposure to other viral communicable diseases: Secondary | ICD-10-CM | POA: Diagnosis not present

## 2019-09-22 DIAGNOSIS — Z1159 Encounter for screening for other viral diseases: Secondary | ICD-10-CM | POA: Diagnosis not present

## 2019-09-29 DIAGNOSIS — Z1159 Encounter for screening for other viral diseases: Secondary | ICD-10-CM | POA: Diagnosis not present

## 2019-09-29 DIAGNOSIS — Z20828 Contact with and (suspected) exposure to other viral communicable diseases: Secondary | ICD-10-CM | POA: Diagnosis not present

## 2019-10-09 ENCOUNTER — Other Ambulatory Visit: Payer: Self-pay

## 2019-10-09 ENCOUNTER — Ambulatory Visit: Payer: Medicare HMO | Admitting: Cardiovascular Disease

## 2019-10-09 ENCOUNTER — Encounter: Payer: Self-pay | Admitting: Cardiovascular Disease

## 2019-10-09 VITALS — BP 140/78 | HR 91 | Ht 60.0 in | Wt 112.0 lb

## 2019-10-09 DIAGNOSIS — M4014 Other secondary kyphosis, thoracic region: Secondary | ICD-10-CM | POA: Diagnosis not present

## 2019-10-09 DIAGNOSIS — Z8679 Personal history of other diseases of the circulatory system: Secondary | ICD-10-CM

## 2019-10-09 DIAGNOSIS — H3530 Unspecified macular degeneration: Secondary | ICD-10-CM | POA: Diagnosis not present

## 2019-10-09 DIAGNOSIS — I493 Ventricular premature depolarization: Secondary | ICD-10-CM | POA: Diagnosis not present

## 2019-10-09 NOTE — Progress Notes (Signed)
Patient had Jill Mcdowell with cardiology   Cardiology Office Note    Date:  10/11/2019   ID:  Jill Mcdowell, DOB [birth year], MRN [id number]  PCP:  Jill Ruff, MD  Cardiologist:   Jill Klein, MD   Chief Complaint  Patient presents with  . Irregular Heart Beat    History of Present Illness:  Jill Mcdowell is a 86 y.o. female a remote history of a single episode of atrial fibrillation (reportedly occurred in the 1990s, no firm documentation available).   She is doing well without significant cardiovascular complaints.  She has rare palpitations that are never sustained.  She has not had angina, dyspnea, syncope, leg edema, claudication or focal neurological events.  Previously when she was complaining of palpitations she seems to have monomorphic PVCs with a right bundle branch block pattern. We tried switching from diltiazem to beta-blockers but these were not poorly tolerated because of visual symptoms.  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  Her biggest complaint centers around worsening kyphosis due to osteoporosis which reportedly has led to some kinking of her esophagus and some reflux with worsening cough.  Jill Mcdowell and her husband live in independent living habits with.  Jill Mcdowell memory is deteriorating and he does not like her to leave his side.   Past Medical History:  Diagnosis Date  . Arrhythmia 02/06/2012   hx of paroxysmal a fib with no recurrence,rare palpations  . Breast lump   . GERD (gastroesophageal reflux disease)   . Hypertension 2013   good control  . Kidney stones    history of  . Thyroid disease     Past Surgical History:  Procedure Laterality Date  . ABDOMINAL HYSTERECTOMY    . KYPHOPLASTY N/A 12/18/2018   Procedure: KYPHOPLASTY, THORACIC SEVEN;  Surgeon: Jill Pies, MD;  Location: [facility];  Service:  Neurosurgery;  Laterality: N/A;  KYPHOPLASTY, THORACIC 7  . NM MYOCAR PERF WALL MOTION  Feb 19 2008   negative for ischemia  . SUPRAVENTRICULAR TACHYCARDIA ABLATION      Current Medications: Outpatient Medications Prior to Visit  Medication Sig Dispense Refill  . acetaminophen (TYLENOL) 500 MG tablet Take 500 mg by mouth every 8 (eight) hours as needed for headache.     . Brimonidine Tartrate (LUMIFY) 0.025 % SOLN Apply 1 drop to eye daily.    . calcium carbonate (TUMS) 500 MG chewable tablet Chew 3 tablets by mouth daily as needed for indigestion or heartburn.     . Cholecalciferol (VITAMIN D-3) 5000 UNITS TABS Take 10,000 Units by mouth every Friday.     [name] DILT-XR 180 MG 24 hr capsule TAKE 1 CAPSULE EVERY DAY (Patient taking differently: Take 180 mg by mouth daily. ) 90 capsule 3  . HYDROcodone-acetaminophen (NORCO/VICODIN) 5-325 MG tablet Take 1 tablet by mouth every 6 (six) hours as needed for moderate pain.     . iron polysaccharides (NU-IRON) 150 MG capsule Take 150 mg by mouth 2 (two) times a week.     . levothyroxine (SYNTHROID, LEVOTHROID) 50 MCG tablet Take 50 mcg by mouth daily before breakfast.    . Multiple Vitamins-Minerals (PRESERVISION AREDS PO) Take 1 capsule by mouth 2 (two) times daily.     [name] omeprazole (PRILOSEC) 20 MG capsule Take 20 mg by mouth daily with lunch.     . polyethylene glycol powder (GLYCOLAX/MIRALAX) 17 GM/SCOOP powder Take 17 g by  mouth daily.    [name] tiZANidine (ZANAFLEX) 2 MG tablet Take 2 mg by mouth every 8 (eight) hours as needed for muscle spasms.      No facility-administered medications prior to visit.     Allergies:   Latex, Metoprolol, and Sulfa antibiotics   Social History   Socioeconomic History  . Marital status: Married    Spouse name: Not on file  . Number of children: Not on file  . Years of education: Not on file  . Highest education level: Not on file  Occupational History  . Not on file  Tobacco Use  . Smoking status: Former  Research scientist (life sciences)  . Smokeless tobacco: Never Used  Substance and Sexual Activity  . Alcohol use: No    Alcohol/week: 0.0 standard drinks  . Drug use: No  . Sexual activity: Not on file  Other Topics Concern  . Not on file  Social History Narrative  . Not on file   Social Determinants of Health   Financial Resource Strain:   . Difficulty of Paying Living Expenses:   Food Insecurity:   . Worried About Charity fundraiser in the Last Year:   . Arboriculturist in the Last Year:   Transportation Mcdowell:   . Film/video editor (Medical):   [name] Lack of Transportation (Non-Medical):   Physical Activity:   . Days of Exercise per Week:   . Minutes of Exercise per Session:   Stress:   . Feeling of Stress :   Social Connections:   . Frequency of Communication with Friends and Family:   . Frequency of Social Gatherings with Friends and Family:   . Attends Religious Services:   . Active Member of Clubs or Organizations:   . Attends Archivist Meetings:   [name] Marital Status:      Family History:  The patient's family history includes Cancer in her brother and mother; Stroke in her father.   ROS:   Please see the history of present illness.    ROS All other systems are reviewed and are negative.  PHYSICAL EXAM:   VS:  BP 140/78   Pulse 91   Ht 5' (1.524 m)   Wt 112 lb (50.8 kg)   SpO2 95%   BMI 21.87 kg/m     General: Alert, oriented x3, no distress, very lean.  Prominent thoracic kyphosis Head: no evidence of trauma, PERRL, EOMI, no exophtalmos or lid lag, no myxedema, no xanthelasma; normal ears, nose and oropharynx Neck: normal jugular venous pulsations and no hepatojugular reflux; brisk carotid pulses without delay and no carotid bruits Chest: clear to auscultation, no signs of consolidation by percussion or palpation, normal fremitus, symmetrical and full respiratory excursions Cardiovascular: normal position and quality of the apical impulse, regular rhythm, normal first  and second heart sounds, no murmurs, rubs or gallops Abdomen: no tenderness or distention, no masses by palpation, no abnormal pulsatility or arterial bruits, normal bowel sounds, no hepatosplenomegaly Extremities: no clubbing, cyanosis or edema; 2+ radial, ulnar and brachial pulses bilaterally; 2+ right femoral, posterior tibial and dorsalis pedis pulses; 2+ left femoral, posterior tibial and dorsalis pedis pulses; no subclavian or femoral bruits Neurological: grossly nonfocal Psych: Normal mood and affect   Wt Readings from Last 3 Encounters:  10/09/19 112 lb (50.8 kg)  12/18/18 118 lb 9.7 oz (53.8 kg)  12/13/18 114 lb (51.7 kg)      Studies/Labs Reviewed:   EKG:  EKG is ordered today.  It  shows sinus rhythm with a single PVC and is otherwise normal  Recent Labs: October 23, 2017 hemoglobin 14, normal liver function tests, TSH 0.76 Total cholesterol 206, HDL 57, LDL 124, triglycerides 123. 06/18/2019 Hemoglobin 14.1, normal liver function tests, TSH 1.14 Total cholesterol 177, HDL 61, LDL 107, triglycerides 102  ASSESSMENT:    1. PVCs (premature ventricular contractions)   2. History of atrial fibrillation   3. Macular degeneration of both eyes, unspecified type   4. Other secondary kyphosis, thoracic region      PLAN:  In order of problems listed above:  1. PAFib: Although this was reported in her chart, from documentation is not available and she has not had clinical atrial fibrillation in over 20 years.  Anticoagulation is not justified. 2. 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  Does not have any significant structural heart disease. 3. Macular degeneration: Her retina specialist is Dr. Posey Pronto. 4. Thoracic kyphosis/osteoporosis: She wonders whether this could directly impact her heart.  I told  her that I doubt it could do so, unless she develops severe restrictive lung disease and cor pulmonale.      Medication Adjustments/Labs and Tests Ordered: Current medicines are reviewed at length with the patient today.  Concerns regarding medicines are outlined above.  Medication changes, Labs and Tests ordered today are listed in the Patient Instructions below. Patient Instructions  Medication Instructions:  No changes *If you need a refill on your cardiac medications before your next appointment, please call your pharmacy*   Lab Work: None ordered If you have labs (blood work) drawn today and your tests are completely normal, you will receive your results only by: [name] MyChart Message (if you have MyChart) OR . A paper copy in the mail If you have any lab test that is abnormal or we need to change your treatment, we will call you to review the results.   Testing/Procedures: None ordered   Follow-Up: At [facility], you and your health Mcdowell are our priority.  As part of our continuing mission to provide you with exceptional heart care, we have created designated Provider Care Teams.  These Care Teams include your primary Cardiologist (physician) and Advanced Practice Providers (APPs -  Physician Assistants and Nurse Practitioners) who all work together to provide you with the care you need, when you need it.  We recommend signing up for the patient portal called "MyChart".  Sign up information is provided on this After Visit Summary.  MyChart is used to connect with patients for Virtual Visits (Telemedicine).  Patients are able to view lab/test results, encounter notes, upcoming appointments, etc.  Non-urgent messages can be sent to your provider as well.   To learn more about what you can do with MyChart, go to [email or web address].    Your next appointment:   12 month(s)  The format for your next appointment:   In Person  Provider:   Sanda Klein, MD       Signed, Jill Klein, MD  10/11/2019 5:26 PM    [address], [place], [place]  [address] Phone: [phone]; Fax: [phone]

## 2019-10-09 NOTE — Patient Instructions (Signed)

## 2019-10-11 ENCOUNTER — Encounter: Payer: Self-pay | Admitting: Cardiovascular Disease

## 2019-10-15 ENCOUNTER — Other Ambulatory Visit: Payer: Self-pay | Admitting: Cardiovascular Disease

## 2019-10-16 NOTE — Telephone Encounter (Signed)
Rx(s) sent to pharmacy electronically.  

## 2019-10-24 DIAGNOSIS — H524 Presbyopia: Secondary | ICD-10-CM | POA: Diagnosis not present

## 2019-10-24 DIAGNOSIS — Z961 Presence of intraocular lens: Secondary | ICD-10-CM | POA: Diagnosis not present

## 2019-10-24 DIAGNOSIS — H353132 Nonexudative age-related macular degeneration, bilateral, intermediate dry stage: Secondary | ICD-10-CM | POA: Diagnosis not present

## 2019-10-24 DIAGNOSIS — H04121 Dry eye syndrome of right lacrimal gland: Secondary | ICD-10-CM | POA: Diagnosis not present

## 2019-11-03 DIAGNOSIS — Z1159 Encounter for screening for other viral diseases: Secondary | ICD-10-CM | POA: Diagnosis not present

## 2019-11-03 DIAGNOSIS — Z20828 Contact with and (suspected) exposure to other viral communicable diseases: Secondary | ICD-10-CM | POA: Diagnosis not present

## 2019-11-10 DIAGNOSIS — Z20828 Contact with and (suspected) exposure to other viral communicable diseases: Secondary | ICD-10-CM | POA: Diagnosis not present

## 2019-11-10 DIAGNOSIS — Z1159 Encounter for screening for other viral diseases: Secondary | ICD-10-CM | POA: Diagnosis not present

## 2019-11-17 DIAGNOSIS — E7800 Pure hypercholesterolemia, unspecified: Secondary | ICD-10-CM | POA: Diagnosis not present

## 2019-11-17 DIAGNOSIS — R5381 Other malaise: Secondary | ICD-10-CM | POA: Diagnosis not present

## 2019-11-17 DIAGNOSIS — H9190 Unspecified hearing loss, unspecified ear: Secondary | ICD-10-CM | POA: Diagnosis not present

## 2019-11-17 DIAGNOSIS — I493 Ventricular premature depolarization: Secondary | ICD-10-CM | POA: Diagnosis not present

## 2019-11-17 DIAGNOSIS — E559 Vitamin D deficiency, unspecified: Secondary | ICD-10-CM | POA: Diagnosis not present

## 2019-11-17 DIAGNOSIS — I349 Nonrheumatic mitral valve disorder, unspecified: Secondary | ICD-10-CM | POA: Diagnosis not present

## 2019-11-17 DIAGNOSIS — F4320 Adjustment disorder, unspecified: Secondary | ICD-10-CM | POA: Diagnosis not present

## 2019-11-17 DIAGNOSIS — H9319 Tinnitus, unspecified ear: Secondary | ICD-10-CM | POA: Diagnosis not present

## 2019-11-17 DIAGNOSIS — I480 Paroxysmal atrial fibrillation: Secondary | ICD-10-CM | POA: Diagnosis not present

## 2019-11-17 DIAGNOSIS — R05 Cough: Secondary | ICD-10-CM | POA: Diagnosis not present

## 2019-11-17 DIAGNOSIS — D509 Iron deficiency anemia, unspecified: Secondary | ICD-10-CM | POA: Diagnosis not present

## 2019-11-17 DIAGNOSIS — E039 Hypothyroidism, unspecified: Secondary | ICD-10-CM | POA: Diagnosis not present

## 2019-11-21 DIAGNOSIS — I480 Paroxysmal atrial fibrillation: Secondary | ICD-10-CM | POA: Diagnosis not present

## 2019-11-21 DIAGNOSIS — E7800 Pure hypercholesterolemia, unspecified: Secondary | ICD-10-CM | POA: Diagnosis not present

## 2019-11-21 DIAGNOSIS — M810 Age-related osteoporosis without current pathological fracture: Secondary | ICD-10-CM | POA: Diagnosis not present

## 2019-11-21 DIAGNOSIS — H269 Unspecified cataract: Secondary | ICD-10-CM | POA: Diagnosis not present

## 2019-11-21 DIAGNOSIS — D509 Iron deficiency anemia, unspecified: Secondary | ICD-10-CM | POA: Diagnosis not present

## 2019-11-21 DIAGNOSIS — E039 Hypothyroidism, unspecified: Secondary | ICD-10-CM | POA: Diagnosis not present

## 2019-11-24 DIAGNOSIS — Z20828 Contact with and (suspected) exposure to other viral communicable diseases: Secondary | ICD-10-CM | POA: Diagnosis not present

## 2019-11-24 DIAGNOSIS — Z1159 Encounter for screening for other viral diseases: Secondary | ICD-10-CM | POA: Diagnosis not present

## 2019-11-28 DIAGNOSIS — H353132 Nonexudative age-related macular degeneration, bilateral, intermediate dry stage: Secondary | ICD-10-CM | POA: Diagnosis not present

## 2019-11-28 DIAGNOSIS — H35453 Secondary pigmentary degeneration, bilateral: Secondary | ICD-10-CM | POA: Diagnosis not present

## 2019-11-28 DIAGNOSIS — H35363 Drusen (degenerative) of macula, bilateral: Secondary | ICD-10-CM | POA: Diagnosis not present

## 2019-11-28 DIAGNOSIS — H35372 Puckering of macula, left eye: Secondary | ICD-10-CM | POA: Diagnosis not present

## 2019-11-28 DIAGNOSIS — H35342 Macular cyst, hole, or pseudohole, left eye: Secondary | ICD-10-CM | POA: Diagnosis not present

## 2019-11-28 DIAGNOSIS — Z961 Presence of intraocular lens: Secondary | ICD-10-CM | POA: Diagnosis not present

## 2019-11-28 DIAGNOSIS — H47393 Other disorders of optic disc, bilateral: Secondary | ICD-10-CM | POA: Diagnosis not present

## 2019-12-18 DIAGNOSIS — H93293 Other abnormal auditory perceptions, bilateral: Secondary | ICD-10-CM | POA: Diagnosis not present

## 2019-12-18 DIAGNOSIS — H6121 Impacted cerumen, right ear: Secondary | ICD-10-CM | POA: Diagnosis not present

## 2020-01-19 DIAGNOSIS — H903 Sensorineural hearing loss, bilateral: Secondary | ICD-10-CM | POA: Diagnosis not present

## 2020-02-18 DIAGNOSIS — I480 Paroxysmal atrial fibrillation: Secondary | ICD-10-CM | POA: Diagnosis not present

## 2020-02-18 DIAGNOSIS — M810 Age-related osteoporosis without current pathological fracture: Secondary | ICD-10-CM | POA: Diagnosis not present

## 2020-02-18 DIAGNOSIS — H269 Unspecified cataract: Secondary | ICD-10-CM | POA: Diagnosis not present

## 2020-02-18 DIAGNOSIS — E7800 Pure hypercholesterolemia, unspecified: Secondary | ICD-10-CM | POA: Diagnosis not present

## 2020-02-18 DIAGNOSIS — E039 Hypothyroidism, unspecified: Secondary | ICD-10-CM | POA: Diagnosis not present

## 2020-02-18 DIAGNOSIS — D509 Iron deficiency anemia, unspecified: Secondary | ICD-10-CM | POA: Diagnosis not present

## 2020-02-23 DIAGNOSIS — Z1159 Encounter for screening for other viral diseases: Secondary | ICD-10-CM | POA: Diagnosis not present

## 2020-02-23 DIAGNOSIS — Z20828 Contact with and (suspected) exposure to other viral communicable diseases: Secondary | ICD-10-CM | POA: Diagnosis not present

## 2020-03-01 DIAGNOSIS — Z1159 Encounter for screening for other viral diseases: Secondary | ICD-10-CM | POA: Diagnosis not present

## 2020-03-01 DIAGNOSIS — Z20828 Contact with and (suspected) exposure to other viral communicable diseases: Secondary | ICD-10-CM | POA: Diagnosis not present

## 2020-03-08 DIAGNOSIS — Z1159 Encounter for screening for other viral diseases: Secondary | ICD-10-CM | POA: Diagnosis not present

## 2020-03-08 DIAGNOSIS — Z20828 Contact with and (suspected) exposure to other viral communicable diseases: Secondary | ICD-10-CM | POA: Diagnosis not present

## 2020-03-15 DIAGNOSIS — Z1159 Encounter for screening for other viral diseases: Secondary | ICD-10-CM | POA: Diagnosis not present

## 2020-03-15 DIAGNOSIS — Z20828 Contact with and (suspected) exposure to other viral communicable diseases: Secondary | ICD-10-CM | POA: Diagnosis not present

## 2020-03-17 ENCOUNTER — Ambulatory Visit: Payer: Medicare HMO | Admitting: Dermatology

## 2020-03-22 DIAGNOSIS — Z1159 Encounter for screening for other viral diseases: Secondary | ICD-10-CM | POA: Diagnosis not present

## 2020-03-22 DIAGNOSIS — Z20828 Contact with and (suspected) exposure to other viral communicable diseases: Secondary | ICD-10-CM | POA: Diagnosis not present

## 2020-04-19 DIAGNOSIS — Z20828 Contact with and (suspected) exposure to other viral communicable diseases: Secondary | ICD-10-CM | POA: Diagnosis not present

## 2020-04-19 DIAGNOSIS — Z1159 Encounter for screening for other viral diseases: Secondary | ICD-10-CM | POA: Diagnosis not present

## 2020-04-26 DIAGNOSIS — Z20828 Contact with and (suspected) exposure to other viral communicable diseases: Secondary | ICD-10-CM | POA: Diagnosis not present

## 2020-04-26 DIAGNOSIS — Z1159 Encounter for screening for other viral diseases: Secondary | ICD-10-CM | POA: Diagnosis not present

## 2020-05-03 DIAGNOSIS — Z1159 Encounter for screening for other viral diseases: Secondary | ICD-10-CM | POA: Diagnosis not present

## 2020-05-03 DIAGNOSIS — Z20828 Contact with and (suspected) exposure to other viral communicable diseases: Secondary | ICD-10-CM | POA: Diagnosis not present

## 2020-05-10 DIAGNOSIS — Z20828 Contact with and (suspected) exposure to other viral communicable diseases: Secondary | ICD-10-CM | POA: Diagnosis not present

## 2020-05-10 DIAGNOSIS — Z1159 Encounter for screening for other viral diseases: Secondary | ICD-10-CM | POA: Diagnosis not present

## 2020-05-17 DIAGNOSIS — E039 Hypothyroidism, unspecified: Secondary | ICD-10-CM | POA: Diagnosis not present

## 2020-05-17 DIAGNOSIS — Z20828 Contact with and (suspected) exposure to other viral communicable diseases: Secondary | ICD-10-CM | POA: Diagnosis not present

## 2020-05-17 DIAGNOSIS — H269 Unspecified cataract: Secondary | ICD-10-CM | POA: Diagnosis not present

## 2020-05-17 DIAGNOSIS — K219 Gastro-esophageal reflux disease without esophagitis: Secondary | ICD-10-CM | POA: Diagnosis not present

## 2020-05-17 DIAGNOSIS — M810 Age-related osteoporosis without current pathological fracture: Secondary | ICD-10-CM | POA: Diagnosis not present

## 2020-05-17 DIAGNOSIS — I480 Paroxysmal atrial fibrillation: Secondary | ICD-10-CM | POA: Diagnosis not present

## 2020-05-17 DIAGNOSIS — D509 Iron deficiency anemia, unspecified: Secondary | ICD-10-CM | POA: Diagnosis not present

## 2020-05-17 DIAGNOSIS — E7800 Pure hypercholesterolemia, unspecified: Secondary | ICD-10-CM | POA: Diagnosis not present

## 2020-05-17 DIAGNOSIS — Z1159 Encounter for screening for other viral diseases: Secondary | ICD-10-CM | POA: Diagnosis not present

## 2020-05-19 ENCOUNTER — Ambulatory Visit: Payer: Medicare HMO | Admitting: Dermatology

## 2020-05-24 DIAGNOSIS — Z1159 Encounter for screening for other viral diseases: Secondary | ICD-10-CM | POA: Diagnosis not present

## 2020-05-24 DIAGNOSIS — Z20828 Contact with and (suspected) exposure to other viral communicable diseases: Secondary | ICD-10-CM | POA: Diagnosis not present

## 2020-05-27 DIAGNOSIS — I493 Ventricular premature depolarization: Secondary | ICD-10-CM | POA: Diagnosis not present

## 2020-05-27 DIAGNOSIS — D509 Iron deficiency anemia, unspecified: Secondary | ICD-10-CM | POA: Diagnosis not present

## 2020-05-27 DIAGNOSIS — K219 Gastro-esophageal reflux disease without esophagitis: Secondary | ICD-10-CM | POA: Diagnosis not present

## 2020-05-27 DIAGNOSIS — M810 Age-related osteoporosis without current pathological fracture: Secondary | ICD-10-CM | POA: Diagnosis not present

## 2020-05-27 DIAGNOSIS — E559 Vitamin D deficiency, unspecified: Secondary | ICD-10-CM | POA: Diagnosis not present

## 2020-05-27 DIAGNOSIS — E039 Hypothyroidism, unspecified: Secondary | ICD-10-CM | POA: Diagnosis not present

## 2020-05-27 DIAGNOSIS — E7800 Pure hypercholesterolemia, unspecified: Secondary | ICD-10-CM | POA: Diagnosis not present

## 2020-05-31 DIAGNOSIS — Z20828 Contact with and (suspected) exposure to other viral communicable diseases: Secondary | ICD-10-CM | POA: Diagnosis not present

## 2020-05-31 DIAGNOSIS — Z1159 Encounter for screening for other viral diseases: Secondary | ICD-10-CM | POA: Diagnosis not present

## 2020-06-07 DIAGNOSIS — H35372 Puckering of macula, left eye: Secondary | ICD-10-CM | POA: Diagnosis not present

## 2020-06-07 DIAGNOSIS — H35363 Drusen (degenerative) of macula, bilateral: Secondary | ICD-10-CM | POA: Diagnosis not present

## 2020-06-07 DIAGNOSIS — H35453 Secondary pigmentary degeneration, bilateral: Secondary | ICD-10-CM | POA: Diagnosis not present

## 2020-06-07 DIAGNOSIS — H353132 Nonexudative age-related macular degeneration, bilateral, intermediate dry stage: Secondary | ICD-10-CM | POA: Diagnosis not present

## 2020-06-07 DIAGNOSIS — H35342 Macular cyst, hole, or pseudohole, left eye: Secondary | ICD-10-CM | POA: Diagnosis not present

## 2020-06-07 DIAGNOSIS — Z961 Presence of intraocular lens: Secondary | ICD-10-CM | POA: Diagnosis not present

## 2020-06-07 DIAGNOSIS — H33192 Other retinoschisis and retinal cysts, left eye: Secondary | ICD-10-CM | POA: Diagnosis not present

## 2020-06-08 ENCOUNTER — Ambulatory Visit (INDEPENDENT_AMBULATORY_CARE_PROVIDER_SITE_OTHER): Payer: Medicare HMO | Admitting: Dermatology

## 2020-06-08 ENCOUNTER — Other Ambulatory Visit: Payer: Self-pay

## 2020-06-08 ENCOUNTER — Encounter: Payer: Self-pay | Admitting: Dermatology

## 2020-06-08 DIAGNOSIS — L821 Other seborrheic keratosis: Secondary | ICD-10-CM | POA: Diagnosis not present

## 2020-06-08 DIAGNOSIS — L814 Other melanin hyperpigmentation: Secondary | ICD-10-CM

## 2020-06-12 ENCOUNTER — Encounter: Payer: Self-pay | Admitting: Dermatology

## 2020-06-12 NOTE — Progress Notes (Signed)
   Follow-Up Visit   Subjective  Jill Mcdowell is a 86 y.o. female who presents for the following: Skin Problem (Left tip of nose x 1 year- "dark & forehead- scaly spots).  Discoloration Location:  Duration: Left tip of nose for 1 year Quality:  Associated Signs/Symptoms: Modifying Factors:  Severity:  Timing: Context: Would also like rest of face back check.  Objective  Well appearing patient in no apparent distress; mood and affect are within normal limits. Objective  Left Supratip of Nose: For millimeter monochrome symmetric pink-tan macule; dermoscopy shows no atypical features.  Images    Objective  Left Forehead, Right Forehead: Dozen plus subtle tan flattopped textured 4 to 10 mm papules   All sun exposed areas plus back examined.   Assessment & Plan    Lentigo Left Supratip of Nose  Photo taken patient can follow up in 1 year if stable.  Seborrheic keratosis (2) Left Forehead; Right Forehead  Patient is okay with my advice to leave these as long as they are clinically stable.     I, Lavonna Monarch, MD, have reviewed all documentation for this visit.  The documentation on 06/12/20 for the exam, diagnosis, procedures, and orders are all accurate and complete.

## 2020-06-14 DIAGNOSIS — Z1159 Encounter for screening for other viral diseases: Secondary | ICD-10-CM | POA: Diagnosis not present

## 2020-06-14 DIAGNOSIS — J302 Other seasonal allergic rhinitis: Secondary | ICD-10-CM | POA: Diagnosis not present

## 2020-06-14 DIAGNOSIS — Z20828 Contact with and (suspected) exposure to other viral communicable diseases: Secondary | ICD-10-CM | POA: Diagnosis not present

## 2020-06-21 DIAGNOSIS — Z20828 Contact with and (suspected) exposure to other viral communicable diseases: Secondary | ICD-10-CM | POA: Diagnosis not present

## 2020-06-21 DIAGNOSIS — Z1159 Encounter for screening for other viral diseases: Secondary | ICD-10-CM | POA: Diagnosis not present

## 2020-06-28 DIAGNOSIS — Z20828 Contact with and (suspected) exposure to other viral communicable diseases: Secondary | ICD-10-CM | POA: Diagnosis not present

## 2020-06-28 DIAGNOSIS — Z1159 Encounter for screening for other viral diseases: Secondary | ICD-10-CM | POA: Diagnosis not present

## 2020-07-12 DIAGNOSIS — Z20828 Contact with and (suspected) exposure to other viral communicable diseases: Secondary | ICD-10-CM | POA: Diagnosis not present

## 2020-07-12 DIAGNOSIS — Z1159 Encounter for screening for other viral diseases: Secondary | ICD-10-CM | POA: Diagnosis not present

## 2020-07-19 ENCOUNTER — Other Ambulatory Visit: Payer: Self-pay | Admitting: Cardiovascular Disease

## 2020-07-19 DIAGNOSIS — Z1159 Encounter for screening for other viral diseases: Secondary | ICD-10-CM | POA: Diagnosis not present

## 2020-07-19 DIAGNOSIS — Z20828 Contact with and (suspected) exposure to other viral communicable diseases: Secondary | ICD-10-CM | POA: Diagnosis not present

## 2020-07-19 IMAGING — CR CHEST - 2 VIEW
1 series · 1 of 1 positions shown · non-contrast
Comparison: PA and lateral chest 09/19/2015.

CLINICAL DATA: Shortness of breath.

EXAM:
CHEST - 2 VIEW

[w chest pa]
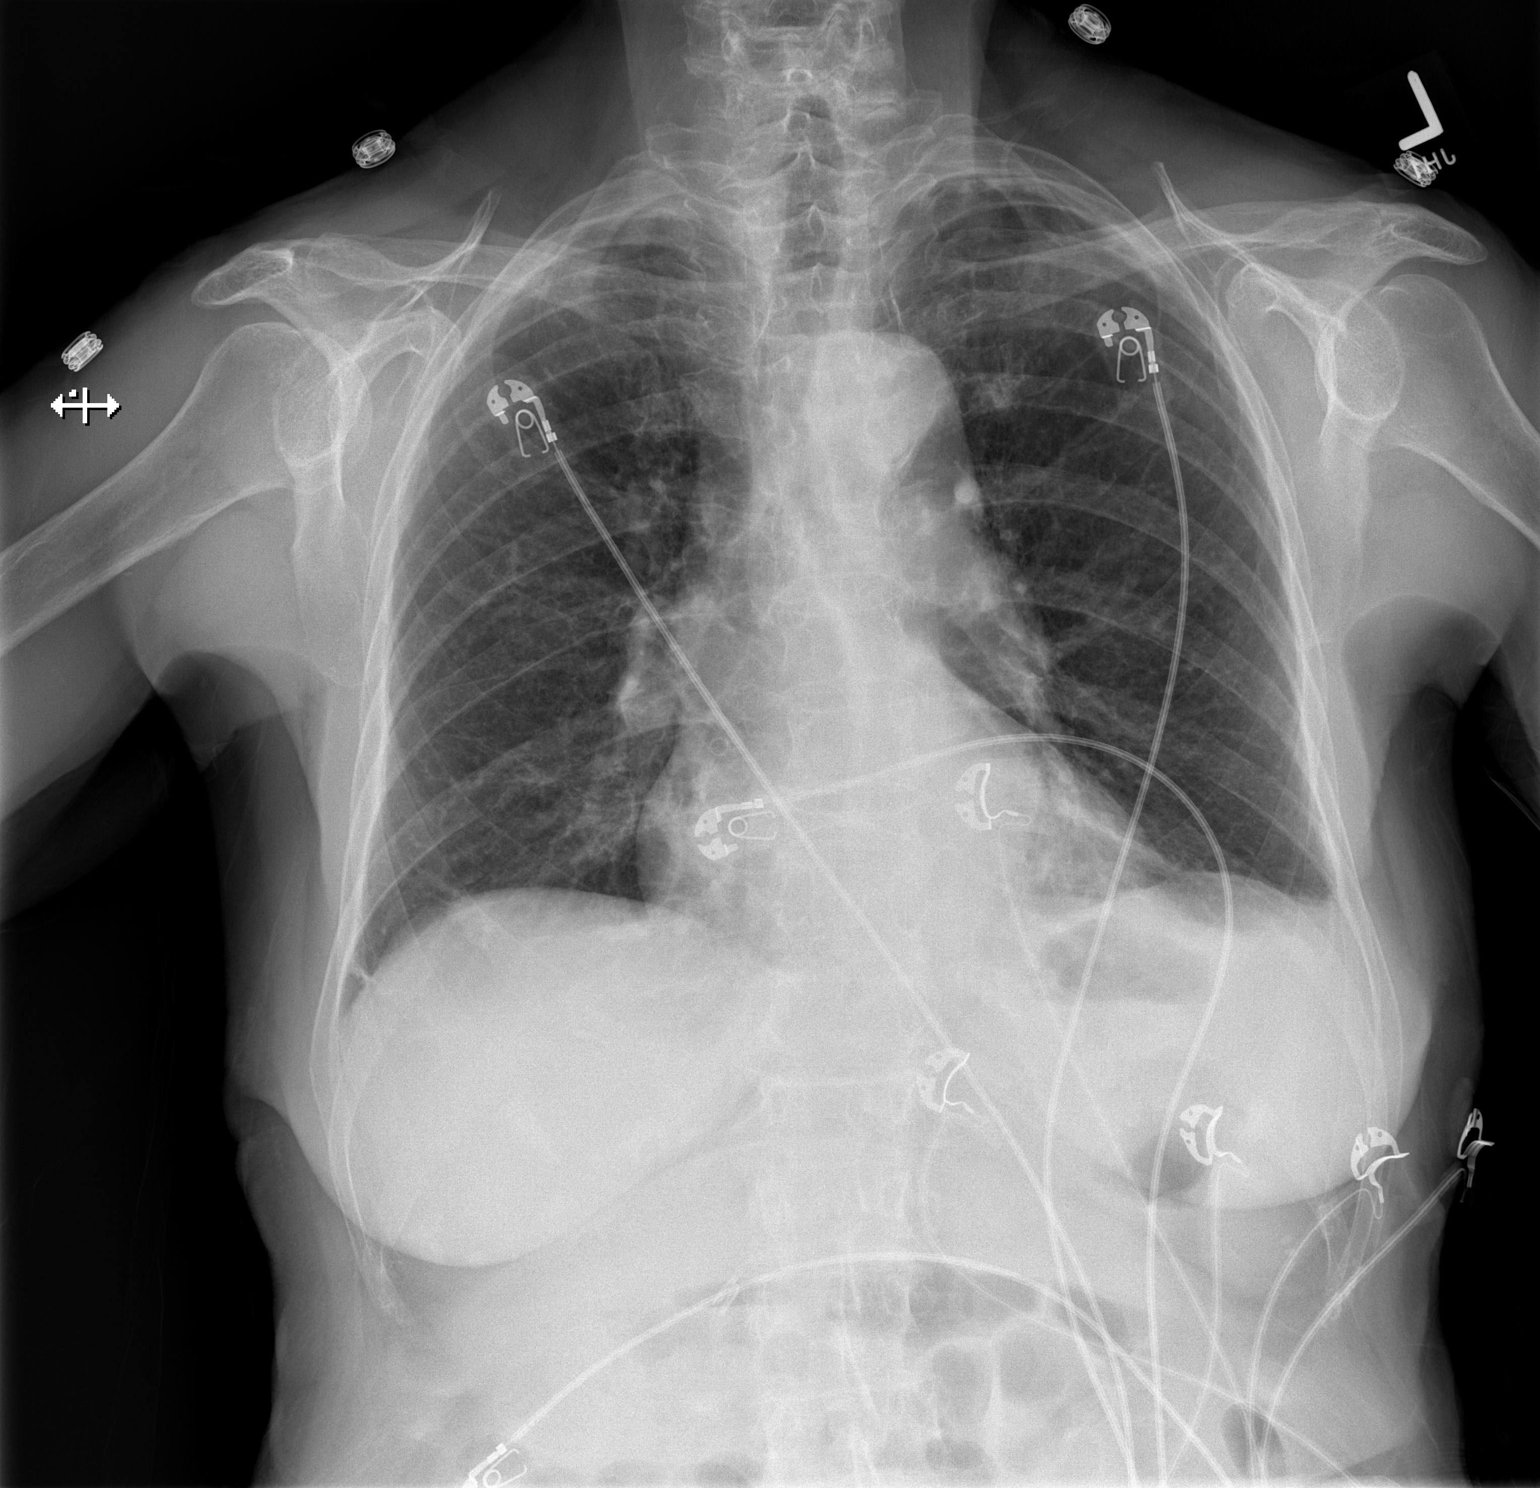

[1 of 1 positions shown; findings below may reference images not displayed]

FINDINGS: The lungs are clear. No pneumothorax or pleural effusion. Heart size
is normal. Aortic atherosclerosis is noted. No acute bony
abnormality. Remote T10, T12 and L1 compression fractures noted.
IMPRESSION: No acute disease.

## 2020-07-20 IMAGING — CT CT THORACIC SPINE WITHOUT CONTRAST
4 of 6 series · 14 of 33 positions shown, 16 images · non-contrast
Comparison: MRI thoracic spine 09/19/2015

CLINICAL DATA: Severe back pain since [REDACTED]. Injured while trying
to help husband from falling.

EXAM:
CT THORACIC SPINE WITHOUT CONTRAST
TECHNIQUE: Multidetector CT images of the thoracic were obtained using the
standard protocol without intravenous contrast.

[Series 4: t spine st · axial · 0.41mm/px · z∈[-250,-106]mm · 4 of 122 slices shown, 5 images]
[im 25/122  soft-tissue]
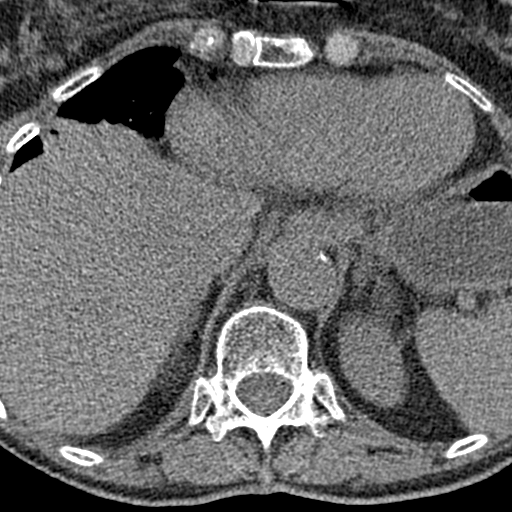
[im 25/122  bone]
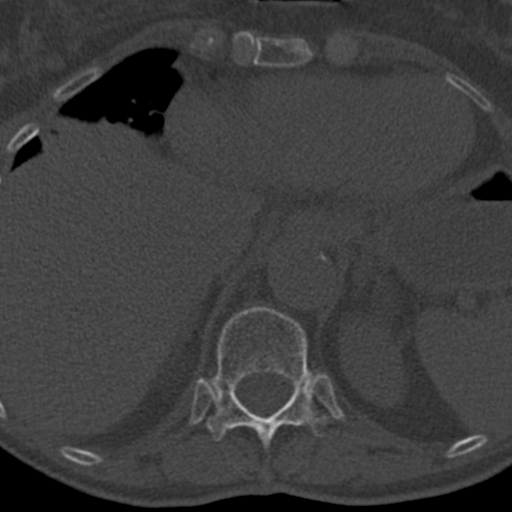
[im 49/122  bone]
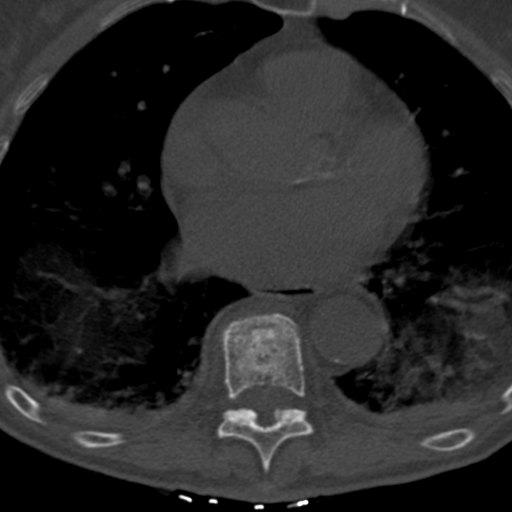
[im 73/122  bone]
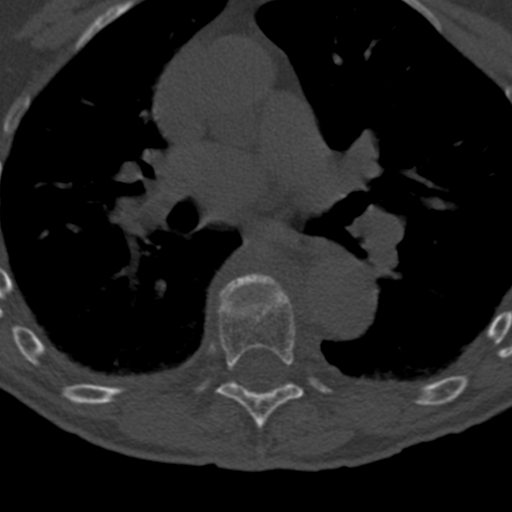
[im 97/122  bone]
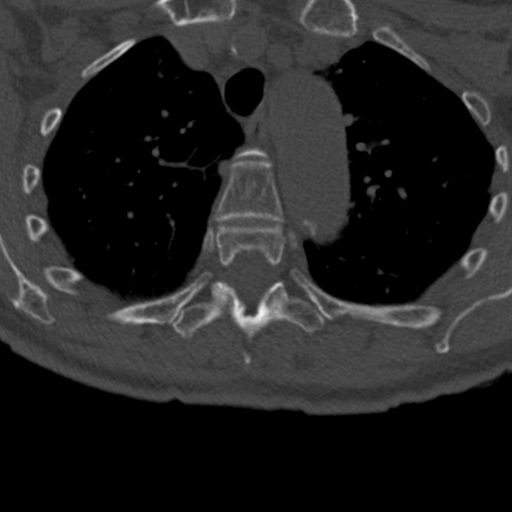

[Series 10: sagittal st · sagittal · 0.44mm/px · 5 of 51 slices shown, 6 images]
[im 17/51  bone]
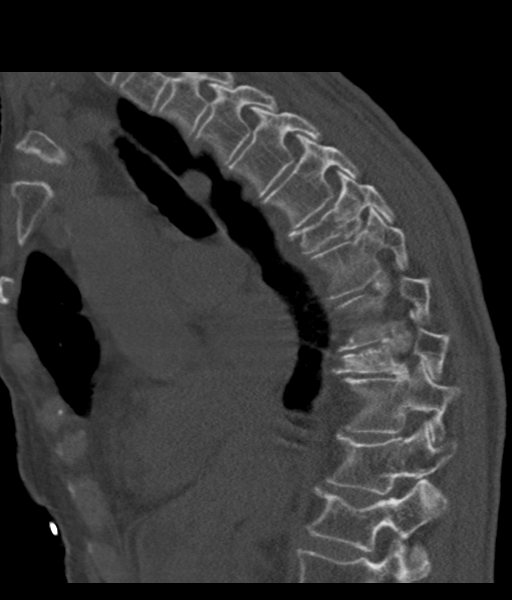
[im 21/51  bone]
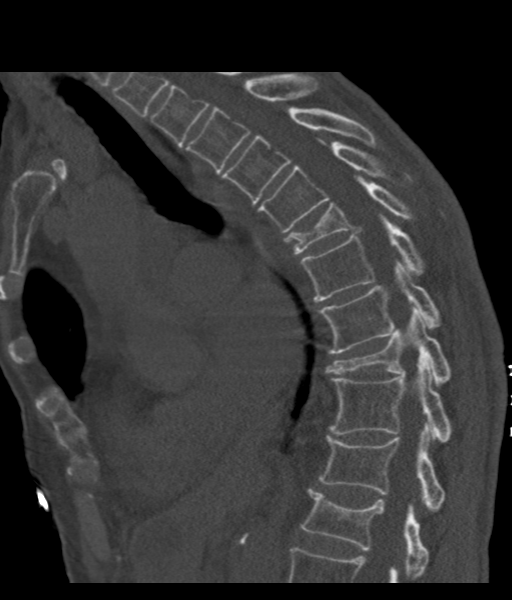
[im 26/51  soft-tissue]
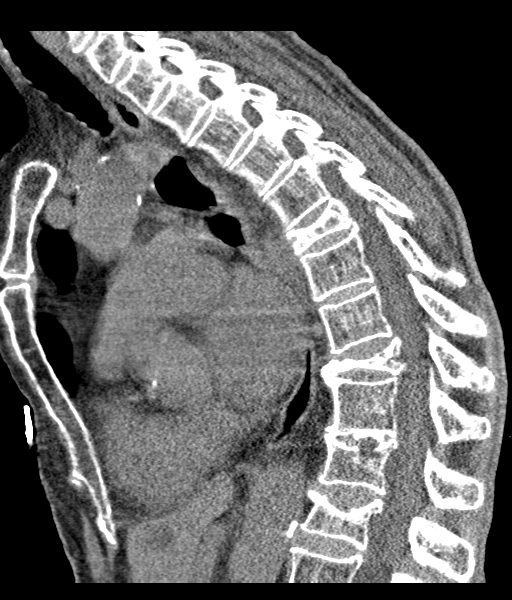
[im 26/51  bone]
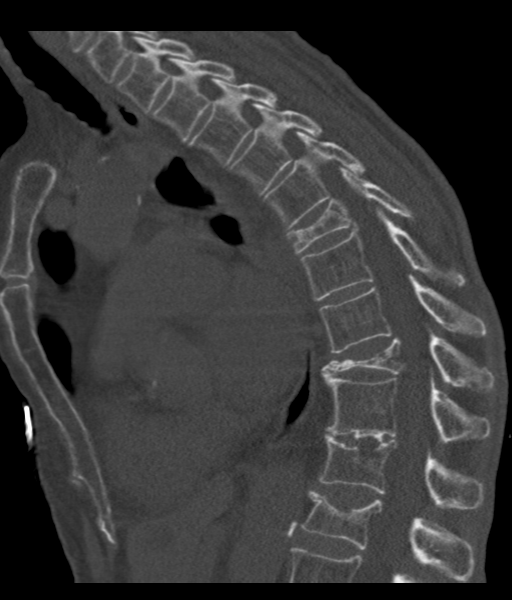
[im 30/51  bone]
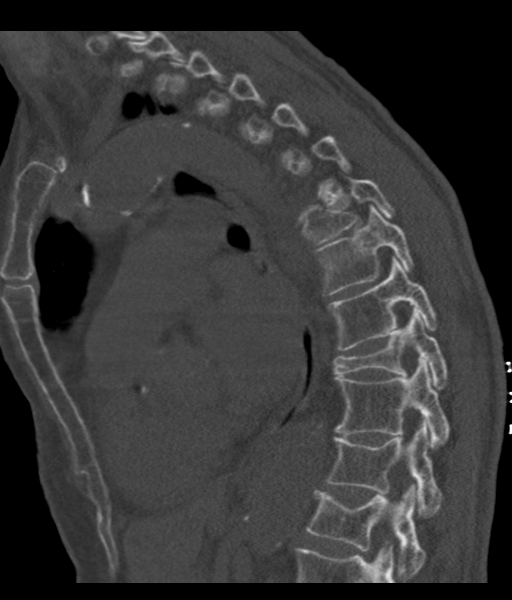
[im 34/51  bone]
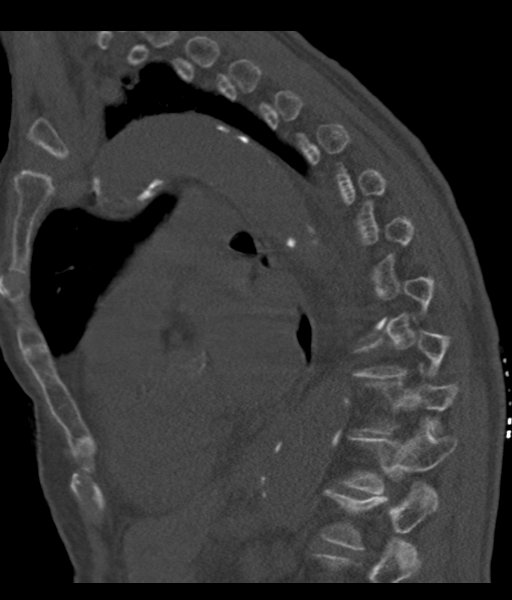

[Series 11: axial bone · axial · 0.29mm/px · z∈[-249,-155]mm · 3 of 95 slices shown]
[im 24/95  bone]
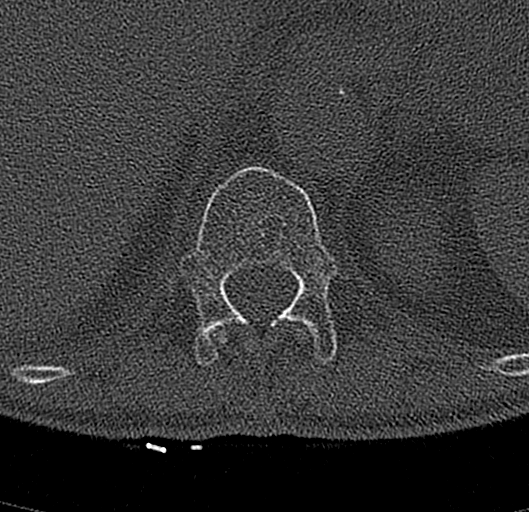
[im 48/95  bone]
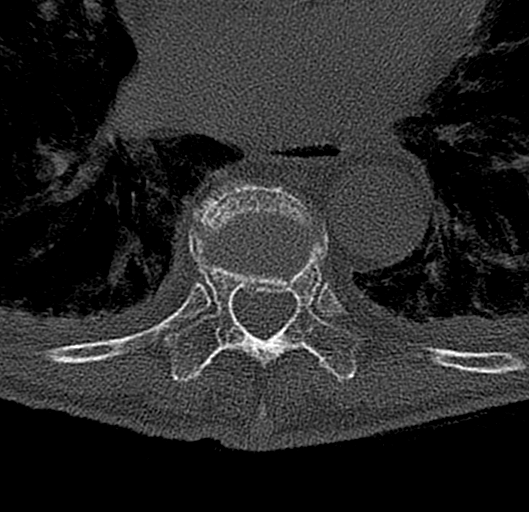
[im 71/95  bone]
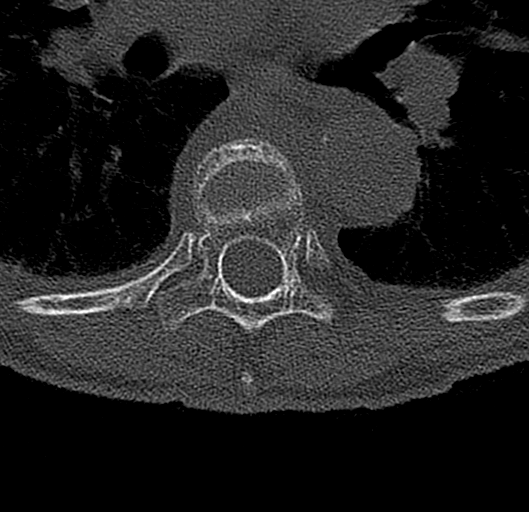

[Series 12: coronal bone · coronal · 0.31mm/px · 2 of 63 slices shown]
[im 21/63  bone]
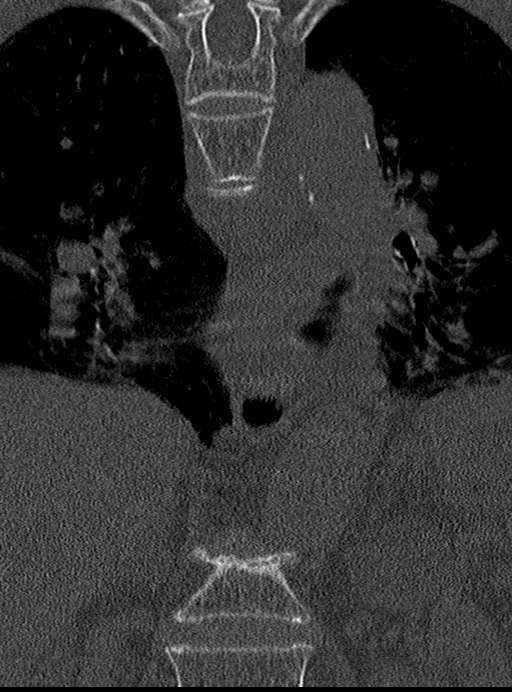
[im 42/63  bone]
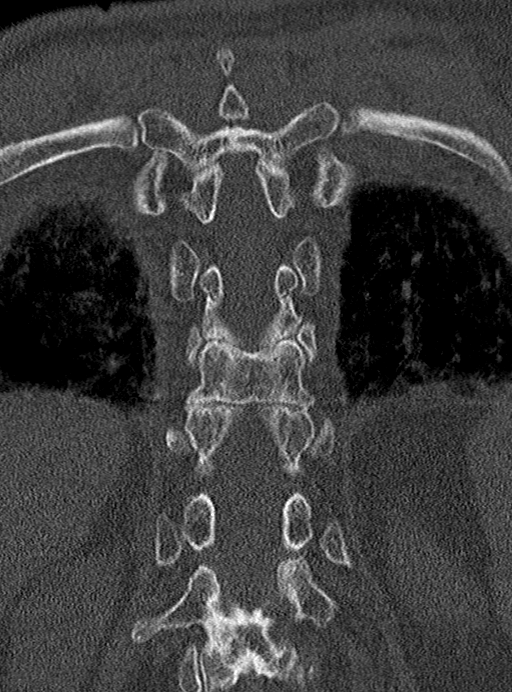

[14 of 33 positions shown; findings below may reference images not displayed]

FINDINGS: Alignment: Exaggerated thoracic kyphosis but the vertebral bodies
are normally aligned.

Vertebrae: There is a remote T10 compression fracture, markedly
progressive since 3730 with vertebral plana appearance. Mild
retropulsion but no significant canal compromise.

Acute appearing severe compression fracture of T7 with minimal
retropulsion but no canal compromise. Compression is estimated at
60% and there is paraspinal hematoma.

Compression fracture of L1 involving the superior endplate. This was
not present on prior lumbar radiographs from 02/18/2018. I do not
see any obvious paraspinal hematoma and I do not think this is an
acute fracture.

Paraspinal and other soft tissues: Paraspinal hematoma noted at T7.

There are small bilateral pleural effusions and bibasilar
atelectasis. Aortic calcifications without definite aneurysm. The
visualized posterior ribs are intact.

Disc levels:

Minimal retropulsion at T7 mainly involving the inferior aspect of
the vertebral body. There also appears to be a faintly calcified
disc protrusion but the spinal canal is generous in is no
significant spinal stenosis.

Mild retropulsion at T10 with mild canal encroachment and mild
spinal stenosis.

Mild retropulsion of the posterosuperior aspect of L1 with mild
canal encroachment but no significant stenosis.

No spinal canal hematoma
IMPRESSION: 1. Acute appearing and severe compression fracture of T7 with
paraspinal hematoma. No significant retropulsion or canal
compromise.
2. Remote but progressive T10 compression fracture. No significant
change since lumbar radiographs from 7681. Mild retropulsion and
mild canal compromise.
3. Indeterminate age L1 superior endplate fracture with mild
retropulsion.
[DATE] be helpful for further evaluation.

## 2020-07-26 DIAGNOSIS — D509 Iron deficiency anemia, unspecified: Secondary | ICD-10-CM | POA: Diagnosis not present

## 2020-07-26 DIAGNOSIS — K219 Gastro-esophageal reflux disease without esophagitis: Secondary | ICD-10-CM | POA: Diagnosis not present

## 2020-07-26 DIAGNOSIS — E7800 Pure hypercholesterolemia, unspecified: Secondary | ICD-10-CM | POA: Diagnosis not present

## 2020-07-26 DIAGNOSIS — Z20828 Contact with and (suspected) exposure to other viral communicable diseases: Secondary | ICD-10-CM | POA: Diagnosis not present

## 2020-07-26 DIAGNOSIS — M810 Age-related osteoporosis without current pathological fracture: Secondary | ICD-10-CM | POA: Diagnosis not present

## 2020-07-26 DIAGNOSIS — H269 Unspecified cataract: Secondary | ICD-10-CM | POA: Diagnosis not present

## 2020-07-26 DIAGNOSIS — Z1159 Encounter for screening for other viral diseases: Secondary | ICD-10-CM | POA: Diagnosis not present

## 2020-07-26 DIAGNOSIS — E039 Hypothyroidism, unspecified: Secondary | ICD-10-CM | POA: Diagnosis not present

## 2020-07-26 DIAGNOSIS — I480 Paroxysmal atrial fibrillation: Secondary | ICD-10-CM | POA: Diagnosis not present

## 2020-08-02 DIAGNOSIS — Z20828 Contact with and (suspected) exposure to other viral communicable diseases: Secondary | ICD-10-CM | POA: Diagnosis not present

## 2020-08-02 DIAGNOSIS — Z1159 Encounter for screening for other viral diseases: Secondary | ICD-10-CM | POA: Diagnosis not present

## 2020-08-09 DIAGNOSIS — Z20828 Contact with and (suspected) exposure to other viral communicable diseases: Secondary | ICD-10-CM | POA: Diagnosis not present

## 2020-08-09 DIAGNOSIS — Z1159 Encounter for screening for other viral diseases: Secondary | ICD-10-CM | POA: Diagnosis not present

## 2020-08-16 DIAGNOSIS — Z1159 Encounter for screening for other viral diseases: Secondary | ICD-10-CM | POA: Diagnosis not present

## 2020-08-16 DIAGNOSIS — Z20828 Contact with and (suspected) exposure to other viral communicable diseases: Secondary | ICD-10-CM | POA: Diagnosis not present

## 2020-08-23 DIAGNOSIS — Z1159 Encounter for screening for other viral diseases: Secondary | ICD-10-CM | POA: Diagnosis not present

## 2020-08-23 DIAGNOSIS — Z20828 Contact with and (suspected) exposure to other viral communicable diseases: Secondary | ICD-10-CM | POA: Diagnosis not present

## 2020-08-24 DIAGNOSIS — Z741 Need for assistance with personal care: Secondary | ICD-10-CM | POA: Diagnosis not present

## 2020-08-24 DIAGNOSIS — K5900 Constipation, unspecified: Secondary | ICD-10-CM | POA: Diagnosis not present

## 2020-08-24 DIAGNOSIS — M419 Scoliosis, unspecified: Secondary | ICD-10-CM | POA: Diagnosis not present

## 2020-08-24 DIAGNOSIS — D509 Iron deficiency anemia, unspecified: Secondary | ICD-10-CM | POA: Diagnosis not present

## 2020-08-24 DIAGNOSIS — R5381 Other malaise: Secondary | ICD-10-CM | POA: Diagnosis not present

## 2020-08-30 DIAGNOSIS — Z1159 Encounter for screening for other viral diseases: Secondary | ICD-10-CM | POA: Diagnosis not present

## 2020-08-30 DIAGNOSIS — Z20828 Contact with and (suspected) exposure to other viral communicable diseases: Secondary | ICD-10-CM | POA: Diagnosis not present

## 2020-09-06 DIAGNOSIS — Z1159 Encounter for screening for other viral diseases: Secondary | ICD-10-CM | POA: Diagnosis not present

## 2020-09-06 DIAGNOSIS — Z20828 Contact with and (suspected) exposure to other viral communicable diseases: Secondary | ICD-10-CM | POA: Diagnosis not present

## 2020-09-13 DIAGNOSIS — Z1159 Encounter for screening for other viral diseases: Secondary | ICD-10-CM | POA: Diagnosis not present

## 2020-09-13 DIAGNOSIS — Z20828 Contact with and (suspected) exposure to other viral communicable diseases: Secondary | ICD-10-CM | POA: Diagnosis not present

## 2020-09-20 DIAGNOSIS — Z1159 Encounter for screening for other viral diseases: Secondary | ICD-10-CM | POA: Diagnosis not present

## 2020-09-20 DIAGNOSIS — Z20828 Contact with and (suspected) exposure to other viral communicable diseases: Secondary | ICD-10-CM | POA: Diagnosis not present

## 2020-09-24 ENCOUNTER — Other Ambulatory Visit: Payer: Self-pay | Admitting: Cardiovascular Disease

## 2020-09-27 DIAGNOSIS — Z20828 Contact with and (suspected) exposure to other viral communicable diseases: Secondary | ICD-10-CM | POA: Diagnosis not present

## 2020-09-27 DIAGNOSIS — Z1159 Encounter for screening for other viral diseases: Secondary | ICD-10-CM | POA: Diagnosis not present

## 2020-10-04 DIAGNOSIS — Z1159 Encounter for screening for other viral diseases: Secondary | ICD-10-CM | POA: Diagnosis not present

## 2020-10-04 DIAGNOSIS — Z20828 Contact with and (suspected) exposure to other viral communicable diseases: Secondary | ICD-10-CM | POA: Diagnosis not present

## 2020-10-11 DIAGNOSIS — Z20828 Contact with and (suspected) exposure to other viral communicable diseases: Secondary | ICD-10-CM | POA: Diagnosis not present

## 2020-10-11 DIAGNOSIS — Z1159 Encounter for screening for other viral diseases: Secondary | ICD-10-CM | POA: Diagnosis not present

## 2020-10-13 DIAGNOSIS — E039 Hypothyroidism, unspecified: Secondary | ICD-10-CM | POA: Diagnosis not present

## 2020-10-13 DIAGNOSIS — E7800 Pure hypercholesterolemia, unspecified: Secondary | ICD-10-CM | POA: Diagnosis not present

## 2020-10-13 DIAGNOSIS — K219 Gastro-esophageal reflux disease without esophagitis: Secondary | ICD-10-CM | POA: Diagnosis not present

## 2020-10-13 DIAGNOSIS — H269 Unspecified cataract: Secondary | ICD-10-CM | POA: Diagnosis not present

## 2020-10-13 DIAGNOSIS — D509 Iron deficiency anemia, unspecified: Secondary | ICD-10-CM | POA: Diagnosis not present

## 2020-10-13 DIAGNOSIS — M810 Age-related osteoporosis without current pathological fracture: Secondary | ICD-10-CM | POA: Diagnosis not present

## 2020-10-13 DIAGNOSIS — I480 Paroxysmal atrial fibrillation: Secondary | ICD-10-CM | POA: Diagnosis not present

## 2020-10-18 DIAGNOSIS — Z1159 Encounter for screening for other viral diseases: Secondary | ICD-10-CM | POA: Diagnosis not present

## 2020-10-18 DIAGNOSIS — Z20828 Contact with and (suspected) exposure to other viral communicable diseases: Secondary | ICD-10-CM | POA: Diagnosis not present

## 2020-10-25 DIAGNOSIS — Z1159 Encounter for screening for other viral diseases: Secondary | ICD-10-CM | POA: Diagnosis not present

## 2020-10-25 DIAGNOSIS — Z20828 Contact with and (suspected) exposure to other viral communicable diseases: Secondary | ICD-10-CM | POA: Diagnosis not present

## 2020-10-27 ENCOUNTER — Other Ambulatory Visit: Payer: Self-pay

## 2020-10-27 ENCOUNTER — Encounter: Payer: Self-pay | Admitting: Cardiovascular Disease

## 2020-10-27 ENCOUNTER — Ambulatory Visit: Payer: Medicare HMO | Admitting: Cardiovascular Disease

## 2020-10-27 VITALS — BP 124/72 | HR 86 | Ht 60.0 in | Wt 120.8 lb

## 2020-10-27 DIAGNOSIS — I493 Ventricular premature depolarization: Secondary | ICD-10-CM

## 2020-10-27 DIAGNOSIS — I480 Paroxysmal atrial fibrillation: Secondary | ICD-10-CM | POA: Diagnosis not present

## 2020-10-27 NOTE — Progress Notes (Signed)
Patient had [facility] with cardiology   Cardiology Office Note    Date:  10/27/2020   ID:  Jill Mcdowell, DOB 14-Sep-1926, MRN [id number]  PCP:  Aretta Nip, MD  Cardiologist:   Sanda Klein, MD   Chief Complaint  Patient presents with  . Palpitations         History of Present Illness:  Jill Mcdowell is a 87 y.o. female a remote history of a single episode of atrial fibrillation (reportedly occurred in the 1990s, no firm documentation available).   Its been roughly a year since her husband Rush Landmark passed away.  She continues to live at [facility].  Her son Merry Proud is visiting from [place] for Mother's Day.  She had to undergo back surgery several weeks ago.  She had problems with edema not just in her legs but in her hands and arms as well after surgery, but this is gradually subsided and was probably due to IV fluids.  She has noticed that her varicose veins are more obvious since surgery and has some ankle numbness bilaterally.  She denies dyspnea at rest or with activity, orthopnea, PND, chest pain at rest or with exertion, syncope, dizziness, claudication.  Her palpitations have been very well controlled.  She continues to have intermittent problems with cough which might be related to environmental allergies or acid reflux.  Iron supplements reduced to 3 times weekly due to constipation.  Last hemoglobin 15.4.  Previously when she was complaining of palpitations she seems to have monomorphic PVCs with a right bundle branch block pattern. We tried switching from diltiazem to beta-blockers but these were not poorly tolerated because of visual symptoms.  It sounds like she developed hallucinations consistent with Sherran Needs syndrome (she will see a stereotypical geometrical pattern or sometimes small female figures).   Past Medical History:  Diagnosis Date  . Arrhythmia 02/06/2012   hx of paroxysmal a fib with no recurrence,rare palpations  . Breast lump   . GERD  (gastroesophageal reflux disease)   . Hypertension 2013   good control  . Kidney stones    history of  . Thyroid disease     Past Surgical History:  Procedure Laterality Date  . ABDOMINAL HYSTERECTOMY    . KYPHOPLASTY N/A 12/18/2018   Procedure: KYPHOPLASTY, THORACIC SEVEN;  Surgeon: Newman Pies, MD;  Location: [place];  Service: Neurosurgery;  Laterality: N/A;  KYPHOPLASTY, THORACIC 7  . NM MYOCAR PERF WALL MOTION  Feb 19 2008   negative for ischemia  . SUPRAVENTRICULAR TACHYCARDIA ABLATION      Current Medications: Outpatient Medications Prior to Visit  Medication Sig Dispense Refill  . acetaminophen (TYLENOL) 500 MG tablet Take 500 mg by mouth every 8 (eight) hours as needed for headache.     . Brimonidine Tartrate (LUMIFY) 0.025 % SOLN Apply 1 drop to eye daily.    . calcium carbonate (TUMS) 500 MG chewable tablet Chew 3 tablets by mouth daily as needed for indigestion or heartburn.     . Cholecalciferol (VITAMIN D-3) 5000 UNITS TABS Take 10,000 Units by mouth every Friday.     [name] DILT-XR 180 MG 24 hr capsule TAKE 1 CAPSULE EVERY DAY 90 capsule 0  . HYDROcodone-acetaminophen (NORCO/VICODIN) 5-325 MG tablet Take 1 tablet by mouth every 6 (six) hours as needed for moderate pain.     . iron polysaccharides (NIFEREX) 150 MG capsule Take 150 mg by mouth 3 (three) times a week.    . levothyroxine (SYNTHROID, LEVOTHROID) 50  MCG tablet Take 50 mcg by mouth daily before breakfast.    . Multiple Vitamins-Minerals (PRESERVISION AREDS PO) Take 1 capsule by mouth 2 (two) times daily.     [name] omeprazole (PRILOSEC) 20 MG capsule Take 20 mg by mouth daily with lunch.     . polyethylene glycol powder (GLYCOLAX/MIRALAX) 17 GM/SCOOP powder Take 17 g by mouth daily.    [name] tiZANidine (ZANAFLEX) 2 MG tablet Take 2 mg by mouth every 8 (eight) hours as needed for muscle spasms.      No facility-administered medications prior to visit.     Allergies:   Latex, Metoprolol, and Sulfa antibiotics    Social History   Socioeconomic History  . Marital status: Married    Spouse name: Not on file  . Number of children: Not on file  . Years of education: Not on file  . Highest education level: Not on file  Occupational History  . Not on file  Tobacco Use  . Smoking status: Former Research scientist (life sciences)  . Smokeless tobacco: Never Used  Substance and Sexual Activity  . Alcohol use: No    Alcohol/week: 0.0 standard drinks  . Drug use: No  . Sexual activity: Not on file  Other Topics Concern  . Not on file  Social History Narrative  . Not on file   Social Determinants of Health   Financial Resource Strain: Not on file  Food Insecurity: Not on file  Transportation Needs: Not on file  Physical Activity: Not on file  Stress: Not on file  Social Connections: Not on file     Family History:  The patient's family history includes Cancer in her brother and mother; Stroke in her father.   ROS:   Please see the history of present illness.    ROS All other systems are reviewed and are negative.  PHYSICAL EXAM:   VS:  BP 124/72   Pulse 86   Ht 5' (1.524 m)   Wt 120 lb 12.8 oz (54.8 kg)   SpO2 95%   BMI 23.59 kg/m      General: Alert, oriented x3, no distress, very lean. Head: no evidence of trauma, PERRL, EOMI, no exophtalmos or lid lag, no myxedema, no xanthelasma; normal ears, nose and oropharynx Neck: normal jugular venous pulsations and no hepatojugular reflux; brisk carotid pulses without delay and no carotid bruits Chest: clear to auscultation, no signs of consolidation by percussion or palpation, normal fremitus, symmetrical and full respiratory excursions Cardiovascular: normal position and quality of the apical impulse, regular rhythm, normal first and second heart sounds, no murmurs, rubs or gallops Abdomen: no tenderness or distention, no masses by palpation, no abnormal pulsatility or arterial bruits, normal bowel sounds, no hepatosplenomegaly Extremities: no clubbing,  cyanosis or edema; 2+ radial, ulnar and brachial pulses bilaterally; 2+ right femoral, posterior tibial and dorsalis pedis pulses; 2+ left femoral, posterior tibial and dorsalis pedis pulses; no subclavian or femoral bruits Neurological: grossly nonfocal Psych: Normal mood and affect    Wt Readings from Last 3 Encounters:  10/27/20 120 lb 12.8 oz (54.8 kg)  10/09/19 112 lb (50.8 kg)  12/18/18 118 lb 9.7 oz (53.8 kg)      Studies/Labs Reviewed:   EKG:  EKG is ordered today.  It shows a QS pattern in leads V1-V2, likely due to high lead placement, otherwise normal.  Recent Labs: October 23, 2017 hemoglobin 14, normal liver function tests, TSH 0.76 Total cholesterol 206, HDL 57, LDL 124, triglycerides 123. 06/18/2019 Hemoglobin 14.1, normal  liver function tests, TSH 1.14 Total cholesterol 177, HDL 61, LDL 107, triglycerides 102 05/27/2020 Cholesterol 198, HDL 65, LDL 117, triglycerides 91 Hemoglobin 15.4, creatinine 0.86, potassium 4.1, normal liver function tests, TSH 1.21  ASSESSMENT:    1. Paroxysmal atrial fibrillation (HCC)   2. PVCs (premature ventricular contractions)      PLAN:  In order of problems listed above:  1. PAFib: Although this was reported in her chart, from documentation is not available and she has not had clinical atrial fibrillation in over 20 years.  Anticoagulation is not justified. 2. PVCs: These have correlated with her symptoms during event monitoring.  Currently well controlled.  She did not tolerate beta-blockers which caused worsening Hilario Quarry hallucinations related to macular degeneration;  retina specialist is Dr. Posey Pronto. 3. Thoracic kyphosis/osteoporosis: Recovering well from recent back surgery.  Has history of wedge compression fracture.  Suspect IV fluid administration was a cause for her generalized edema, returning to baseline.      Medication Adjustments/Labs and Tests Ordered: Current medicines are reviewed at length with the  patient today.  Concerns regarding medicines are outlined above.  Medication changes, Labs and Tests ordered today are listed in the Patient Instructions below. Patient Instructions  Medication Instructions:  No changes *If you need a refill on your cardiac medications before your next appointment, please call your pharmacy*   Lab Work: None ordered If you have labs (blood work) drawn today and your tests are completely normal, you will receive your results only by: [name] MyChart Message (if you have MyChart) OR . A paper copy in the mail If you have any lab test that is abnormal or we need to change your treatment, we will call you to review the results.   Testing/Procedures: None ordered   Follow-Up: At [facility], you and your health needs are our priority.  As part of our continuing mission to provide you with exceptional heart care, we have created designated Provider Care Teams.  These Care Teams include your primary Cardiologist (physician) and Advanced Practice Providers (APPs -  Physician Assistants and Nurse Practitioners) who all work together to provide you with the care you need, when you need it.  We recommend signing up for the patient portal called "MyChart".  Sign up information is provided on this After Visit Summary.  MyChart is used to connect with patients for Virtual Visits (Telemedicine).  Patients are able to view lab/test results, encounter notes, upcoming appointments, etc.  Non-urgent messages can be sent to your provider as well.   To learn more about what you can do with MyChart, go to [email or web address].    Your next appointment:   12 month(s)  The format for your next appointment:   In Person  Provider:   You may see Sanda Klein, MD or one of the following Advanced Practice Providers on your designated Care Team:    Almyra Deforest, PA-C  Fabian Sharp, [place] or   Roby Lofts, PA-C       Signed, Sanda Klein, MD  10/27/2020 10:32 AM     [facility] [address], [place], [place]  [address] Phone: [phone]; Fax: [phone]

## 2020-10-27 NOTE — Patient Instructions (Signed)

## 2020-10-29 DIAGNOSIS — Z961 Presence of intraocular lens: Secondary | ICD-10-CM | POA: Diagnosis not present

## 2020-10-29 DIAGNOSIS — H52203 Unspecified astigmatism, bilateral: Secondary | ICD-10-CM | POA: Diagnosis not present

## 2020-10-29 DIAGNOSIS — H353132 Nonexudative age-related macular degeneration, bilateral, intermediate dry stage: Secondary | ICD-10-CM | POA: Diagnosis not present

## 2020-11-01 DIAGNOSIS — Z1159 Encounter for screening for other viral diseases: Secondary | ICD-10-CM | POA: Diagnosis not present

## 2020-11-01 DIAGNOSIS — Z20828 Contact with and (suspected) exposure to other viral communicable diseases: Secondary | ICD-10-CM | POA: Diagnosis not present

## 2020-11-08 DIAGNOSIS — Z20828 Contact with and (suspected) exposure to other viral communicable diseases: Secondary | ICD-10-CM | POA: Diagnosis not present

## 2020-11-08 DIAGNOSIS — Z1159 Encounter for screening for other viral diseases: Secondary | ICD-10-CM | POA: Diagnosis not present

## 2020-11-15 DIAGNOSIS — I480 Paroxysmal atrial fibrillation: Secondary | ICD-10-CM | POA: Diagnosis not present

## 2020-11-15 DIAGNOSIS — D509 Iron deficiency anemia, unspecified: Secondary | ICD-10-CM | POA: Diagnosis not present

## 2020-11-15 DIAGNOSIS — K219 Gastro-esophageal reflux disease without esophagitis: Secondary | ICD-10-CM | POA: Diagnosis not present

## 2020-11-15 DIAGNOSIS — E7800 Pure hypercholesterolemia, unspecified: Secondary | ICD-10-CM | POA: Diagnosis not present

## 2020-11-15 DIAGNOSIS — E039 Hypothyroidism, unspecified: Secondary | ICD-10-CM | POA: Diagnosis not present

## 2020-11-15 DIAGNOSIS — M810 Age-related osteoporosis without current pathological fracture: Secondary | ICD-10-CM | POA: Diagnosis not present

## 2020-11-15 DIAGNOSIS — Z20828 Contact with and (suspected) exposure to other viral communicable diseases: Secondary | ICD-10-CM | POA: Diagnosis not present

## 2020-11-15 DIAGNOSIS — H269 Unspecified cataract: Secondary | ICD-10-CM | POA: Diagnosis not present

## 2020-11-15 DIAGNOSIS — Z1159 Encounter for screening for other viral diseases: Secondary | ICD-10-CM | POA: Diagnosis not present

## 2020-11-18 DIAGNOSIS — Z1389 Encounter for screening for other disorder: Secondary | ICD-10-CM | POA: Diagnosis not present

## 2020-11-18 DIAGNOSIS — Z0000 Encounter for general adult medical examination without abnormal findings: Secondary | ICD-10-CM | POA: Diagnosis not present

## 2020-11-22 DIAGNOSIS — Z1159 Encounter for screening for other viral diseases: Secondary | ICD-10-CM | POA: Diagnosis not present

## 2020-11-22 DIAGNOSIS — Z20828 Contact with and (suspected) exposure to other viral communicable diseases: Secondary | ICD-10-CM | POA: Diagnosis not present

## 2020-11-29 DIAGNOSIS — Z20828 Contact with and (suspected) exposure to other viral communicable diseases: Secondary | ICD-10-CM | POA: Diagnosis not present

## 2020-11-29 DIAGNOSIS — Z1159 Encounter for screening for other viral diseases: Secondary | ICD-10-CM | POA: Diagnosis not present

## 2020-11-30 DIAGNOSIS — E039 Hypothyroidism, unspecified: Secondary | ICD-10-CM | POA: Diagnosis not present

## 2020-11-30 DIAGNOSIS — Z131 Encounter for screening for diabetes mellitus: Secondary | ICD-10-CM | POA: Diagnosis not present

## 2020-11-30 DIAGNOSIS — E049 Nontoxic goiter, unspecified: Secondary | ICD-10-CM | POA: Diagnosis not present

## 2020-11-30 DIAGNOSIS — Z8679 Personal history of other diseases of the circulatory system: Secondary | ICD-10-CM | POA: Diagnosis not present

## 2020-11-30 DIAGNOSIS — I493 Ventricular premature depolarization: Secondary | ICD-10-CM | POA: Diagnosis not present

## 2020-11-30 DIAGNOSIS — Z0000 Encounter for general adult medical examination without abnormal findings: Secondary | ICD-10-CM | POA: Diagnosis not present

## 2020-11-30 DIAGNOSIS — E7800 Pure hypercholesterolemia, unspecified: Secondary | ICD-10-CM | POA: Diagnosis not present

## 2020-12-01 ENCOUNTER — Other Ambulatory Visit: Payer: Self-pay | Admitting: Cardiovascular Disease

## 2020-12-06 DIAGNOSIS — Z1159 Encounter for screening for other viral diseases: Secondary | ICD-10-CM | POA: Diagnosis not present

## 2020-12-06 DIAGNOSIS — Z20828 Contact with and (suspected) exposure to other viral communicable diseases: Secondary | ICD-10-CM | POA: Diagnosis not present

## 2020-12-13 DIAGNOSIS — Z1159 Encounter for screening for other viral diseases: Secondary | ICD-10-CM | POA: Diagnosis not present

## 2020-12-13 DIAGNOSIS — Z20828 Contact with and (suspected) exposure to other viral communicable diseases: Secondary | ICD-10-CM | POA: Diagnosis not present

## 2020-12-16 DIAGNOSIS — M810 Age-related osteoporosis without current pathological fracture: Secondary | ICD-10-CM | POA: Diagnosis not present

## 2020-12-16 DIAGNOSIS — H269 Unspecified cataract: Secondary | ICD-10-CM | POA: Diagnosis not present

## 2020-12-16 DIAGNOSIS — E7800 Pure hypercholesterolemia, unspecified: Secondary | ICD-10-CM | POA: Diagnosis not present

## 2020-12-16 DIAGNOSIS — D509 Iron deficiency anemia, unspecified: Secondary | ICD-10-CM | POA: Diagnosis not present

## 2020-12-16 DIAGNOSIS — E039 Hypothyroidism, unspecified: Secondary | ICD-10-CM | POA: Diagnosis not present

## 2020-12-16 DIAGNOSIS — I480 Paroxysmal atrial fibrillation: Secondary | ICD-10-CM | POA: Diagnosis not present

## 2020-12-16 DIAGNOSIS — K219 Gastro-esophageal reflux disease without esophagitis: Secondary | ICD-10-CM | POA: Diagnosis not present

## 2020-12-20 DIAGNOSIS — Z1159 Encounter for screening for other viral diseases: Secondary | ICD-10-CM | POA: Diagnosis not present

## 2020-12-20 DIAGNOSIS — Z20828 Contact with and (suspected) exposure to other viral communicable diseases: Secondary | ICD-10-CM | POA: Diagnosis not present

## 2020-12-24 ENCOUNTER — Other Ambulatory Visit: Payer: Self-pay | Admitting: Family Medicine

## 2020-12-24 DIAGNOSIS — E049 Nontoxic goiter, unspecified: Secondary | ICD-10-CM

## 2020-12-27 DIAGNOSIS — Z20828 Contact with and (suspected) exposure to other viral communicable diseases: Secondary | ICD-10-CM | POA: Diagnosis not present

## 2020-12-27 DIAGNOSIS — Z1159 Encounter for screening for other viral diseases: Secondary | ICD-10-CM | POA: Diagnosis not present

## 2021-01-03 DIAGNOSIS — Z1159 Encounter for screening for other viral diseases: Secondary | ICD-10-CM | POA: Diagnosis not present

## 2021-01-03 DIAGNOSIS — Z20828 Contact with and (suspected) exposure to other viral communicable diseases: Secondary | ICD-10-CM | POA: Diagnosis not present

## 2021-01-10 DIAGNOSIS — Z20828 Contact with and (suspected) exposure to other viral communicable diseases: Secondary | ICD-10-CM | POA: Diagnosis not present

## 2021-01-10 DIAGNOSIS — Z1159 Encounter for screening for other viral diseases: Secondary | ICD-10-CM | POA: Diagnosis not present

## 2021-01-14 ENCOUNTER — Other Ambulatory Visit: Payer: Self-pay

## 2021-01-14 ENCOUNTER — Ambulatory Visit
Admission: RE | Admit: 2021-01-14 | Discharge: 2021-01-14 | Disposition: A | Discharge status: Home / Self Care | Payer: Medicare HMO | Source: Ambulatory Visit | Attending: Family Medicine | Admitting: Family Medicine

## 2021-01-14 DIAGNOSIS — E049 Nontoxic goiter, unspecified: Secondary | ICD-10-CM

## 2021-01-14 DIAGNOSIS — E042 Nontoxic multinodular goiter: Secondary | ICD-10-CM | POA: Diagnosis not present

## 2021-01-17 DIAGNOSIS — Z20828 Contact with and (suspected) exposure to other viral communicable diseases: Secondary | ICD-10-CM | POA: Diagnosis not present

## 2021-01-17 DIAGNOSIS — Z1159 Encounter for screening for other viral diseases: Secondary | ICD-10-CM | POA: Diagnosis not present

## 2021-01-24 DIAGNOSIS — Z1159 Encounter for screening for other viral diseases: Secondary | ICD-10-CM | POA: Diagnosis not present

## 2021-01-24 DIAGNOSIS — Z20828 Contact with and (suspected) exposure to other viral communicable diseases: Secondary | ICD-10-CM | POA: Diagnosis not present

## 2021-01-31 DIAGNOSIS — Z8616 Personal history of COVID-19: Secondary | ICD-10-CM | POA: Diagnosis not present

## 2021-02-01 ENCOUNTER — Other Ambulatory Visit: Payer: Self-pay | Admitting: Family Medicine

## 2021-02-01 DIAGNOSIS — E041 Nontoxic single thyroid nodule: Secondary | ICD-10-CM

## 2021-02-14 DIAGNOSIS — R051 Acute cough: Secondary | ICD-10-CM | POA: Diagnosis not present

## 2021-02-14 DIAGNOSIS — Z8616 Personal history of COVID-19: Secondary | ICD-10-CM | POA: Diagnosis not present

## 2021-02-15 ENCOUNTER — Other Ambulatory Visit (HOSPITAL_COMMUNITY)
Admission: RE | Admit: 2021-02-15 | Discharge: 2021-02-15 | Disposition: A | Discharge status: Home / Self Care | Payer: Medicare HMO | Source: Ambulatory Visit | Attending: Radiology | Admitting: Radiology

## 2021-02-15 ENCOUNTER — Ambulatory Visit
Admission: RE | Admit: 2021-02-15 | Discharge: 2021-02-15 | Disposition: A | Discharge status: Home / Self Care | Payer: Medicare HMO | Source: Ambulatory Visit | Attending: Family Medicine | Admitting: Family Medicine

## 2021-02-15 ENCOUNTER — Other Ambulatory Visit: Payer: Self-pay

## 2021-02-15 DIAGNOSIS — E041 Nontoxic single thyroid nodule: Secondary | ICD-10-CM | POA: Diagnosis not present

## 2021-02-15 DIAGNOSIS — D440 Neoplasm of uncertain behavior of thyroid gland: Secondary | ICD-10-CM | POA: Diagnosis not present

## 2021-02-16 LAB — CYTOLOGY - NON PAP

## 2021-02-21 DIAGNOSIS — Z8616 Personal history of COVID-19: Secondary | ICD-10-CM | POA: Diagnosis not present

## 2021-02-28 DIAGNOSIS — Z8616 Personal history of COVID-19: Secondary | ICD-10-CM | POA: Diagnosis not present

## 2021-03-03 ENCOUNTER — Encounter (HOSPITAL_COMMUNITY): Payer: Self-pay

## 2021-03-07 DIAGNOSIS — Z20828 Contact with and (suspected) exposure to other viral communicable diseases: Secondary | ICD-10-CM | POA: Diagnosis not present

## 2021-03-14 DIAGNOSIS — Z8616 Personal history of COVID-19: Secondary | ICD-10-CM | POA: Diagnosis not present

## 2021-03-14 DIAGNOSIS — R899 Unspecified abnormal finding in specimens from other organs, systems and tissues: Secondary | ICD-10-CM | POA: Diagnosis not present

## 2021-03-14 DIAGNOSIS — E039 Hypothyroidism, unspecified: Secondary | ICD-10-CM | POA: Diagnosis not present

## 2021-03-21 DIAGNOSIS — Z8616 Personal history of COVID-19: Secondary | ICD-10-CM | POA: Diagnosis not present

## 2021-03-28 DIAGNOSIS — Z20828 Contact with and (suspected) exposure to other viral communicable diseases: Secondary | ICD-10-CM | POA: Diagnosis not present

## 2021-03-29 DIAGNOSIS — Z1231 Encounter for screening mammogram for malignant neoplasm of breast: Secondary | ICD-10-CM | POA: Diagnosis not present

## 2021-03-30 DIAGNOSIS — Z23 Encounter for immunization: Secondary | ICD-10-CM | POA: Diagnosis not present

## 2021-04-04 DIAGNOSIS — Z8616 Personal history of COVID-19: Secondary | ICD-10-CM | POA: Diagnosis not present

## 2021-04-08 DIAGNOSIS — R928 Other abnormal and inconclusive findings on diagnostic imaging of breast: Secondary | ICD-10-CM | POA: Diagnosis not present

## 2021-04-08 DIAGNOSIS — R922 Inconclusive mammogram: Secondary | ICD-10-CM | POA: Diagnosis not present

## 2021-04-11 DIAGNOSIS — Z20828 Contact with and (suspected) exposure to other viral communicable diseases: Secondary | ICD-10-CM | POA: Diagnosis not present

## 2021-04-14 ENCOUNTER — Ambulatory Visit: Payer: Self-pay | Admitting: Surgery

## 2021-04-14 DIAGNOSIS — E042 Nontoxic multinodular goiter: Secondary | ICD-10-CM | POA: Diagnosis not present

## 2021-04-14 DIAGNOSIS — D440 Neoplasm of uncertain behavior of thyroid gland: Secondary | ICD-10-CM | POA: Diagnosis not present

## 2021-04-14 DIAGNOSIS — E039 Hypothyroidism, unspecified: Secondary | ICD-10-CM | POA: Diagnosis not present

## 2021-04-18 DIAGNOSIS — Z20828 Contact with and (suspected) exposure to other viral communicable diseases: Secondary | ICD-10-CM | POA: Diagnosis not present

## 2021-04-19 NOTE — Patient Instructions (Signed)
**Note Jill-Identified via Obfuscation** DUE TO COVID-19 ONLY ONE VISITOR IS ALLOWED TO COME WITH YOU AND STAY IN THE WAITING ROOM ONLY DURING PRE OP AND PROCEDURE DAY OF SURGERY IF YOU ARE GOING HOME AFTER SURGERY. IF YOU ARE SPENDING THE NIGHT 2 PEOPLE MAY VISIT WITH YOU IN YOUR PRIVATE ROOM AFTER SURGERY UNTIL VISITING  HOURS ARE OVER AT 8:00 PM AND 1 VISITOR  CAN SPEND THE NIGHT.   YOU NEED TO HAVE A COVID 19 TEST ON__10/17___ THIS TEST MUST BE DONE BEFORE SURGERY,  COVID TESTING SITE  IS LOCATED AT [place], [place]. REMAIN IN YOUR CAR THIS IS A DRIVE UP TEST. AFTER YOUR COVID TEST PLEASE WEAR A MASK OUT IN PUBLIC AND SOCIAL DISTANCE AND Jill Mcdowell YOUR HANDS FREQUENTLY, ALSO ASK ALL YOUR CLOSE CONTACT PERSONS TO WEAR A MASK AND SOCIAL DISTANCE AND Jill Mcdowell THEIR HANDS FREQUENTLY ALSO.               Jill Mcdowell     Your procedure is scheduled on: 05/16/21   Report to [facility] Main  Entrance   Report to admitting at   7:45 AM     Call this number if you have problems the morning of surgery [phone]    Remember: Do not eat food  :After Midnight the night before your surgery,                  You may have clear liquids from midnight until 7:00 am    CLEAR LIQUID DIET   Foods Allowed                                                                     Foods Excluded                                                                                           liquids that you cannot  Plain Jell-O any favor except red or purple                                           see through such as: Fruit ices (not with fruit pulp)                                     milk, soups, orange juice  Iced Popsicles                                    All solid food Carbonated beverages, regular and diet  Cranberry, grape and apple juices Sports drinks like Gatorade Lightly seasoned clear broth or consume(fat free) Sugar    BRUSH YOUR TEETH MORNING OF SURGERY AND RINSE YOUR MOUTH OUT,  NO CHEWING GUM CANDY OR MINTS.     Take these medicines the morning of surgery with A SIP OF WATER: Dilt-XR, Levothyroxine, Omeprazole                                You may not have any metal on your body including hair pins and              piercings  Do not wear jewelry, make-up, lotions, powders or perfumes, deodorant             Do not wear nail polish on your fingernails.  Do not shave  48 hours prior to surgery.                Do not bring valuables to the hospital. [place].  Contacts, dentures or bridgework may not be worn into surgery.  Leave suitcase in the car. After surgery it may be brought to your room.              [facility] - Preparing for Surgery Before surgery, you can play an important role.  Because skin is not sterile, your skin needs to be as free of germs as possible.  You can reduce the number of germs on your skin by washing with CHG (chlorahexidine gluconate) soap before surgery.  CHG is an antiseptic cleaner which kills germs and bonds with the skin to continue killing germs even after washing. Please DO NOT use if you have an allergy to CHG or antibacterial soaps.  If your skin becomes reddened/irritated stop using the CHG and inform your nurse when you arrive at Short Stay. Do not shave (including legs and underarms) for at least 48 hours prior to the first CHG shower.   Please follow these instructions carefully:  1.  Shower with CHG Soap the night before surgery and the  morning of Surgery.  2.  If you choose to wash your hair, wash your hair first as usual with your  normal  shampoo.  3.  After you shampoo, rinse your hair and body thoroughly to remove the  shampoo.                            4.  Use CHG as you would any other liquid soap.  You can apply chg directly  to the skin and wash                       Gently with a scrungie or clean washcloth.  5.  Apply the CHG Soap to your body ONLY FROM THE  NECK DOWN.   Do not use on face/ open                           Wound or open sores. Avoid contact with eyes, ears mouth and genitals (private parts).                       Wash face,  Genitals (private parts) with your normal soap.  6.  Wash thoroughly, paying special attention to the area where your surgery  will be performed.  7.  Thoroughly rinse your body with warm water from the neck down.  8.  DO NOT shower/wash with your normal soap after using and rinsing off  the CHG Soap.                9.  Pat yourself dry with a clean towel.            10.  Wear clean pajamas.            11.  Place clean sheets on your bed the night of your first shower and do not  sleep with pets. Day of Surgery : Do not apply any lotions/deodorants the morning of surgery.  Please wear clean clothes to the hospital/surgery center.  FAILURE TO FOLLOW THESE INSTRUCTIONS MAY RESULT IN THE CANCELLATION OF YOUR SURGERY PATIENT SIGNATURE_________________________________  NURSE SIGNATURE__________________________________  ________________________________________________________________________

## 2021-04-25 ENCOUNTER — Encounter (HOSPITAL_COMMUNITY): Payer: Self-pay

## 2021-04-25 ENCOUNTER — Inpatient Hospital Stay (HOSPITAL_COMMUNITY)
Admission: RE | Admit: 2021-04-25 | Discharge: 2021-04-25 | Disposition: A | Discharge status: Home / Self Care | Payer: Medicare HMO | Source: Ambulatory Visit

## 2021-04-25 DIAGNOSIS — Z20828 Contact with and (suspected) exposure to other viral communicable diseases: Secondary | ICD-10-CM | POA: Diagnosis not present

## 2021-04-25 DIAGNOSIS — Z01818 Encounter for other preprocedural examination: Secondary | ICD-10-CM

## 2021-04-25 HISTORY — DX: Personal history of urinary calculi: Z87.442

## 2021-04-25 HISTORY — DX: Cardiac arrhythmia, unspecified: I49.9

## 2021-04-25 NOTE — Patient Instructions (Addendum)
DUE TO COVID-19 ONLY ONE VISITOR IS ALLOWED TO COME WITH YOU AND STAY IN THE WAITING ROOM ONLY DURING PRE OP AND PROCEDURE.   **NO VISITORS ARE ALLOWED IN THE SHORT STAY AREA OR RECOVERY ROOM!!**  IF YOU WILL BE ADMITTED INTO THE HOSPITAL YOU ARE ALLOWED ONLY TWO SUPPORT PEOPLE DURING VISITATION HOURS ONLY (7 AM -8PM)    Up to two visitors ages 78+ are allowed at one time in a patient's room.  The visitors may rotate out with other people throughout the day.  Additionally, up to two children between the ages of 63 and 59 are allowed and do not count toward the number of allowed visitors.  Children within this age range must be accompanied by an adult visitor.  One adult visitor may remain with the patient overnight and must be in the room by 8 PM.  COVID SWAB TESTING MUST BE COMPLETED ON:  Thursday, 05-12-21, Between the hours of 8 and 3  **MUST PRESENT COMPLETED FORM AT TESTING SITE**    [place] (backside of the building)  You are not required to quarantine, however you are required to wear a well-fitted mask when you are out and around people not in your household.  Hand Hygiene often Do NOT share personal items Notify your provider if you are in close contact with someone who has COVID or you develop fever 100.4 or greater, new onset of sneezing, cough, sore throat, shortness of breath or body aches.        Your procedure is scheduled on:  Monday, 05-16-21   Report to [facility] Main  Entrance     Report to admitting at 7:45 AM   Call this number if you have problems the morning of surgery [phone]   Do not eat food :After Midnight.   May have liquids until 7:00 AM day of surgery  CLEAR LIQUID DIET  Foods Allowed                                                                     Foods Excluded  Water, Black Coffee (no milk/no creamer) and tea, regular and decaf                              liquids that you cannot  Plain Jell-O in any  flavor  (No red)                         see through such as: Fruit ices (not with fruit pulp)                                 milk, soups, orange juice  Iced Popsicles (No red)                                    All solid food                             Apple juices  Sports drinks like Gatorade (No red) Lightly seasoned clear broth or consume(fat free) Sugar    Oral Hygiene is also important to reduce your risk of infection.                                    Remember - BRUSH YOUR TEETH THE MORNING OF SURGERY WITH YOUR REGULAR TOOTHPASTE   Do NOT smoke after Midnight  Take these medicines the morning of surgery with A SIP OF WATER:  Acetaminophen, Dilt-XR, Levothyroxine, Loratadine, Omeprazole   Stop all vitamins and herbal supplements a week before surgery   You may not have any metal on your body including hair pins, jewelry, and body piercing             Do not wear make-up, lotions, powders, perfumes or deodorant  Do not wear nail polish including gel and S&S, artificial/acrylic nails, or any other type of covering on natural nails including finger and toenails. If you have artificial nails, gel coating, etc. that needs to be removed by a nail salon please have this removed prior to surgery or surgery may need to be canceled/ delayed if the surgeon/ anesthesia feels like they are unable to be safely monitored.   Do not shave  48 hours prior to surgery.     Do not bring valuables to the hospital. [place].   Contacts, dentures or bridgework may not be worn into surgery.   Bring small overnight bag day of surgery.   Please read over the following fact sheets you were given: IF YOU HAVE QUESTIONS ABOUT YOUR PRE OP INSTRUCTIONS PLEASE CALL [place] - Preparing for Surgery Before surgery, you can play an important role.  Because skin is not sterile, your skin needs to be as free of germs as possible.  You can reduce the  number of germs on your skin by washing with CHG (chlorahexidine gluconate) soap before surgery.  CHG is an antiseptic cleaner which kills germs and bonds with the skin to continue killing germs even after washing. Please DO NOT use if you have an allergy to CHG or antibacterial soaps.  If your skin becomes reddened/irritated stop using the CHG and inform your nurse when you arrive at Short Stay. Do not shave (including legs and underarms) for at least 48 hours prior to the first CHG shower.  You may shave your face/neck.  Please follow these instructions carefully:  1.  Shower with CHG Soap the night before surgery and the  morning of surgery.  2.  If you choose to wash your hair, wash your hair first as usual with your normal  shampoo.  3.  After you shampoo, rinse your hair and body thoroughly to remove the shampoo.                             4.  Use CHG as you would any other liquid soap.  You can apply chg directly to the skin and wash.  Gently with a scrungie or clean washcloth.  5.  Apply the CHG Soap to your body ONLY FROM THE NECK DOWN.   Do   not use on face/ open  Wound or open sores. Avoid contact with eyes, ears mouth and   genitals (private parts).                       Wash face,  Genitals (private parts) with your normal soap.             6.  Wash thoroughly, paying special attention to the area where your    surgery  will be performed.  7.  Thoroughly rinse your body with warm water from the neck down.  8.  DO NOT shower/wash with your normal soap after using and rinsing off the CHG Soap.                9.  Pat yourself dry with a clean towel.            10.  Wear clean pajamas.            11.  Place clean sheets on your bed the night of your first shower and do not  sleep with pets. Day of Surgery : Do not apply any lotions/deodorants the morning of surgery.  Please wear clean clothes to the hospital/surgery center.  FAILURE TO FOLLOW THESE  INSTRUCTIONS MAY RESULT IN THE CANCELLATION OF YOUR SURGERY  PATIENT SIGNATURE_________________________________  NURSE SIGNATURE__________________________________  ________________________________________________________________________

## 2021-04-25 NOTE — Progress Notes (Signed)
Missed preop per Granddaughter pt. Not feeling well

## 2021-04-25 NOTE — Progress Notes (Signed)
PCP -  Cardiologist -   PPM/ICD -  Device Orders -  Rep Notified -   Chest x-ray -  EKG -  Stress Test -  ECHO -  Cardiac Cath -   Sleep Study -  CPAP -   Fasting Blood Sugar -  Checks Blood Sugar _____ times a day  Blood Thinner Instructions: Aspirin Instructions:  ERAS Protcol - PRE-SURGERY Ensure or G2-   COVID TEST-  COVID vaccine -  Activity-- Anesthesia review:   Patient denies shortness of breath, fever, cough and chest pain at PAT appointment   All instructions explained to the patient, with a verbal understanding of the material. Patient agrees to go over the instructions while at home for a better understanding. Patient also instructed to self quarantine after being tested for COVID-19. The opportunity to ask questions was provided.

## 2021-04-25 NOTE — Progress Notes (Addendum)
COVID swab appointment:  05-12-21  COVID Vaccine Completed:Yes x2  07-17-19 08-14-19 Date COVID Vaccine completed: Has received booster:  Yes x2 04-29-20 11-04-20 COVID vaccine manufacturer:    Moderna     Date of COVID positive in last 90 days:  No  PCP - Milagros Evener, MD Cardiologist - Sanda Klein, MD  Chest x-ray - 04-28-21 Epic EKG - 10-27-20 Epic Stress Test - greater than 2 years, Epic ECHO - N/A Cardiac Cath - N/A Pacemaker/ICD device last checked: Spinal Cord Stimulator:  Sleep Study - N/A CPAP -   Fasting Blood Sugar - N/A Checks Blood Sugar _____ times a day  Blood Thinner Instructions: N/A Aspirin Instructions: Last Dose:  Activity level:  Can go up a flight of stairs and perform activities of daily living without stopping and without symptoms of chest pain or shortness of breath.  Able to exercise without symptoms   Anesthesia review: Afib, PVCs  Patient denies shortness of breath, fever, cough and chest pain at PAT appointment   Patient verbalized understanding of instructions that were given to them at the PAT appointment. Patient was also instructed that they will need to review over the PAT instructions again at home before surgery.

## 2021-04-28 ENCOUNTER — Ambulatory Visit (HOSPITAL_COMMUNITY)
Admission: RE | Admit: 2021-04-28 | Discharge: 2021-04-28 | Disposition: A | Discharge status: Home / Self Care | Payer: Medicare HMO | Source: Ambulatory Visit | Attending: Anesthesiology | Admitting: Anesthesiology

## 2021-04-28 ENCOUNTER — Encounter (HOSPITAL_COMMUNITY)
Admission: RE | Admit: 2021-04-28 | Discharge: 2021-04-28 | Disposition: A | Discharge status: Home / Self Care | Payer: Medicare HMO | Source: Ambulatory Visit | Attending: Surgery | Admitting: Surgery

## 2021-04-28 ENCOUNTER — Encounter (HOSPITAL_COMMUNITY): Payer: Self-pay

## 2021-04-28 ENCOUNTER — Other Ambulatory Visit: Payer: Self-pay

## 2021-04-28 DIAGNOSIS — Z01818 Encounter for other preprocedural examination: Secondary | ICD-10-CM | POA: Diagnosis not present

## 2021-04-28 DIAGNOSIS — Z01811 Encounter for preprocedural respiratory examination: Secondary | ICD-10-CM | POA: Diagnosis not present

## 2021-04-28 HISTORY — DX: Unspecified osteoarthritis, unspecified site: M19.90

## 2021-04-28 HISTORY — DX: Malignant neoplasm of cervix uteri, unspecified: C53.9

## 2021-04-28 HISTORY — DX: Personal history of other diseases of the digestive system: Z87.19

## 2021-04-28 HISTORY — DX: Personal history of other diseases of the nervous system and sense organs: Z86.69

## 2021-04-28 HISTORY — DX: Anemia, unspecified: D64.9

## 2021-04-28 HISTORY — DX: Unspecified macular degeneration: H35.30

## 2021-04-28 LAB — CBC
HCT: 45 % (ref 36.0–46.0)
Hemoglobin: 14.7 g/dL (ref 12.0–15.0)
MCH: 30.7 pg (ref 26.0–34.0)
MCHC: 32.7 g/dL (ref 30.0–36.0)
MCV: 93.9 fL (ref 80.0–100.0)
Platelets: 181 10*3/uL (ref 150–400)
RBC: 4.79 MIL/uL (ref 3.87–5.11)
RDW: 13 % (ref 11.5–15.5)
WBC: 6.4 10*3/uL (ref 4.0–10.5)
nRBC: 0 % (ref 0.0–0.2)

## 2021-04-28 LAB — BASIC METABOLIC PANEL
Anion gap: 7 (ref 5–15)
BUN: 15 mg/dL (ref 8–23)
CO2: 29 mmol/L (ref 22–32)
Calcium: 9.3 mg/dL (ref 8.9–10.3)
Chloride: 105 mmol/L (ref 98–111)
Creatinine, Ser: 0.78 mg/dL (ref 0.44–1.00)
GFR, Estimated: >60 mL/min (ref >60)
Glucose, Bld: 120 mg/dL — ABNORMAL HIGH (ref 70–99)
Potassium: 3.9 mmol/L (ref 3.5–5.1)
Sodium: 141 mmol/L (ref 135–145)

## 2021-05-02 DIAGNOSIS — Z20822 Contact with and (suspected) exposure to covid-19: Secondary | ICD-10-CM | POA: Diagnosis not present

## 2021-05-05 NOTE — Progress Notes (Signed)
Anesthesia Chart Review   Case: [id number] Date/Time: 05/16/21 0945   Procedure: TOTAL THYROIDECTOMY   Anesthesia type: General   Pre-op diagnosis: thyroid neoplasm   Location: [facility] ROOM 01 / [facility]   Surgeons: Armandina Gemma, MD       DISCUSSION:87 y.o. former smoker with h/o GERD, remote h/o a-fib, PVCs, thyroid neoplasm scheduled for above procedure 05/16/2021 with Dr. Armandina Gemma.   Seen by cardiology 10/27/2020, stable at this visit.   Anticipate pt can proceed with planned procedure barring acute status change.   VS: BP 124/72   Pulse 75   Temp 36.8 C (Oral)   Resp 18   Ht 5' (1.524 m)   Wt 50.2 kg   SpO2 96%   BMI 21.60 kg/m   PROVIDERS: Rankins, Bill Salinas, MD is PCP   Croitoru, Dani Gobble, MD is Cardiologist  LABS: Labs reviewed: Acceptable for surgery. (all labs ordered are listed, but only abnormal results are displayed)  Labs Reviewed  BASIC METABOLIC PANEL - Abnormal; Notable for the following components:      Result Value   Glucose, Bld 120 (*)    All other components within normal limits  CBC     IMAGES:   EKG: 10/27/2020 Rate 86 bpm  NSR Septal infarct, age undetermined   CV:  Past Medical History:  Diagnosis Date   Anemia    Arrhythmia 02/06/2012   hx of paroxysmal a fib with no recurrence,rare palpations   Arthritis    Breast lump    Cervical cancer (HCC)    Dysrhythmia    GERD (gastroesophageal reflux disease)    History of hiatal hernia    History of kidney stones    Hx of migraine headaches    Macular degeneration    L eye   Thyroid disease     Past Surgical History:  Procedure Laterality Date   ABDOMINAL HYSTERECTOMY     CHOLECYSTECTOMY     KYPHOPLASTY N/A 12/18/2018   Procedure: KYPHOPLASTY, THORACIC SEVEN;  Surgeon: Newman Pies, MD;  Location: [facility];  Service: Neurosurgery;  Laterality: N/A;  KYPHOPLASTY, THORACIC 7   NM MYOCAR PERF WALL MOTION  02/19/2008   negative for ischemia   SUPRAVENTRICULAR TACHYCARDIA ABLATION       MEDICATIONS:  acetaminophen (TYLENOL) 500 MG tablet   Brimonidine Tartrate (LUMIFY) 0.025 % SOLN   calcium carbonate (TUMS) 500 MG chewable tablet   Cholecalciferol (VITAMIN D-3 PO)   DILT-XR 180 MG 24 hr capsule   iron polysaccharides (NIFEREX) 150 MG capsule   levothyroxine (SYNTHROID, LEVOTHROID) 50 MCG tablet   loratadine (CLARITIN) 10 MG tablet   Multiple Vitamins-Minerals (PRESERVISION AREDS PO)   omeprazole (PRILOSEC) 20 MG capsule   polyethylene glycol powder (GLYCOLAX/MIRALAX) 17 GM/SCOOP powder   No current facility-administered medications for this encounter.    Konrad Felix Ward, PA-C [facility] [phone]

## 2021-05-09 DIAGNOSIS — Z20822 Contact with and (suspected) exposure to covid-19: Secondary | ICD-10-CM | POA: Diagnosis not present

## 2021-05-12 ENCOUNTER — Other Ambulatory Visit: Payer: Self-pay | Admitting: Surgery

## 2021-05-12 LAB — SARS CORONAVIRUS 2 (TAT 6-24 HRS): SARS Coronavirus 2: NEGATIVE

## 2021-05-15 ENCOUNTER — Encounter (HOSPITAL_COMMUNITY): Payer: Self-pay | Admitting: Surgery

## 2021-05-15 DIAGNOSIS — E039 Hypothyroidism, unspecified: Secondary | ICD-10-CM | POA: Diagnosis present

## 2021-05-15 DIAGNOSIS — E042 Nontoxic multinodular goiter: Secondary | ICD-10-CM | POA: Diagnosis present

## 2021-05-15 DIAGNOSIS — D440 Neoplasm of uncertain behavior of thyroid gland: Secondary | ICD-10-CM | POA: Diagnosis present

## 2021-05-15 NOTE — H&P (Signed)
REFERRING PHYSICIAN: Madelin Rear, MD  PROVIDER: Opha Mcghee Charlotta Newton, MD  Chief Complaint: New Consultation (Thyroid neoplasm of uncertain behavior)   History of Present Illness:  Jill Mcdowell is referred by Dr. Madelin Rear for surgical evaluation and management of a newly diagnosed thyroid neoplasm of uncertain behavior. Jill Mcdowell's primary care physician is Dr. Milagros Evener. Jill Mcdowell has a longstanding history of hypothyroidism. She has been on levothyroxine for approximately 15 years. On recent physical exam the Jill Mcdowell was noted to have a right neck mass. She underwent an ultrasound demonstrating mild enlargement of the right thyroid lobe measuring 5.0 x 1.6 x 2.8 cm. Left thyroid lobe was normal in size. However there were bilateral thyroid nodules. The dominant nodule on the right measured 1.7 cm and was felt to be moderately suspicious. Jill Mcdowell underwent fine-needle aspiration biopsy on February 15, 2021. This demonstrated atypia of undetermined significance, Bethesda category III. The specimen was subsequently submitted for molecular genetic analysis, AFIRMA, and returned with a result of suspicious, rendering a risk of malignancy of 50%. Jill Mcdowell is therefore referred for consideration for resection for definitive diagnosis and management. Jill Mcdowell has no prior history of head or neck surgery. There is no family history of thyroid malignancy. Jill Mcdowell is accompanied today by her granddaughter.  Review of Systems: A complete review of systems was obtained from the Jill Mcdowell. I have reviewed this information and discussed as appropriate with the Jill Mcdowell. See HPI as well for other ROS.  Review of Systems  Constitutional: Negative.  HENT: Negative.  Eyes: Negative.  Respiratory: Negative.  Cardiovascular: Negative.  Gastrointestinal: Negative.  Genitourinary: Negative.  Musculoskeletal: Positive for back pain.  Skin: Negative.  Neurological: Negative.  Endo/Heme/Allergies:  Negative.  Psychiatric/Behavioral: Negative.    Medical History: Past Medical History:  Diagnosis Date   GERD (gastroesophageal reflux disease)   Thyroid disease   Jill Mcdowell Active Problem List  Diagnosis   Neoplasm of uncertain behavior of thyroid gland   Multiple thyroid nodules   Acquired hypothyroidism, unspecified   Past Surgical History:  Procedure Laterality Date   CHOLECYSTECTOMY   HYSTERECTOMY    Allergies  Allergen Reactions   Metoprolol Other (See Comments)  hallucinations hallucinations   Current Outpatient Medications on File Prior to Visit  Medication Sig Dispense Refill   diltiazem (DILT-XR) 180 MG XR capsule Take 1 capsule by mouth once daily   acetaminophen (TYLENOL) 500 MG tablet Take by mouth   calcium carbonate (TUMS) 200 mg calcium (500 mg) chewable tablet Take by mouth   IRON POLYSACCHARIDES 150 mg iron capsule   levothyroxine (SYNTHROID) 50 MCG tablet Take by mouth   omeprazole (PRILOSEC) 20 MG DR capsule Take by mouth   No current facility-administered medications on file prior to visit.   History reviewed. No pertinent family history.   Social History   Tobacco Use  Smoking Status Former Smoker   Quit date: 1989   Years since quitting: 33.8  Smokeless Tobacco Never Used    Social History   Socioeconomic History   Marital status: Married  Tobacco Use   Smoking status: Former Smoker  Quit date: 1989  Years since quitting: 33.8   Smokeless tobacco: Never Used  Scientific laboratory technician Use: Never used  Substance and Sexual Activity   Alcohol use: Not Currently   Drug use: Defer   Sexual activity: Defer   Objective:   Vitals:  BP: 120/64  Pulse: 87  Temp: 37 C (98.6 F)  SpO2: (!) 75%  Weight: 53.1 kg (  117 lb)  Height: 152.4 cm (5')   Body mass index is 22.85 kg/m.  Physical Exam   GENERAL APPEARANCE Development: normal Nutritional status: normal Gross deformities: none  SKIN Rash, lesions, ulcers: none Induration,  erythema: none Nodules: none palpable  EYES Conjunctiva and lids: normal Pupils: equal and reactive Iris: normal bilaterally  EARS, NOSE, MOUTH, THROAT External ears: no lesion or deformity External nose: no lesion or deformity Hearing: grossly normal Due to Covid-19 pandemic, Jill Mcdowell is wearing a mask.  NECK Symmetric: yes Trachea: midline Thyroid: In the mid right thyroid lobe is a smooth relatively firm nodule which is mildly tender to palpation. This is mobile with swallowing. Left thyroid lobe is without significant nodularity. There is no associated lymphadenopathy.  CHEST Respiratory effort: normal Retraction or accessory muscle use: no Breath sounds: normal bilaterally Rales, rhonchi, wheeze: none  CARDIOVASCULAR Auscultation: regular rhythm, normal rate Murmurs: none Pulses: radial pulse 2+ palpable Lower extremity edema: none  MUSCULOSKELETAL Station and gait: normal Digits and nails: no clubbing or cyanosis Muscle strength: grossly normal all extremities Range of motion: grossly normal all extremities Deformity: none  LYMPHATIC Cervical: none palpable Supraclavicular: none palpable  PSYCHIATRIC Oriented to person, place, and time: yes Mood and affect: normal for situation Judgment and insight: appropriate for situation  Assessment and Plan:   Multiple thyroid nodules  Neoplasm of uncertain behavior of thyroid gland  Acquired hypothyroidism, unspecified   Jill Mcdowell is referred by Dr. Madelin Rear for surgical evaluation of thyroid neoplasm of uncertain behavior and recommendations for management.  Jill Mcdowell provided with a copy of "The Thyroid Book: Medical and Surgical Treatment of Thyroid Problems", published by [employer], 16 pages. Book reviewed and explained to Jill Mcdowell during visit today.  Jill Mcdowell has a 1.7 cm neoplasm in the right thyroid lobe which is concerning on ultrasound evaluation and on physical examination. Molecular genetic testing  yields a 50% risk of malignancy. Given that the Jill Mcdowell has bilateral thyroid nodules, I have recommended proceeding with total thyroidectomy for definitive diagnosis and management. Today we discussed the risk and benefits of the surgery. We discussed the location of the surgical incision. We discussed the hospital stay to be anticipated. We discussed potential complications including recurrent laryngeal nerve injury and injury to parathyroid glands. We discussed her postoperative recovery and return to normal activities. We discussed the need for lifelong thyroid hormone replacement. The Jill Mcdowell understands and wishes to proceed with surgery in the near future.  The risks and benefits of the procedure have been discussed at length with the Jill Mcdowell. The Jill Mcdowell understands the proposed procedure, potential alternative treatments, and the course of recovery to be expected. All of the Jill Mcdowell's questions have been answered at this time. The Jill Mcdowell wishes to proceed with surgery.  Armandina Gemma, MD [facility] [place] practice Office: [phone]

## 2021-05-15 NOTE — Anesthesia Preprocedure Evaluation (Addendum)
Anesthesia Evaluation  Patient identified by MRN, date of birth, ID band Patient awake    Reviewed: Allergy & Precautions, NPO status , Patient's Chart, lab work & pertinent test results  History of Anesthesia Complications Negative for: history of anesthetic complications  Airway Mallampati: I  TM Distance: >3 FB Neck ROM: Full    Dental no notable dental hx.    Pulmonary former smoker,    Pulmonary exam normal        Cardiovascular Normal cardiovascular exam+ dysrhythmias Atrial Fibrillation      Neuro/Psych negative neurological ROS  negative psych ROS   GI/Hepatic Neg liver ROS, hiatal hernia, GERD  Medicated and Controlled,  Endo/Other  Hypothyroidism   Renal/GU negative Renal ROS  negative genitourinary   Musculoskeletal  (+) Arthritis ,   Abdominal   Peds  Hematology negative hematology ROS (+)   Anesthesia Other Findings Day of surgery medications reviewed with patient.  Reproductive/Obstetrics negative OB ROS                            Anesthesia Physical Anesthesia Plan  ASA: 2  Anesthesia Plan: General   Post-op Pain Management: Tylenol PO (pre-op)   Induction: Intravenous  PONV Risk Score and Plan: 3 and Treatment may vary due to age or medical condition, Ondansetron and Dexamethasone  Airway Management Planned: Oral ETT  Additional Equipment: None  Intra-op Plan:   Post-operative Plan: Extubation in OR  Informed Consent: I have reviewed the patients History and Physical, chart, labs and discussed the procedure including the risks, benefits and alternatives for the proposed anesthesia with the patient or authorized representative who has indicated his/her understanding and acceptance.     Dental advisory given  Plan Discussed with: CRNA  Anesthesia Plan Comments:        Anesthesia Quick Evaluation

## 2021-05-16 ENCOUNTER — Ambulatory Visit (HOSPITAL_COMMUNITY): Payer: Medicare HMO | Admitting: Anesthesiology

## 2021-05-16 ENCOUNTER — Other Ambulatory Visit: Payer: Self-pay

## 2021-05-16 ENCOUNTER — Encounter (HOSPITAL_COMMUNITY)
Admission: RE | Disposition: A | Discharge status: Home / Self Care | Payer: Self-pay | Source: Ambulatory Visit | Attending: Surgery

## 2021-05-16 ENCOUNTER — Encounter (HOSPITAL_COMMUNITY): Payer: Self-pay | Admitting: Surgery

## 2021-05-16 ENCOUNTER — Ambulatory Visit (HOSPITAL_COMMUNITY)
Admission: RE | Admit: 2021-05-16 | Discharge: 2021-05-17 | Disposition: A | Discharge status: Home / Self Care | Payer: Medicare HMO | Source: Ambulatory Visit | Attending: Surgery | Admitting: Surgery

## 2021-05-16 ENCOUNTER — Ambulatory Visit (HOSPITAL_COMMUNITY): Payer: Medicare HMO | Admitting: Physician Assistant

## 2021-05-16 DIAGNOSIS — C73 Malignant neoplasm of thyroid gland: Secondary | ICD-10-CM | POA: Insufficient documentation

## 2021-05-16 DIAGNOSIS — E041 Nontoxic single thyroid nodule: Secondary | ICD-10-CM | POA: Diagnosis not present

## 2021-05-16 DIAGNOSIS — E039 Hypothyroidism, unspecified: Secondary | ICD-10-CM | POA: Diagnosis not present

## 2021-05-16 DIAGNOSIS — Z01818 Encounter for other preprocedural examination: Secondary | ICD-10-CM

## 2021-05-16 DIAGNOSIS — R3915 Urgency of urination: Secondary | ICD-10-CM | POA: Diagnosis not present

## 2021-05-16 DIAGNOSIS — Z87891 Personal history of nicotine dependence: Secondary | ICD-10-CM | POA: Diagnosis not present

## 2021-05-16 DIAGNOSIS — Z87442 Personal history of urinary calculi: Secondary | ICD-10-CM | POA: Diagnosis not present

## 2021-05-16 DIAGNOSIS — R350 Frequency of micturition: Secondary | ICD-10-CM | POA: Insufficient documentation

## 2021-05-16 DIAGNOSIS — E042 Nontoxic multinodular goiter: Secondary | ICD-10-CM | POA: Diagnosis not present

## 2021-05-16 DIAGNOSIS — I4891 Unspecified atrial fibrillation: Secondary | ICD-10-CM | POA: Diagnosis not present

## 2021-05-16 DIAGNOSIS — D630 Anemia in neoplastic disease: Secondary | ICD-10-CM | POA: Diagnosis not present

## 2021-05-16 DIAGNOSIS — D440 Neoplasm of uncertain behavior of thyroid gland: Secondary | ICD-10-CM | POA: Diagnosis present

## 2021-05-16 HISTORY — PX: THYROIDECTOMY: SHX17

## 2021-05-16 LAB — URINALYSIS, ROUTINE W REFLEX MICROSCOPIC
Bilirubin Urine: NEGATIVE
Glucose, UA: NEGATIVE mg/dL
Hgb urine dipstick: NEGATIVE
Ketones, ur: 20 mg/dL — AB
Leukocytes,Ua: NEGATIVE
Nitrite: POSITIVE — AB
Protein, ur: NEGATIVE mg/dL
Specific Gravity, Urine: 1.012 (ref 1.005–1.030)
pH: 7 (ref 5.0–8.0)

## 2021-05-16 SURGERY — THYROIDECTOMY
Anesthesia: General | Site: Neck

## 2021-05-16 MED ORDER — 0.9 % SODIUM CHLORIDE (POUR BTL) OPTIME
TOPICAL | Status: DC | PRN
  Administered 2021-05-16: 1000 mL

## 2021-05-16 MED ORDER — ROCURONIUM BROMIDE 100 MG/10ML IV SOLN
INTRAVENOUS | Status: DC | PRN
  Administered 2021-05-16: 40 mg via INTRAVENOUS
  Administered 2021-05-16: 20 mg via INTRAVENOUS

## 2021-05-16 MED ORDER — ONDANSETRON HCL 4 MG/2ML IJ SOLN
INTRAMUSCULAR | Status: DC | PRN
  Administered 2021-05-16: 4 mg via INTRAVENOUS

## 2021-05-16 MED ORDER — LIDOCAINE HCL (CARDIAC) PF 100 MG/5ML IV SOSY
PREFILLED_SYRINGE | INTRAVENOUS | Status: DC | PRN
  Administered 2021-05-16: 50 mg via INTRAVENOUS

## 2021-05-16 MED ORDER — ONDANSETRON 4 MG PO TBDP: 4.0000 mg | ORAL_TABLET | Freq: Four times a day (QID) | ORAL | Status: DC | PRN

## 2021-05-16 MED ORDER — CEFAZOLIN SODIUM-DEXTROSE 2-4 GM/100ML-% IV SOLN
2.0000 g | INTRAVENOUS | Status: AC
  Administered 2021-05-16: 2 g via INTRAVENOUS
  Filled 2021-05-16: qty 100

## 2021-05-16 MED ORDER — LEVOTHYROXINE SODIUM 50 MCG PO TABS
50.0000 ug | ORAL_TABLET | Freq: Every day | ORAL | Status: DC
  Administered 2021-05-17: 50 ug via ORAL
  Filled 2021-05-16: qty 1

## 2021-05-16 MED ORDER — ROCURONIUM BROMIDE 10 MG/ML (PF) SYRINGE
PREFILLED_SYRINGE | INTRAVENOUS | Status: AC
  Filled 2021-05-16: qty 10

## 2021-05-16 MED ORDER — OXYCODONE HCL 5 MG/5ML PO SOLN
5.0000 mg | Freq: Once | ORAL | Status: AC | PRN
Start: 2021-05-16 — End: 2021-05-16

## 2021-05-16 MED ORDER — BRIMONIDINE TARTRATE 0.025 % OP SOLN
1.0000 [drp] | Freq: Every day | OPHTHALMIC | Status: DC
Start: 2021-05-17 — End: 2021-05-17

## 2021-05-16 MED ORDER — ORAL CARE MOUTH RINSE: 15.0000 mL | Freq: Once | OROMUCOSAL | Status: AC

## 2021-05-16 MED ORDER — FENTANYL CITRATE (PF) 100 MCG/2ML IJ SOLN
INTRAMUSCULAR | Status: AC
  Filled 2021-05-16: qty 2

## 2021-05-16 MED ORDER — HEMOSTATIC AGENTS (NO CHARGE) OPTIME
TOPICAL | Status: DC | PRN
  Administered 2021-05-16: 1 via TOPICAL

## 2021-05-16 MED ORDER — OXYCODONE HCL 5 MG PO TABS
5.0000 mg | ORAL_TABLET | Freq: Once | ORAL | Status: AC | PRN
  Administered 2021-05-16: 5 mg via ORAL

## 2021-05-16 MED ORDER — FENTANYL CITRATE PF 50 MCG/ML IJ SOSY
25.0000 ug | PREFILLED_SYRINGE | INTRAMUSCULAR | Status: DC | PRN
  Administered 2021-05-16 (×2): 50 ug via INTRAVENOUS

## 2021-05-16 MED ORDER — LACTATED RINGERS IV SOLN: INTRAVENOUS | Status: DC

## 2021-05-16 MED ORDER — CHLORHEXIDINE GLUCONATE CLOTH 2 % EX PADS: 6.0000 | MEDICATED_PAD | Freq: Once | CUTANEOUS | Status: DC

## 2021-05-16 MED ORDER — ACETAMINOPHEN 325 MG PO TABS: 650.0000 mg | ORAL_TABLET | Freq: Four times a day (QID) | ORAL | Status: DC | PRN

## 2021-05-16 MED ORDER — FENTANYL CITRATE PF 50 MCG/ML IJ SOSY
PREFILLED_SYRINGE | INTRAMUSCULAR | Status: AC
  Filled 2021-05-16: qty 2

## 2021-05-16 MED ORDER — OXYCODONE HCL 5 MG PO TABS
ORAL_TABLET | ORAL | Status: AC
  Filled 2021-05-16: qty 1

## 2021-05-16 MED ORDER — PANTOPRAZOLE SODIUM 40 MG PO TBEC
40.0000 mg | DELAYED_RELEASE_TABLET | Freq: Every day | ORAL | Status: DC
  Administered 2021-05-17: 40 mg via ORAL
  Filled 2021-05-16: qty 1

## 2021-05-16 MED ORDER — SODIUM CHLORIDE 0.45 % IV SOLN: INTRAVENOUS | Status: DC

## 2021-05-16 MED ORDER — TRAMADOL HCL 50 MG PO TABS
50.0000 mg | ORAL_TABLET | Freq: Four times a day (QID) | ORAL | Status: DC | PRN
  Administered 2021-05-16 – 2021-05-17 (×2): 50 mg via ORAL
  Filled 2021-05-16 (×2): qty 1

## 2021-05-16 MED ORDER — SUGAMMADEX SODIUM 200 MG/2ML IV SOLN
INTRAVENOUS | Status: DC | PRN
  Administered 2021-05-16: 140 mg via INTRAVENOUS

## 2021-05-16 MED ORDER — ACETAMINOPHEN 500 MG PO TABS
1000.0000 mg | ORAL_TABLET | Freq: Once | ORAL | Status: AC
  Administered 2021-05-16: 1000 mg via ORAL
  Filled 2021-05-16: qty 2

## 2021-05-16 MED ORDER — ONDANSETRON HCL 4 MG/2ML IJ SOLN
INTRAMUSCULAR | Status: AC
  Filled 2021-05-16: qty 2

## 2021-05-16 MED ORDER — HYDROMORPHONE HCL 1 MG/ML IJ SOLN: 1.0000 mg | INTRAMUSCULAR | Status: DC | PRN

## 2021-05-16 MED ORDER — OXYCODONE HCL 5 MG PO TABS: 5.0000 mg | ORAL_TABLET | ORAL | Status: DC | PRN

## 2021-05-16 MED ORDER — PROPOFOL 10 MG/ML IV BOLUS
INTRAVENOUS | Status: DC | PRN
  Administered 2021-05-16: 70 mg via INTRAVENOUS

## 2021-05-16 MED ORDER — DILTIAZEM HCL ER COATED BEADS 180 MG PO CP24
180.0000 mg | ORAL_CAPSULE | Freq: Every day | ORAL | Status: DC
  Administered 2021-05-17: 180 mg via ORAL
  Filled 2021-05-16: qty 1

## 2021-05-16 MED ORDER — CHLORHEXIDINE GLUCONATE 0.12 % MT SOLN
15.0000 mL | Freq: Once | OROMUCOSAL | Status: AC
  Administered 2021-05-16: 15 mL via OROMUCOSAL

## 2021-05-16 MED ORDER — FENTANYL CITRATE (PF) 100 MCG/2ML IJ SOLN
INTRAMUSCULAR | Status: DC | PRN
  Administered 2021-05-16: 50 ug via INTRAVENOUS
  Administered 2021-05-16 (×2): 25 ug via INTRAVENOUS

## 2021-05-16 MED ORDER — ACETAMINOPHEN 650 MG RE SUPP: 650.0000 mg | Freq: Four times a day (QID) | RECTAL | Status: DC | PRN

## 2021-05-16 MED ORDER — ONDANSETRON HCL 4 MG/2ML IJ SOLN: 4.0000 mg | Freq: Four times a day (QID) | INTRAMUSCULAR | Status: DC | PRN

## 2021-05-16 MED ORDER — CALCIUM CARBONATE 1250 (500 CA) MG PO TABS
2.0000 | ORAL_TABLET | Freq: Three times a day (TID) | ORAL | Status: DC
  Administered 2021-05-16 – 2021-05-17 (×3): 1000 mg via ORAL
  Filled 2021-05-16 (×3): qty 1

## 2021-05-16 SURGICAL SUPPLY — 31 items
ADH SKN CLS APL DERMABOND .7 (GAUZE/BANDAGES/DRESSINGS) ×1
APL PRP STRL LF DISP 70% ISPRP (MISCELLANEOUS) ×1
ATTRACTOMAT 16X20 MAGNETIC DRP (DRAPES) ×2 IMPLANT
BAG COUNTER SPONGE SURGICOUNT (BAG) ×1 IMPLANT
BAG SPNG CNTER NS LX DISP (BAG)
BLADE SURG 15 STRL LF DISP TIS (BLADE) ×1 IMPLANT
BLADE SURG 15 STRL SS (BLADE) ×2
CHLORAPREP W/TINT 26 (MISCELLANEOUS) ×2 IMPLANT
CLIP TI MEDIUM 6 (CLIP) ×5 IMPLANT
CLIP TI WIDE RED SMALL 6 (CLIP) ×6 IMPLANT
COVER SURGICAL LIGHT HANDLE (MISCELLANEOUS) ×2 IMPLANT
DERMABOND ADVANCED (GAUZE/BANDAGES/DRESSINGS) ×1
DERMABOND ADVANCED .7 DNX12 (GAUZE/BANDAGES/DRESSINGS) ×1 IMPLANT
DRAPE LAPAROTOMY T 98X78 PEDS (DRAPES) ×2 IMPLANT
DRAPE UTILITY XL STRL (DRAPES) ×2 IMPLANT
ELECT PENCIL ROCKER SW 15FT (MISCELLANEOUS) ×2 IMPLANT
ELECT REM PT RETURN 15FT ADLT (MISCELLANEOUS) ×2 IMPLANT
GAUZE 4X4 16PLY ~~LOC~~+RFID DBL (SPONGE) ×2 IMPLANT
GOWN STRL REUS W/TWL XL LVL3 (GOWN DISPOSABLE) ×4 IMPLANT
HEMOSTAT SURGICEL 2X4 FIBR (HEMOSTASIS) ×2 IMPLANT
ILLUMINATOR WAVEGUIDE N/F (MISCELLANEOUS) ×2 IMPLANT
KIT BASIN OR (CUSTOM PROCEDURE TRAY) ×2 IMPLANT
KIT TURNOVER KIT A (KITS) ×1 IMPLANT
PACK BASIC VI WITH GOWN DISP (CUSTOM PROCEDURE TRAY) ×2 IMPLANT
SHEARS HARMONIC 9CM CVD (BLADE) ×2 IMPLANT
SUT MNCRL AB 4-0 PS2 18 (SUTURE) ×2 IMPLANT
SUT VIC AB 3-0 SH 18 (SUTURE) ×4 IMPLANT
SYR BULB IRRIG 60ML STRL (SYRINGE) ×2 IMPLANT
TOWEL OR 17X26 10 PK STRL BLUE (TOWEL DISPOSABLE) ×2 IMPLANT
TOWEL OR NON WOVEN STRL DISP B (DISPOSABLE) ×2 IMPLANT
TUBING CONNECTING 10 (TUBING) ×2 IMPLANT

## 2021-05-16 NOTE — Interval H&P Note (Signed)
History and Physical Interval Note:  05/16/2021 9:19 AM  Jill Mcdowell  has presented today for surgery, with the diagnosis of thyroid neoplasm.  The various methods of treatment have been discussed with the patient and family. After consideration of risks, benefits and other options for treatment, the patient has consented to    Procedure(s): TOTAL THYROIDECTOMY (N/A) as a surgical intervention.    The patient's history has been reviewed, patient examined, no change in status, stable for surgery.  I have reviewed the patient's chart and labs.  Questions were answered to the patient's satisfaction.    Armandina Gemma, [facility] [place] practice Office: [place]

## 2021-05-16 NOTE — Transfer of Care (Signed)
Immediate Anesthesia Transfer of Care Note  Patient: Jill Mcdowell  Procedure(s) Performed: TOTAL THYROIDECTOMY (Neck)  Patient Location: PACU  Anesthesia Type:General  Level of Consciousness: awake, alert , oriented and patient cooperative  Airway & Oxygen Therapy: Patient Spontanous Breathing and Patient connected to face mask oxygen  Post-op Assessment: Report given to RN, Post -op Vital signs reviewed and stable and Patient moving all extremities  Post vital signs: Reviewed and stable  Last Vitals:  Vitals Value Taken Time  BP 139/78 05/16/21 1147  Temp    Pulse 73 05/16/21 1148  Resp 18 05/16/21 1148  SpO2 96 % 05/16/21 1148  Vitals shown include unvalidated device data.  Last Pain:  Vitals:   05/16/21 0808  TempSrc: Oral  PainSc:          Complications: No notable events documented.

## 2021-05-16 NOTE — Op Note (Signed)
Procedure Note  Pre-operative Diagnosis:  thyroid neoplasm of uncertain behavior, multiple thyroid nodules  Post-operative Diagnosis:  same  Surgeon:  Armandina Gemma, MD  Assistant:  Carlena Hurl, PA-C   Procedure:  Total thyroidectomy  Anesthesia:  General  Estimated Blood Loss:  minimal  Drains: none         Specimen: thyroid to pathology  Indications:  Patient is referred by Dr. Madelin Rear for surgical evaluation and management of a newly diagnosed thyroid neoplasm of uncertain behavior. Patient's primary care physician is Dr. Milagros Evener. Patient has a longstanding history of hypothyroidism. She has been on levothyroxine for approximately 15 years. On recent physical exam the patient was noted to have a right neck mass. She underwent an ultrasound demonstrating mild enlargement of the right thyroid lobe measuring 5.0 x 1.6 x 2.8 cm. Left thyroid lobe was normal in size. However there were bilateral thyroid nodules. The dominant nodule on the right measured 1.7 cm and was felt to be moderately suspicious. Patient underwent fine-needle aspiration biopsy on February 15, 2021. This demonstrated atypia of undetermined significance, Bethesda category III. The specimen was subsequently submitted for molecular genetic analysis, AFIRMA, and returned with a result of suspicious, rendering a risk of malignancy of 50%. Patient is therefore referred for consideration for resection for definitive diagnosis and management.  Procedure Details: Procedure was done in OR #1 at the [facility]. The patient was brought to the operating room and placed in a supine position on the operating room table. Following administration of general anesthesia, the patient was positioned and then prepped and draped in the usual aseptic fashion. After ascertaining that an adequate level of anesthesia had been achieved, a small Kocher incision was made with #15 blade. Dissection was carried through subcutaneous  tissues and platysma.Hemostasis was achieved with the electrocautery. Skin flaps were elevated cephalad and caudad from the thyroid notch to the sternal notch. A Mahorner self-retaining retractor was placed for exposure. Strap muscles were incised in the midline and dissection was begun on the left side.  Strap muscles were reflected laterally.  Left thyroid lobe was small and slightly nodular.  The left lobe was gently mobilized with blunt dissection. Superior pole vessels were dissected out and divided individually between small and medium ligaclips with the harmonic scalpel. The thyroid lobe was rolled anteriorly. Branches of the inferior thyroid artery were divided between small ligaclips with the harmonic scalpel. Inferior venous tributaries were divided between ligaclips. Both the superior and inferior parathyroid glands were identified and preserved on their vascular pedicles. The recurrent laryngeal nerve was identified and preserved along its course. The ligament of Gwenlyn Found was released with the electrocautery and the gland was mobilized onto the anterior trachea. Isthmus was mobilized across the midline. There was a long thin pyramidal lobe present which was dissected off of the thyroid cartilage and resected with the isthmus. Dry pack was placed in the left neck.  The right thyroid lobe was gently mobilized with blunt dissection. Right thyroid lobe was moderately enlarged and multinodular. Superior pole vessels were dissected out and divided between small and medium ligaclips with the Harmonic scalpel. Superior parathyroid was identified and preserved. Inferior venous tributaries were divided between medium ligaclips with the harmonic scalpel. The right thyroid lobe was rolled anteriorly and the branches of the inferior thyroid artery divided between small ligaclips. The right recurrent laryngeal nerve was identified and preserved along its course. The ligament of Gwenlyn Found was released with the  electrocautery. The right thyroid lobe  was mobilized onto the anterior trachea and the remainder of the thyroid was dissected off the anterior trachea and the thyroid was completely excised. A suture was used to mark the left lobe. The entire thyroid gland was submitted to pathology for review.  The neck was irrigated with warm saline. Fibrillar was placed throughout the operative field. Strap muscles were approximated in the midline with interrupted 3-0 Vicryl sutures. Platysma was closed with interrupted 3-0 Vicryl sutures. Skin was closed with a running 4-0 Monocryl subcuticular suture. Wound was washed and Dermabond was applied. The patient was awakened from anesthesia and brought to the recovery room. The patient tolerated the procedure well.   Armandina Gemma, MD [facility] Office: [phone]

## 2021-05-16 NOTE — Anesthesia Procedure Notes (Signed)
Procedure Name: Intubation Date/Time: 05/16/2021 9:56 AM Performed by: [place] Io Dieujuste C, CRNA Pre-anesthesia Checklist: Patient identified, Emergency Drugs available, Suction available and Patient being monitored Patient Re-evaluated:Patient Re-evaluated prior to induction Oxygen Delivery Method: Circle system utilized Preoxygenation: Pre-oxygenation with 100% oxygen Induction Type: IV induction Ventilation: Mask ventilation without difficulty Laryngoscope Size: Mac and 3 Grade View: Grade I Tube type: Oral Tube size: 7.0 mm Number of attempts: 1 Airway Equipment and Method: Stylet and Oral airway Placement Confirmation: ETT inserted through vocal cords under direct vision, positive ETCO2 and breath sounds checked- equal and bilateral Secured at: 20 cm Tube secured with: Tape Dental Injury: Teeth and Oropharynx as per pre-operative assessment

## 2021-05-16 NOTE — Anesthesia Postprocedure Evaluation (Signed)
Anesthesia Post Note  Patient: KYLANI WIRES  Procedure(s) Performed: TOTAL THYROIDECTOMY (Neck)     Patient location during evaluation: PACU Anesthesia Type: General Level of consciousness: awake and alert and oriented Pain management: pain level controlled Vital Signs Assessment: post-procedure vital signs reviewed and stable Respiratory status: spontaneous breathing, nonlabored ventilation and respiratory function stable Cardiovascular status: blood pressure returned to baseline Postop Assessment: no apparent nausea or vomiting Anesthetic complications: no   No notable events documented.  Last Vitals:  Vitals:   05/16/21 1215 05/16/21 1230  BP: (!) 158/80 (!) 151/68  Pulse: 83 75  Resp: 13 13  Temp:  36.4 C  SpO2: 98% 97%    Last Pain:  Vitals:   05/16/21 1151  TempSrc:   PainSc: [place]

## 2021-05-17 ENCOUNTER — Encounter (HOSPITAL_COMMUNITY): Payer: Self-pay | Admitting: Surgery

## 2021-05-17 DIAGNOSIS — E039 Hypothyroidism, unspecified: Secondary | ICD-10-CM | POA: Diagnosis not present

## 2021-05-17 DIAGNOSIS — R3915 Urgency of urination: Secondary | ICD-10-CM | POA: Diagnosis not present

## 2021-05-17 DIAGNOSIS — R350 Frequency of micturition: Secondary | ICD-10-CM | POA: Diagnosis not present

## 2021-05-17 DIAGNOSIS — I4891 Unspecified atrial fibrillation: Secondary | ICD-10-CM | POA: Diagnosis not present

## 2021-05-17 DIAGNOSIS — C73 Malignant neoplasm of thyroid gland: Secondary | ICD-10-CM | POA: Diagnosis not present

## 2021-05-17 DIAGNOSIS — Z87891 Personal history of nicotine dependence: Secondary | ICD-10-CM | POA: Diagnosis not present

## 2021-05-17 LAB — URINE CULTURE
Culture: NO GROWTH
Special Requests: NORMAL

## 2021-05-17 LAB — BASIC METABOLIC PANEL
Anion gap: 6 (ref 5–15)
BUN: 9 mg/dL (ref 8–23)
CO2: 25 mmol/L (ref 22–32)
Calcium: 8 mg/dL — ABNORMAL LOW (ref 8.9–10.3)
Chloride: 105 mmol/L (ref 98–111)
Creatinine, Ser: 0.64 mg/dL (ref 0.44–1.00)
GFR, Estimated: >60 mL/min (ref >60)
Glucose, Bld: 104 mg/dL — ABNORMAL HIGH (ref 70–99)
Potassium: 4 mmol/L (ref 3.5–5.1)
Sodium: 136 mmol/L (ref 135–145)

## 2021-05-17 MED ORDER — CALCIUM GLUCONATE-NACL 2-0.675 GM/100ML-% IV SOLN
2.0000 g | INTRAVENOUS | Status: AC
  Administered 2021-05-17: 2000 mg via INTRAVENOUS
  Filled 2021-05-17: qty 100

## 2021-05-17 MED ORDER — TRAMADOL HCL 50 MG PO TABS: 50.0000 mg | ORAL_TABLET | Freq: Four times a day (QID) | ORAL | 0 refills | Status: DC | PRN

## 2021-05-17 MED ORDER — CALCIUM CARBONATE 1250 (500 CA) MG PO TABS: 2.0000 | ORAL_TABLET | Freq: Four times a day (QID) | ORAL | 1 refills | Status: DC

## 2021-05-17 MED ORDER — CALCIUM CARBONATE ANTACID 500 MG PO CHEW: 2.0000 | CHEWABLE_TABLET | Freq: Three times a day (TID) | ORAL | 1 refills | Status: DC

## 2021-05-17 MED ORDER — CALCITRIOL 0.25 MCG PO CAPS
0.2500 ug | ORAL_CAPSULE | Freq: Every day | ORAL | 0 refills | Status: DC
Start: 2021-05-17 — End: 2022-05-30

## 2021-05-17 NOTE — Progress Notes (Signed)
Assessment unchanged. Pt and family verbalized understanding of dc instructions through teach back. Discharged d via wc to front entrance accompanied by NT and family.

## 2021-05-17 NOTE — Discharge Instructions (Signed)
[facility] - Dr. Steaven Wholey  THYROID & PARATHYROID SURGERY:  POST-OP INSTRUCTIONS  Always review the instruction sheet provided by the hospital nurse at discharge.  A prescription for pain medication may be sent to your pharmacy at the time of discharge.  Take your pain medication as prescribed.  If narcotic pain medicine is not needed, then you may take acetaminophen (Tylenol) or ibuprofen (Advil) as needed for pain or soreness.  Take your normal home medications as prescribed unless otherwise directed.  If you need a refill on your pain medication, please contact the office during regular business hours.  Prescriptions will not be processed by the office after 5:00PM or on weekends.  Start with a light diet upon arrival home, such as soup and crackers or toast.  Be sure to drink plenty of fluids.  Resume your normal diet the day after surgery.  Most patients will experience some swelling and bruising on the chest and neck area.  Ice packs will help for the first 48 hours after arriving home.  Swelling and bruising will take several days to resolve.   It is common to experience some constipation after surgery.  Increasing fluid intake and taking a stool softener (Colace) will usually help to prevent this problem.  A mild laxative (Milk of Magnesia or Miralax) should be taken according to package directions if there has been no bowel movement after 48 hours.  Dermabond glue covers your incision. This seals the wound and you may shower at any time. The Dermabond will remain in place for about a week.  You may gradually remove the glue when it loosens around the edges.  If you need to loosen the Dermabond for removal, apply a layer of Vaseline to the wound for 15 minutes and then remove with a Kleenex. Your sutures are under the skin and will not show - they will dissolve on their own.  You may resume light daily activities beginning the day after discharge (such as self-care,  walking, climbing stairs), gradually increasing activities as tolerated. You may have sexual intercourse when it is comfortable. Refrain from any heavy lifting or straining until approved by your doctor. You may drive when you no longer are taking prescription pain medication, you can comfortably wear a seatbelt, and you can safely maneuver your car and apply the brakes.  You will see your doctor in the office for a follow-up appointment approximately three weeks after your surgery.  Make sure that you call for this appointment within a day or two after you arrive home to insure a convenient appointment time. Please have any requested laboratory tests performed a few days prior to your office visit so that the results will be available at your follow up appointment.  WHEN TO CALL THE [facility] OFFICE: -- Fever greater than 101.5 -- Inability to urinate -- Nausea and/or vomiting - persistent -- Extreme swelling or bruising -- Continued bleeding from incision -- Increased pain, redness, or drainage from the incision -- Difficulty swallowing or breathing -- Muscle cramping or spasms -- Numbness or tingling in hands or around lips  The clinic staff is available to answer your questions during regular business hours.  Please don't hesitate to call and ask to speak to one of the nurses if you have concerns.  [facility] OFFICE: [phone] (24 hours)  Please sign up for MyChart accounts. This will allow you to communicate directly with my nurse or myself without having to call the office. It will also allow you   to view your test results. You will need to enroll in MyChart for my office ([facility]) and for the hospital ([place]).  Kohana Amble, MD [facility] practice 

## 2021-05-17 NOTE — Discharge Summary (Signed)
Physician Discharge Summary   Patient ID: GLORIANNE PROCTOR MRN: [id number] DOB/AGE: 1927-03-23 87 y.o.  Admit date: 05/16/2021  Discharge date: 05/17/2021  Discharge Diagnoses:  Principal Problem:   Neoplasm of uncertain behavior of thyroid gland Active Problems:   Multiple thyroid nodules   Hypothyroidism   Discharged Condition: good  Hospital Course: Patient was admitted for observation following thyroid surgery.  Post op course was uncomplicated.  Pain was well controlled.  Tolerated diet.  Post op calcium level on morning following surgery was 8.0 mg/dl.  Patient was given calcium gluconate 2 gm IVPB prior to discharge.  Patient was prepared for discharge home on POD#1.   Consults: None  Treatments: surgery: total thyroidectomy  Discharge Exam: Blood pressure 120/67, pulse 81, temperature 98.7 F (37.1 C), temperature source Oral, resp. rate 16, height 5' (1.524 m), weight 50.1 kg, SpO2 94 %. HEENT - clear Neck - wound dry and intact; mild ecchymosis, STS; voice with mild hoarseness, no stridor Chest - clear bilaterally Cor - RRR  Disposition: Home  Discharge Instructions     Diet - low sodium heart healthy   Complete by: As directed    Increase activity slowly   Complete by: As directed    No dressing needed   Complete by: As directed       Allergies as of 05/17/2021       Reactions   Latex Other (See Comments)   Skin irritation   Metoprolol Other (See Comments)   hallucinations   Sulfa Antibiotics Hives        Medication List     TAKE these medications    acetaminophen 500 MG tablet Commonly known as: TYLENOL Take 500 mg by mouth every 8 (eight) hours as needed for headache.   calcitRIOL 0.25 MCG capsule Commonly known as: Rocaltrol Take 1 capsule (0.25 mcg total) by mouth daily.   calcium carbonate 1250 (500 Ca) MG tablet Commonly known as: OS-CAL - dosed in mg of elemental calcium Take 2 tablets (1,000 mg of elemental calcium total)  by mouth in the morning, at noon, in the evening, and at bedtime.   Dilt-XR 180 MG 24 hr capsule Generic drug: diltiazem TAKE 1 CAPSULE EVERY DAY   iron polysaccharides 150 MG capsule Commonly known as: NIFEREX Take 150 mg by mouth every Monday, Wednesday, and Friday.   levothyroxine 50 MCG tablet Commonly known as: SYNTHROID Take 50 mcg by mouth daily before breakfast.   loratadine 10 MG tablet Commonly known as: CLARITIN Take 10 mg by mouth daily as needed for allergies.   Lumify 0.025 % Soln Generic drug: Brimonidine Tartrate Place 1 drop into both eyes daily.   omeprazole 20 MG capsule Commonly known as: PRILOSEC Take 20 mg by mouth daily with lunch.   polyethylene glycol powder 17 GM/SCOOP powder Commonly known as: GLYCOLAX/MIRALAX Take 17 g by mouth daily.   PRESERVISION AREDS PO Take 1 capsule by mouth 2 (two) times daily.   traMADol 50 MG tablet Commonly known as: ULTRAM Take 1-2 tablets (50-100 mg total) by mouth every 6 (six) hours as needed for moderate pain.   Tums 500 MG chewable tablet Generic drug: calcium carbonate Chew 3 tablets by mouth 3 (three) times daily. What changed: Another medication with the same name was added. Make sure you understand how and when to take each.   calcium carbonate 500 MG chewable tablet Commonly known as: Tums Chew 2 tablets (400 mg of elemental calcium total) by mouth 3 (three)  times daily. What changed: You were already taking a medication with the same name, and this prescription was added. Make sure you understand how and when to take each.   calcium carbonate 500 MG chewable tablet Commonly known as: Tums Chew 2 tablets (400 mg of elemental calcium total) by mouth 3 (three) times daily. What changed: You were already taking a medication with the same name, and this prescription was added. Make sure you understand how and when to take each.   VITAMIN D-3 PO Take 10,000 Units by mouth every Friday.                Discharge Care Instructions  (From admission, onward)           Start     Ordered   05/17/21 0000  No dressing needed        05/17/21 1209            Follow-up Information     Armandina Gemma, MD. Schedule an appointment as soon as possible for a visit in 3 week(s).   Specialty: General Surgery Why: For wound re-check Contact information: [place] [address] [phone]                 Ovella Manygoats, [facility] Office: [phone]   Signed: Armandina Gemma 05/17/2021, 12:10 PM

## 2021-05-18 LAB — SURGICAL PATHOLOGY

## 2021-05-20 NOTE — Progress Notes (Signed)
Pathology with small papillary thyroid carcinoma, negative margins.  Should have a favorable prognosis, but will ask Dr. Garnet Koyanagi to review.  Armandina Gemma, MD [facility] [place] practice Office: [phone]

## 2021-05-23 DIAGNOSIS — Z1159 Encounter for screening for other viral diseases: Secondary | ICD-10-CM | POA: Diagnosis not present

## 2021-05-23 DIAGNOSIS — Z20828 Contact with and (suspected) exposure to other viral communicable diseases: Secondary | ICD-10-CM | POA: Diagnosis not present

## 2021-05-27 NOTE — Progress Notes (Signed)
     Nurse reported urinary frequency with cloudy urine.  Sent for culture for possible UTI.  Armandina Gemma, MD [facility] A [place] practice Office: [phone]

## 2021-05-30 DIAGNOSIS — Z20828 Contact with and (suspected) exposure to other viral communicable diseases: Secondary | ICD-10-CM | POA: Diagnosis not present

## 2021-05-30 DIAGNOSIS — Z1159 Encounter for screening for other viral diseases: Secondary | ICD-10-CM | POA: Diagnosis not present

## 2021-05-31 DIAGNOSIS — D440 Neoplasm of uncertain behavior of thyroid gland: Secondary | ICD-10-CM | POA: Diagnosis not present

## 2021-05-31 DIAGNOSIS — E042 Nontoxic multinodular goiter: Secondary | ICD-10-CM | POA: Diagnosis not present

## 2021-05-31 DIAGNOSIS — E890 Postprocedural hypothyroidism: Secondary | ICD-10-CM | POA: Diagnosis not present

## 2021-06-06 DIAGNOSIS — Z20828 Contact with and (suspected) exposure to other viral communicable diseases: Secondary | ICD-10-CM | POA: Diagnosis not present

## 2021-06-06 DIAGNOSIS — Z1159 Encounter for screening for other viral diseases: Secondary | ICD-10-CM | POA: Diagnosis not present

## 2021-06-08 ENCOUNTER — Other Ambulatory Visit: Payer: Self-pay

## 2021-06-08 ENCOUNTER — Ambulatory Visit: Payer: Medicare HMO | Admitting: Dermatology

## 2021-06-08 ENCOUNTER — Encounter: Payer: Self-pay | Admitting: Dermatology

## 2021-06-08 DIAGNOSIS — L720 Epidermal cyst: Secondary | ICD-10-CM

## 2021-06-08 DIAGNOSIS — Z8589 Personal history of malignant neoplasm of other organs and systems: Secondary | ICD-10-CM

## 2021-06-08 DIAGNOSIS — Q828 Other specified congenital malformations of skin: Secondary | ICD-10-CM

## 2021-06-08 DIAGNOSIS — Z1283 Encounter for screening for malignant neoplasm of skin: Secondary | ICD-10-CM

## 2021-06-08 DIAGNOSIS — Z85828 Personal history of other malignant neoplasm of skin: Secondary | ICD-10-CM | POA: Diagnosis not present

## 2021-06-09 DIAGNOSIS — E039 Hypothyroidism, unspecified: Secondary | ICD-10-CM | POA: Diagnosis not present

## 2021-06-09 DIAGNOSIS — C73 Malignant neoplasm of thyroid gland: Secondary | ICD-10-CM | POA: Diagnosis not present

## 2021-06-09 DIAGNOSIS — I480 Paroxysmal atrial fibrillation: Secondary | ICD-10-CM | POA: Diagnosis not present

## 2021-06-09 DIAGNOSIS — E890 Postprocedural hypothyroidism: Secondary | ICD-10-CM | POA: Diagnosis not present

## 2021-06-13 DIAGNOSIS — Z20828 Contact with and (suspected) exposure to other viral communicable diseases: Secondary | ICD-10-CM | POA: Diagnosis not present

## 2021-06-13 DIAGNOSIS — E890 Postprocedural hypothyroidism: Secondary | ICD-10-CM | POA: Diagnosis not present

## 2021-06-13 DIAGNOSIS — C73 Malignant neoplasm of thyroid gland: Secondary | ICD-10-CM | POA: Diagnosis not present

## 2021-06-13 DIAGNOSIS — Z1159 Encounter for screening for other viral diseases: Secondary | ICD-10-CM | POA: Diagnosis not present

## 2021-06-13 DIAGNOSIS — I493 Ventricular premature depolarization: Secondary | ICD-10-CM | POA: Diagnosis not present

## 2021-06-15 DIAGNOSIS — Z20828 Contact with and (suspected) exposure to other viral communicable diseases: Secondary | ICD-10-CM | POA: Diagnosis not present

## 2021-06-15 DIAGNOSIS — Z1159 Encounter for screening for other viral diseases: Secondary | ICD-10-CM | POA: Diagnosis not present

## 2021-06-20 DIAGNOSIS — Z20828 Contact with and (suspected) exposure to other viral communicable diseases: Secondary | ICD-10-CM | POA: Diagnosis not present

## 2021-06-20 DIAGNOSIS — Z1159 Encounter for screening for other viral diseases: Secondary | ICD-10-CM | POA: Diagnosis not present

## 2021-06-27 DIAGNOSIS — Z20828 Contact with and (suspected) exposure to other viral communicable diseases: Secondary | ICD-10-CM | POA: Diagnosis not present

## 2021-06-29 DIAGNOSIS — Z1159 Encounter for screening for other viral diseases: Secondary | ICD-10-CM | POA: Diagnosis not present

## 2021-06-29 DIAGNOSIS — Z20828 Contact with and (suspected) exposure to other viral communicable diseases: Secondary | ICD-10-CM | POA: Diagnosis not present

## 2021-07-03 ENCOUNTER — Encounter: Payer: Self-pay | Admitting: Dermatology

## 2021-07-03 NOTE — Progress Notes (Signed)
° °  Follow-Up Visit   Subjective  Jill Mcdowell is a 88 y.o. female who presents for the following: Annual Exam (Left lower leg x months- scaly).  Several areas of concern including scaly spot on left shin, general skin check Location:  Duration:  Quality:  Associated Signs/Symptoms: Modifying Factors:  Severity:  Timing: Context:   Objective  Well appearing patient in no apparent distress; mood and affect are within normal limits. Neck - Anterior Waxy pink 6 mm crust near but not contiguous with white scar     Right Supraorbital Region Firm white half millimeter upper dermal papule  Left Lower Leg - Anterior Being 8 mm crust, clinically good for porokeratosis, actinic keratosis, irritated seborrheic keratosis, more superficial carcinoma.  Currently asymptomatic and stable.    All sun exposed areas plus back examined.   Assessment & Plan    History of squamous cell carcinoma Neck - Anterior  Defer biopsy due to holiday   Milia Right Supraorbital Region  No intervention currently necessary  Porokeratosis Left Lower Leg - Anterior  Intervention deferred.      I, Lavonna Monarch, MD, have reviewed all documentation for this visit.  The documentation on 07/03/21 for the exam, diagnosis, procedures, and orders are all accurate and complete.

## 2021-07-04 DIAGNOSIS — Z20822 Contact with and (suspected) exposure to covid-19: Secondary | ICD-10-CM | POA: Diagnosis not present

## 2021-07-06 DIAGNOSIS — Z20828 Contact with and (suspected) exposure to other viral communicable diseases: Secondary | ICD-10-CM | POA: Diagnosis not present

## 2021-07-06 DIAGNOSIS — Z1159 Encounter for screening for other viral diseases: Secondary | ICD-10-CM | POA: Diagnosis not present

## 2021-07-11 DIAGNOSIS — Z20822 Contact with and (suspected) exposure to covid-19: Secondary | ICD-10-CM | POA: Diagnosis not present

## 2021-07-18 DIAGNOSIS — Z20828 Contact with and (suspected) exposure to other viral communicable diseases: Secondary | ICD-10-CM | POA: Diagnosis not present

## 2021-07-25 DIAGNOSIS — Z20828 Contact with and (suspected) exposure to other viral communicable diseases: Secondary | ICD-10-CM | POA: Diagnosis not present

## 2021-08-01 DIAGNOSIS — Z20822 Contact with and (suspected) exposure to covid-19: Secondary | ICD-10-CM | POA: Diagnosis not present

## 2021-08-04 ENCOUNTER — Other Ambulatory Visit: Payer: Self-pay

## 2021-08-04 ENCOUNTER — Ambulatory Visit (INDEPENDENT_AMBULATORY_CARE_PROVIDER_SITE_OTHER): Payer: Medicare HMO | Admitting: Dermatology

## 2021-08-04 DIAGNOSIS — L820 Inflamed seborrheic keratosis: Secondary | ICD-10-CM

## 2021-08-04 DIAGNOSIS — D044 Carcinoma in situ of skin of scalp and neck: Secondary | ICD-10-CM

## 2021-08-04 DIAGNOSIS — D485 Neoplasm of uncertain behavior of skin: Secondary | ICD-10-CM

## 2021-08-04 DIAGNOSIS — L821 Other seborrheic keratosis: Secondary | ICD-10-CM

## 2021-08-04 NOTE — Patient Instructions (Signed)

## 2021-08-22 DIAGNOSIS — Z8616 Personal history of COVID-19: Secondary | ICD-10-CM | POA: Diagnosis not present

## 2021-08-23 ENCOUNTER — Encounter: Payer: Self-pay | Admitting: Dermatology

## 2021-08-23 NOTE — Progress Notes (Signed)
° °  Follow-Up Visit   Subjective  Jill Mcdowell is a 88 y.o. female who presents for the following: Procedure (Pt here for bx of the right neck and tx ok the brow).  Crust on right side of neck and left eyebrow Location:  Duration:  Quality:  Associated Signs/Symptoms: Modifying Factors:  Severity:  Timing: Context:   Objective  Well appearing patient in no apparent distress; mood and affect are within normal limits. Right Neck 1 cm waxy crust right front neck, probable CIS.  Left Eyebrow A pink-tan slightly inflamed flattopped keratotic 8 mm papule    A focused examination was performed including head and neck. Relevant physical exam findings are noted in the Assessment and Plan.   Assessment & Plan    Neoplasm of uncertain behavior of skin Right Neck  Skin / nail biopsy Type of biopsy: tangential   Informed consent: discussed and consent obtained   Timeout: patient name, date of birth, surgical site, and procedure verified   Anesthesia: the lesion was anesthetized in a standard fashion   Anesthetic:  1% lidocaine w/ epinephrine 1-100,000 local infiltration Instrument used: flexible razor blade   Hemostasis achieved with: ferric subsulfate and electrodesiccation   Outcome: patient tolerated procedure well   Post-procedure details: wound care instructions given    Destruction of lesion Complexity: simple   Destruction method: electrodesiccation and curettage   Informed consent: discussed and consent obtained   Timeout:  patient name, date of birth, surgical site, and procedure verified Anesthesia: the lesion was anesthetized in a standard fashion   Anesthetic:  1% lidocaine w/ epinephrine 1-100,000 local infiltration Curettage performed in three different directions: Yes   Curettage cycles:  3 Lesion length (cm):  1.5 Lesion width (cm):  1.5 Margin per side (cm):  0 Final wound size (cm):  1.5 Hemostasis achieved with:  ferric subsulfate Outcome: patient  tolerated procedure well with no complications   Post-procedure details: wound care instructions given    Specimen 1 - Surgical pathology Differential Diagnosis: R/O BCC VS SCC - TXPBX  Check Margins: No  After shave biopsy the base of the lesion was treated with curettage plus cautery.  Inflamed seborrheic keratosis Left Eyebrow  Patient told while that freezing frequently eliminates visible inflammation or itching, it does not always eliminate the growth.  Although these may resemble sun damage, I believe the risk of skin cancers low  Destruction of lesion - Left Eyebrow Complexity: simple   Destruction method: cryotherapy   Informed consent: discussed and consent obtained   Timeout:  patient name, date of birth, surgical site, and procedure verified Lesion destroyed using liquid nitrogen: Yes   Cryotherapy cycles:  3 Outcome: patient tolerated procedure well with no complications   Post-procedure details: wound care instructions given        I, Lavonna Monarch, MD, have reviewed all documentation for this visit.  The documentation on 08/23/21 for the exam, diagnosis, procedures, and orders are all accurate and complete.

## 2021-08-29 DIAGNOSIS — Z20822 Contact with and (suspected) exposure to covid-19: Secondary | ICD-10-CM | POA: Diagnosis not present

## 2021-08-31 DIAGNOSIS — E890 Postprocedural hypothyroidism: Secondary | ICD-10-CM | POA: Diagnosis not present

## 2021-08-31 DIAGNOSIS — C73 Malignant neoplasm of thyroid gland: Secondary | ICD-10-CM | POA: Diagnosis not present

## 2021-09-05 DIAGNOSIS — Z20822 Contact with and (suspected) exposure to covid-19: Secondary | ICD-10-CM | POA: Diagnosis not present

## 2021-09-09 DIAGNOSIS — E039 Hypothyroidism, unspecified: Secondary | ICD-10-CM | POA: Diagnosis not present

## 2021-09-09 DIAGNOSIS — I480 Paroxysmal atrial fibrillation: Secondary | ICD-10-CM | POA: Diagnosis not present

## 2021-09-09 DIAGNOSIS — C73 Malignant neoplasm of thyroid gland: Secondary | ICD-10-CM | POA: Diagnosis not present

## 2021-09-09 DIAGNOSIS — E890 Postprocedural hypothyroidism: Secondary | ICD-10-CM | POA: Diagnosis not present

## 2021-09-12 DIAGNOSIS — Z20822 Contact with and (suspected) exposure to covid-19: Secondary | ICD-10-CM | POA: Diagnosis not present

## 2021-09-19 DIAGNOSIS — Z20822 Contact with and (suspected) exposure to covid-19: Secondary | ICD-10-CM | POA: Diagnosis not present

## 2021-09-26 DIAGNOSIS — Z20822 Contact with and (suspected) exposure to covid-19: Secondary | ICD-10-CM | POA: Diagnosis not present

## 2021-10-03 DIAGNOSIS — Z20822 Contact with and (suspected) exposure to covid-19: Secondary | ICD-10-CM | POA: Diagnosis not present

## 2021-10-10 DIAGNOSIS — Z20822 Contact with and (suspected) exposure to covid-19: Secondary | ICD-10-CM | POA: Diagnosis not present

## 2021-10-17 DIAGNOSIS — Z20822 Contact with and (suspected) exposure to covid-19: Secondary | ICD-10-CM | POA: Diagnosis not present

## 2021-10-24 DIAGNOSIS — Z20822 Contact with and (suspected) exposure to covid-19: Secondary | ICD-10-CM | POA: Diagnosis not present

## 2021-10-31 DIAGNOSIS — Z20822 Contact with and (suspected) exposure to covid-19: Secondary | ICD-10-CM | POA: Diagnosis not present

## 2021-11-03 DIAGNOSIS — H353132 Nonexudative age-related macular degeneration, bilateral, intermediate dry stage: Secondary | ICD-10-CM | POA: Diagnosis not present

## 2021-11-03 DIAGNOSIS — Z961 Presence of intraocular lens: Secondary | ICD-10-CM | POA: Diagnosis not present

## 2021-11-07 DIAGNOSIS — Z8679 Personal history of other diseases of the circulatory system: Secondary | ICD-10-CM | POA: Diagnosis not present

## 2021-11-07 DIAGNOSIS — E039 Hypothyroidism, unspecified: Secondary | ICD-10-CM | POA: Diagnosis not present

## 2021-11-07 DIAGNOSIS — E7800 Pure hypercholesterolemia, unspecified: Secondary | ICD-10-CM | POA: Diagnosis not present

## 2021-11-07 DIAGNOSIS — I493 Ventricular premature depolarization: Secondary | ICD-10-CM | POA: Diagnosis not present

## 2021-11-14 ENCOUNTER — Telehealth: Payer: Self-pay | Admitting: Cardiovascular Disease

## 2021-11-14 NOTE — Telephone Encounter (Signed)
Call returned to the patient. She stated that she was currently asymptomatic. She had two episodes previously of a fast heart rate that lasted a few minutes. She was asked if she would like to come in Wednesday to see Dr. Sallyanne Kuster but stated that she relies on her granddaughter for a ride so it was hard to come.   She was offered an appointment on Friday at 23 with Dr. Sallyanne Kuster. She will talk to her granddaughter and call back.

## 2021-11-14 NOTE — Telephone Encounter (Signed)
Patient reports having 3 spells of palpitations and weakness over the past week. She saw PCP on 5/15 who told her to see cardiologist. No openings at this time. Scheduled patient to see DOD on 5/23. Will call for lab work from PCP.

## 2021-11-14 NOTE — Telephone Encounter (Signed)
Patient called back and would prefer to see Dr. Sallyanne Kuster. Appointment made for Friday 5/26. She has been advised to call back if anything changes.

## 2021-11-14 NOTE — Telephone Encounter (Signed)
Patient c/o Palpitations:  High priority if patient c/o lightheadedness, shortness of breath, or chest pain  How long have you had palpitations/irregular HR/ Afib? Are you having the symptoms now? Week ago  Are you currently experiencing lightheadedness, SOB or CP? No   Do you have a history of afib (atrial fibrillation) or irregular heart rhythm? Yes   Have you checked your BP or HR? (document readings if available): 106/69; high 80's/low 90's  Are you experiencing any other symptoms? tachycardia

## 2021-11-15 ENCOUNTER — Ambulatory Visit: Payer: Medicare HMO | Admitting: Internal Medicine

## 2021-11-18 ENCOUNTER — Ambulatory Visit: Payer: Medicare HMO | Admitting: Cardiovascular Disease

## 2021-11-18 ENCOUNTER — Encounter: Payer: Self-pay | Admitting: Cardiovascular Disease

## 2021-11-18 VITALS — BP 110/70 | HR 82 | Ht 60.0 in | Wt 116.8 lb

## 2021-11-18 DIAGNOSIS — I493 Ventricular premature depolarization: Secondary | ICD-10-CM

## 2021-11-18 DIAGNOSIS — I480 Paroxysmal atrial fibrillation: Secondary | ICD-10-CM | POA: Diagnosis not present

## 2021-11-18 DIAGNOSIS — R079 Chest pain, unspecified: Secondary | ICD-10-CM | POA: Diagnosis not present

## 2021-11-18 MED ORDER — DILTIAZEM HCL 30 MG PO TABS: 30.0000 mg | ORAL_TABLET | Freq: Every day | ORAL | 3 refills | Status: DC | PRN

## 2021-11-18 NOTE — Progress Notes (Signed)
Patient had [facility] with cardiology   Cardiology Office Note    Date:  11/18/2021   ID:  Jill Mcdowell, DOB 11-05-1926, MRN [id number]  PCP:  Jill Nip, MD  Cardiologist:   Jill Klein, MD   No chief complaint on file.   History of Present Illness:  Jill Mcdowell is a 88 y.o. female a remote history of a single episode of atrial fibrillation (reportedly occurred in the 1990s, no firm documentation available).   It's been 2 years since her husband Jill Mcdowell passed away.  She continues to live at [facility].  She is here today with her granddaughter, Jill Mcdowell.  Recently she has been having increasingly frequent palpitations.  These began to be more prominent after she had food poisoning the week before Mother's Day.  She describes this as anywhere from 9-20 beats of rapid palpitations, but they feel regular.  Very rarely sustained beyond a few seconds.  She has been checking her heart rate and the maximum she is recorded was 140 bpm.  During that single episode she had some transient chest tightness that promptly relieved when she put a cold rag on her forehead and the palpitations stopped.  Otherwise the palpitations are only mildly bothersome and do not associate dyspnea, dizziness or syncope.  She denies exertional dyspnea, orthopnea, PND, leg edema, exertional chest pain, neurological complaints, falls or bleeding events.  In the past, her palpitations seem to be associated with monomorphic PVCs.  These always had a right bundle branch block pattern.  She did not tolerate beta-blockers due to visual complaints.  She had hallucinations consistent with Jill Mcdowell syndrome (she would sometimes see a small female figure and other times a stereotypical geometrical pattern).  She is very allergic to adhesives.  Past attempts at getting rhythm monitor tracings were always impeded by blistering rashes.  This also happened when she received a lidocaine patch after back surgery.  She  does not have known CAD.  She had a normal nuclear scan in 2009.  Past Medical History:  Diagnosis Date   Anemia    Arrhythmia 02/06/2012   hx of paroxysmal a fib with no recurrence,rare palpations   Arthritis    Breast lump    Cervical cancer (HCC)    Dysrhythmia    GERD (gastroesophageal reflux disease)    History of hiatal hernia    History of kidney stones    Hx of migraine headaches    Macular degeneration    L eye   SCC (squamous cell carcinoma) 2013   right temple tx cx3 14f   SCC (squamous cell carcinoma) 2014   right upper shin scc/ka tx cx3 533f& Exc   Squamous cell carcinoma of skin 1999   left neck scc tx cx3 [id number] Thyroid disease     Past Surgical History:  Procedure Laterality Date   ABDOMINAL HYSTERECTOMY     CHOLECYSTECTOMY     KYPHOPLASTY N/A 12/18/2018   Procedure: KYPHOPLASTY, THORACIC SEVEN;  Surgeon: Jill Mcdowell;  [facility]  Laterality: N/A;  KYPHOPLASTY, THORACIC 7   NM MYOCAR PERF WALL MOTION  02/19/2008   negative for ischemia   SUPRAVENTRICULAR TACHYCARDIA ABLATION     THYROIDECTOMY N/A 05/16/2021   Procedure: TOTAL THYROIDECTOMY;  Surgeon: Jill Mcdowell;  Location: [facility];  Service: General;  Laterality: N/A;    Current Medications: Outpatient Medications Prior to Visit  Medication Sig Dispense Refill   acetaminophen (TYLENOL) 500 MG  tablet Take 500 mg by mouth every 8 (eight) hours as needed for headache.      Brimonidine Tartrate (LUMIFY) 0.025 % SOLN Place 1 drop into both eyes daily.     calcium carbonate (TUMS) 500 MG chewable tablet Chew 3 tablets by mouth 3 (three) times daily.     Cholecalciferol (VITAMIN D-3 PO) Take 10,000 Units by mouth every Friday.      DILT-XR 180 MG 24 hr capsule TAKE 1 CAPSULE EVERY DAY 90 capsule 3   iron polysaccharides (NIFEREX) 150 MG capsule Take 150 mg by mouth every Monday, Wednesday, and Friday.     levothyroxine (SYNTHROID) 75 MCG tablet Take 75 mcg by mouth  daily before breakfast.     loratadine (CLARITIN) 10 MG tablet Take 10 mg by mouth daily as needed for allergies.     Multiple Vitamins-Minerals (PRESERVISION AREDS PO) Take 1 capsule by mouth 2 (two) times daily.      omeprazole (PRILOSEC) 20 MG capsule Take 20 mg by mouth daily with lunch.      polyethylene glycol powder (GLYCOLAX/MIRALAX) 17 GM/SCOOP powder Take 17 g by mouth daily.     Brimonidine Tartrate (LUMIFY) 0.025 % SOLN 1 drop into affected eye     calcitRIOL (ROCALTROL) 0.25 MCG capsule Take 1 capsule (0.25 mcg total) by mouth daily. (Patient not taking: Reported on 11/18/2021) 10 capsule 0   calcium carbonate (OS-CAL - DOSED IN MG OF ELEMENTAL CALCIUM) 1250 (500 Ca) MG tablet Take 2 tablets (1,000 mg of elemental calcium total) by mouth in the morning, at noon, in the evening, and at bedtime. (Patient not taking: Reported on 11/18/2021) 120 tablet 1   calcium carbonate (TUMS - DOSED IN MG ELEMENTAL CALCIUM) 500 MG chewable tablet Chew 500 mg by mouth daily. Take 3 tablets daily     calcium carbonate (TUMS) 500 MG chewable tablet Chew 2 tablets (400 mg of elemental calcium total) by mouth 3 (three) times daily. (Patient not taking: Reported on 11/18/2021) 90 tablet 1   calcium carbonate (TUMS) 500 MG chewable tablet Chew 2 tablets (400 mg of elemental calcium total) by mouth 3 (three) times daily. (Patient not taking: Reported on 11/18/2021) 90 tablet 1   levothyroxine (SYNTHROID, LEVOTHROID) 50 MCG tablet Take 75 mcg by mouth daily before breakfast. (Patient not taking: Reported on 11/18/2021)     Probiotic Product ([place]) CHEW Chew by mouth daily in the afternoon. (Patient not taking: Reported on 11/18/2021)     traMADol (ULTRAM) 50 MG tablet Take 1-2 tablets (50-100 mg total) by mouth every 6 (six) hours as needed for moderate pain. (Patient not taking: Reported on 11/18/2021) 15 tablet 0   No facility-administered medications prior to visit.     Allergies:   Nebivolol hcl,  Phenothiazines, Pneumococcal vac polyvalent, Sulfamethoxazole-trimethoprim, Latex, Metoprolol, and Sulfa antibiotics   Social History   Socioeconomic History   Marital status: Married    Spouse name: Not on file   Number of children: Not on file   Years of education: Not on file   Highest education level: Not on file  Occupational History   Not on file  Tobacco Use   Smoking status: Former    Types: Cigarettes   Smokeless tobacco: Never  Vaping Use   Vaping Use: Never used  Substance and Sexual Activity   Alcohol use: No    Alcohol/week: 0.0 standard drinks   Drug use: No   Sexual activity: Not on file  Other Topics Concern  Not on file  Social History Narrative   Not on file   Social Determinants of Health   Financial Resource Strain: Not on file  Food Insecurity: Not on file  Transportation Needs: Not on file  Physical Activity: Not on file  Stress: Not on file  Social Connections: Not on file     Family History:  The patient's family history includes Cancer in her brother and mother; Stroke in her father.   ROS:   Please see the history of present illness.    ROS All other systems are reviewed and are negative.  PHYSICAL EXAM:   VS:  BP 110/70 (BP Location: Left Arm, Patient Position: Sitting, Cuff Size: Normal)   Pulse 82   Ht 5' (1.524 m)   Wt 116 lb 12.8 oz (53 kg)   SpO2 91%   BMI 22.81 kg/m      General: Alert, oriented x3, no distress, very slender Head: no evidence of trauma, PERRL, EOMI, no exophtalmos or lid lag, no myxedema, no xanthelasma; normal ears, nose and oropharynx Neck: normal jugular venous pulsations and no hepatojugular reflux; brisk carotid pulses without delay and no carotid bruits Chest: clear to auscultation, no signs of consolidation by percussion or palpation, normal fremitus, symmetrical and full respiratory excursions Cardiovascular: normal position and quality of the apical impulse, regular rhythm, normal first and second  heart sounds, no murmurs, rubs or gallops Abdomen: no tenderness or distention, no masses by palpation, no abnormal pulsatility or arterial bruits, normal bowel sounds, no hepatosplenomegaly Extremities: no clubbing, cyanosis or edema; 2+ radial, ulnar and brachial pulses bilaterally; 2+ right femoral, posterior tibial and dorsalis pedis pulses; 2+ left femoral, posterior tibial and dorsalis pedis pulses; no subclavian or femoral bruits Neurological: grossly nonfocal Psych: Normal mood and affect   Wt Readings from Last 3 Encounters:  11/18/21 116 lb 12.8 oz (53 kg)  05/16/21 110 lb 7.2 oz (50.1 kg)  04/28/21 110 lb 9.6 oz (50.2 kg)      Studies/Labs Reviewed:   EKG:  EKG is ordered today.  It is a completely normal tracing.  There are no repolarization abnormalities.  QTc 450 ms.  Recent Labs: October 23, 2017 hemoglobin 14, normal liver function tests, TSH 0.76 Total cholesterol 206, HDL 57, LDL 124, triglycerides 123. 06/18/2019 Hemoglobin 14.1, normal liver function tests, TSH 1.14 Total cholesterol 177, HDL 61, LDL 107, triglycerides 102 05/27/2020 Cholesterol 198, HDL 65, LDL 117, triglycerides 91 Hemoglobin 15.4, creatinine 0.86, potassium 4.1, normal liver function tests, TSH 1.21 11/07/2021 Cholesterol 207, HDL 60, LDL 125, triglycerides 128 Creatinine 0.9, potassium 4.5, ALT 11, TSH 3.25  ASSESSMENT:    1. PVCs (premature ventricular contractions)   2. Paroxysmal atrial fibrillation (HCC)      PLAN:  In order of problems listed above:  PAFib: We have never had firm documentation of this.  Somebody put this diagnosis in her chart over 20 years ago but I was never able to find any ECG tracing that confirmed this.  It would be important to exclude atrial fibrillation in this 88 year old, but previous attempts at long-term arrhythmia recording have failed since she promptly develops a blistering rash in response to all the ECG of the symptoms.  She does not have a smart  phone or tablet.  Her granddaughter does have a smart watch with ECG capabilities.  I have asked her to use it when Jill Mcdowell has palpitations and send [place] a copy of the tracings. PVCs: On the limited event monitoring that  we have obtained in the past, her palpitations correlated with monomorphic PVCs.  She did not tolerate beta-blockers.  Diltiazem appeared to offer some benefit symptomatic benefit.  Her blood pressure is relatively low and she may not tolerate higher daily doses of diltiazem, but I gave her a prescription for "as needed" diltiazem to take for periods of increased palpitations.   Macular degeneration: In response to beta-blocker she developed Jill Mcdowell hallucinations related to macular degeneration;  retina specialist is Dr. Posey Pronto. Thoracic kyphosis/osteoporosis: Recovered well from recent back surgery.  Has history of wedge compression fracture.        Medication Adjustments/Labs and Tests Ordered: Current medicines are reviewed at length with the patient today.  Concerns regarding medicines are outlined above.  Medication changes, Labs and Tests ordered today are listed in the Patient Instructions below. Patient Instructions  Medication Instructions:     Diltiazem  30 mg  one tablet as needed  *If you need a refill on your cardiac medications before your next appointment, please call your pharmacy*   Lab Work: Not needed    Testing/Procedures: Not needed   Follow-Up: At [facility], you and your health needs are our priority.  As part of our continuing mission to provide you with exceptional heart care, we have created designated Provider Care Teams.  These Care Teams include your primary Cardiologist (physician) and Advanced Practice Providers (APPs -  Physician Assistants and Nurse Practitioners) who all work together to provide you with the care you need, when you need it.  We recommend signing up for the patient portal called "MyChart".  Sign up information  is provided on this After Visit Summary.  MyChart is used to connect with patients for Virtual Visits (Telemedicine).  Patients are able to view lab/test results, encounter notes, upcoming appointments, etc.  Non-urgent messages can be sent to your provider as well.   To learn more about what you can do with MyChart, go to [email or web address].    Your next appointment:   12 month(s)  The format for your next appointment:   In Person  Provider:   Sanda Klein, MD      Important Information About Sugar          Signed, Jill Klein, MD  11/18/2021 11:43 AM    [address], [place], [place]  [address] Phone: [phone]; Fax: [phone]

## 2021-11-18 NOTE — Patient Instructions (Signed)
Medication Instructions:     Diltiazem  30 mg  one tablet as needed  *If you need a refill on your cardiac medications before your next appointment, please call your pharmacy*   Lab Work: Not needed    Testing/Procedures: Not needed   Follow-Up: At [facility], you and your health needs are our priority.  As part of our continuing mission to provide you with exceptional heart care, we have created designated Provider Care Teams.  These Care Teams include your primary Cardiologist (physician) and Advanced Practice Providers (APPs -  Physician Assistants and Nurse Practitioners) who all work together to provide you with the care you need, when you need it.  We recommend signing up for the patient portal called "MyChart".  Sign up information is provided on this After Visit Summary.  MyChart is used to connect with patients for Virtual Visits (Telemedicine).  Patients are able to view lab/test results, encounter notes, upcoming appointments, etc.  Non-urgent messages can be sent to your provider as well.   To learn more about what you can do with MyChart, go to [email or web address].    Your next appointment:   12 month(s)  The format for your next appointment:   In Person  Provider:   Sanda Klein, MD      Important Information About Sugar

## 2021-11-22 DIAGNOSIS — Z0000 Encounter for general adult medical examination without abnormal findings: Secondary | ICD-10-CM | POA: Diagnosis not present

## 2021-12-09 ENCOUNTER — Ambulatory Visit (HOSPITAL_COMMUNITY): Payer: Medicare HMO | Attending: Cardiology

## 2021-12-09 DIAGNOSIS — R079 Chest pain, unspecified: Secondary | ICD-10-CM | POA: Diagnosis not present

## 2021-12-09 LAB — ECHOCARDIOGRAM COMPLETE
Area-P 1/2: 4.06 cm2
S' Lateral: 2.2 cm

## 2021-12-19 ENCOUNTER — Other Ambulatory Visit: Payer: Self-pay | Admitting: Cardiovascular Disease

## 2021-12-19 NOTE — Telephone Encounter (Signed)
Rx refill sent to pharmacy. 

## 2022-01-04 DIAGNOSIS — R300 Dysuria: Secondary | ICD-10-CM | POA: Diagnosis not present

## 2022-01-10 DIAGNOSIS — E890 Postprocedural hypothyroidism: Secondary | ICD-10-CM | POA: Diagnosis not present

## 2022-01-10 DIAGNOSIS — C73 Malignant neoplasm of thyroid gland: Secondary | ICD-10-CM | POA: Diagnosis not present

## 2022-01-25 ENCOUNTER — Ambulatory Visit: Payer: Medicare HMO | Admitting: Cardiovascular Disease

## 2022-02-01 DIAGNOSIS — R059 Cough, unspecified: Secondary | ICD-10-CM | POA: Diagnosis not present

## 2022-02-08 DIAGNOSIS — R531 Weakness: Secondary | ICD-10-CM | POA: Diagnosis not present

## 2022-02-08 DIAGNOSIS — R5383 Other fatigue: Secondary | ICD-10-CM | POA: Diagnosis not present

## 2022-02-08 DIAGNOSIS — Z87440 Personal history of urinary (tract) infections: Secondary | ICD-10-CM | POA: Diagnosis not present

## 2022-02-08 DIAGNOSIS — K449 Diaphragmatic hernia without obstruction or gangrene: Secondary | ICD-10-CM | POA: Diagnosis not present

## 2022-02-08 DIAGNOSIS — R051 Acute cough: Secondary | ICD-10-CM | POA: Diagnosis not present

## 2022-03-30 DIAGNOSIS — K219 Gastro-esophageal reflux disease without esophagitis: Secondary | ICD-10-CM | POA: Diagnosis not present

## 2022-03-30 DIAGNOSIS — E039 Hypothyroidism, unspecified: Secondary | ICD-10-CM | POA: Diagnosis not present

## 2022-03-30 DIAGNOSIS — Z23 Encounter for immunization: Secondary | ICD-10-CM | POA: Diagnosis not present

## 2022-03-30 DIAGNOSIS — M810 Age-related osteoporosis without current pathological fracture: Secondary | ICD-10-CM | POA: Diagnosis not present

## 2022-03-30 DIAGNOSIS — R053 Chronic cough: Secondary | ICD-10-CM | POA: Diagnosis not present

## 2022-03-30 DIAGNOSIS — I493 Ventricular premature depolarization: Secondary | ICD-10-CM | POA: Diagnosis not present

## 2022-03-30 DIAGNOSIS — D509 Iron deficiency anemia, unspecified: Secondary | ICD-10-CM | POA: Diagnosis not present

## 2022-03-30 DIAGNOSIS — Z85850 Personal history of malignant neoplasm of thyroid: Secondary | ICD-10-CM | POA: Diagnosis not present

## 2022-03-30 DIAGNOSIS — I480 Paroxysmal atrial fibrillation: Secondary | ICD-10-CM | POA: Diagnosis not present

## 2022-04-03 ENCOUNTER — Other Ambulatory Visit: Payer: Self-pay | Admitting: Internal Medicine

## 2022-04-03 DIAGNOSIS — D0512 Intraductal carcinoma in situ of left breast: Secondary | ICD-10-CM

## 2022-04-03 DIAGNOSIS — C73 Malignant neoplasm of thyroid gland: Secondary | ICD-10-CM

## 2022-04-19 DIAGNOSIS — Z1231 Encounter for screening mammogram for malignant neoplasm of breast: Secondary | ICD-10-CM | POA: Diagnosis not present

## 2022-04-19 DIAGNOSIS — M810 Age-related osteoporosis without current pathological fracture: Secondary | ICD-10-CM | POA: Diagnosis not present

## 2022-04-20 DIAGNOSIS — I480 Paroxysmal atrial fibrillation: Secondary | ICD-10-CM | POA: Diagnosis not present

## 2022-04-20 DIAGNOSIS — E890 Postprocedural hypothyroidism: Secondary | ICD-10-CM | POA: Diagnosis not present

## 2022-04-20 DIAGNOSIS — C73 Malignant neoplasm of thyroid gland: Secondary | ICD-10-CM | POA: Diagnosis not present

## 2022-04-27 DIAGNOSIS — R921 Mammographic calcification found on diagnostic imaging of breast: Secondary | ICD-10-CM | POA: Diagnosis not present

## 2022-04-27 DIAGNOSIS — N6002 Solitary cyst of left breast: Secondary | ICD-10-CM | POA: Diagnosis not present

## 2022-05-03 ENCOUNTER — Other Ambulatory Visit: Payer: Self-pay

## 2022-05-03 DIAGNOSIS — D0512 Intraductal carcinoma in situ of left breast: Secondary | ICD-10-CM | POA: Diagnosis not present

## 2022-05-08 ENCOUNTER — Encounter: Payer: Self-pay | Admitting: *Deleted

## 2022-05-08 ENCOUNTER — Encounter: Payer: Self-pay | Admitting: Family Medicine

## 2022-05-08 DIAGNOSIS — D0512 Intraductal carcinoma in situ of left breast: Secondary | ICD-10-CM | POA: Insufficient documentation

## 2022-05-09 ENCOUNTER — Other Ambulatory Visit: Payer: Self-pay

## 2022-05-09 DIAGNOSIS — D0512 Intraductal carcinoma in situ of left breast: Secondary | ICD-10-CM

## 2022-05-09 DIAGNOSIS — N6325 Unspecified lump in the left breast, overlapping quadrants: Secondary | ICD-10-CM | POA: Diagnosis not present

## 2022-05-10 ENCOUNTER — Ambulatory Visit
Admission: RE | Admit: 2022-05-10 | Discharge: 2022-05-10 | Disposition: A | Discharge status: Home / Self Care | Payer: Medicare HMO | Source: Ambulatory Visit | Attending: Radiation Oncology | Admitting: Radiation Oncology

## 2022-05-10 ENCOUNTER — Inpatient Hospital Stay: Payer: Medicare HMO | Attending: Hematology | Admitting: Hematology

## 2022-05-10 ENCOUNTER — Inpatient Hospital Stay (HOSPITAL_BASED_OUTPATIENT_CLINIC_OR_DEPARTMENT_OTHER): Payer: Medicare HMO | Admitting: Genetic Counselor

## 2022-05-10 ENCOUNTER — Ambulatory Visit: Payer: Self-pay | Admitting: Surgery

## 2022-05-10 ENCOUNTER — Encounter: Payer: Self-pay | Admitting: Hematology

## 2022-05-10 ENCOUNTER — Encounter: Payer: Self-pay | Admitting: *Deleted

## 2022-05-10 ENCOUNTER — Encounter: Payer: Self-pay | Admitting: Genetic Counselor

## 2022-05-10 ENCOUNTER — Other Ambulatory Visit: Payer: Self-pay

## 2022-05-10 ENCOUNTER — Ambulatory Visit: Payer: Medicare HMO | Admitting: Physical Therapy

## 2022-05-10 ENCOUNTER — Inpatient Hospital Stay: Payer: Medicare HMO

## 2022-05-10 VITALS — BP 107/64 | HR 96 | Temp 97.2°F | Resp 18 | Ht 60.0 in | Wt 117.3 lb

## 2022-05-10 DIAGNOSIS — D0512 Intraductal carcinoma in situ of left breast: Secondary | ICD-10-CM

## 2022-05-10 DIAGNOSIS — Z170 Estrogen receptor positive status [ER+]: Secondary | ICD-10-CM | POA: Diagnosis not present

## 2022-05-10 DIAGNOSIS — Z800 Family history of malignant neoplasm of digestive organs: Secondary | ICD-10-CM | POA: Diagnosis not present

## 2022-05-10 DIAGNOSIS — Z8041 Family history of malignant neoplasm of ovary: Secondary | ICD-10-CM | POA: Diagnosis not present

## 2022-05-10 DIAGNOSIS — Z853 Personal history of malignant neoplasm of breast: Secondary | ICD-10-CM | POA: Diagnosis not present

## 2022-05-10 LAB — CBC WITH DIFFERENTIAL (CANCER CENTER ONLY)
Abs Immature Granulocytes: 0.01 10*3/uL (ref 0.00–0.07)
Basophils Absolute: 0 10*3/uL (ref 0.0–0.1)
Basophils Relative: 1 %
Eosinophils Absolute: 0.1 10*3/uL (ref 0.0–0.5)
Eosinophils Relative: 2 %
HCT: 43.7 % (ref 36.0–46.0)
Hemoglobin: 14.5 g/dL (ref 12.0–15.0)
Immature Granulocytes: 0 %
Lymphocytes Relative: 23 %
Lymphs Abs: 1.5 10*3/uL (ref 0.7–4.0)
MCH: 30.7 pg (ref 26.0–34.0)
MCHC: 33.2 g/dL (ref 30.0–36.0)
MCV: 92.4 fL (ref 80.0–100.0)
Monocytes Absolute: 0.4 10*3/uL (ref 0.1–1.0)
Monocytes Relative: 7 %
Neutro Abs: 4.5 10*3/uL (ref 1.7–7.7)
Neutrophils Relative %: 67 %
Platelet Count: 167 10*3/uL (ref 150–400)
RBC: 4.73 MIL/uL (ref 3.87–5.11)
RDW: 13 % (ref 11.5–15.5)
WBC Count: 6.6 10*3/uL (ref 4.0–10.5)
nRBC: 0 % (ref 0.0–0.2)

## 2022-05-10 LAB — CMP (CANCER CENTER ONLY)
ALT: 9 U/L (ref 0–44)
AST: 16 U/L (ref 15–41)
Albumin: 4.2 g/dL (ref 3.5–5.0)
Alkaline Phosphatase: 66 U/L (ref 38–126)
Anion gap: 6 (ref 5–15)
BUN: 13 mg/dL (ref 8–23)
CO2: 29 mmol/L (ref 22–32)
Calcium: 9.1 mg/dL (ref 8.9–10.3)
Chloride: 105 mmol/L (ref 98–111)
Creatinine: 0.95 mg/dL (ref 0.44–1.00)
GFR, Estimated: 56 mL/min — ABNORMAL LOW (ref >60)
Glucose, Bld: 138 mg/dL — ABNORMAL HIGH (ref 70–99)
Potassium: 3.7 mmol/L (ref 3.5–5.1)
Sodium: 140 mmol/L (ref 135–145)
Total Bilirubin: 1.4 mg/dL — ABNORMAL HIGH (ref 0.3–1.2)
Total Protein: 6.7 g/dL (ref 6.5–8.1)

## 2022-05-10 LAB — GENETIC SCREENING ORDER

## 2022-05-10 NOTE — Progress Notes (Signed)
[place]   [phone] Fax:([phone]) [place] Note   Patient Care Team: Rankins, Bill Salinas, MD as PCP - General (Family Medicine) Croitoru, Dani Gobble, MD as PCP - Cardiology (Cardiology) Lavonna Monarch, MD (Inactive) as Consulting Physician (Dermatology) Mauro Kaufmann, RN as Oncology Nurse Navigator Rockwell Germany, RN as Oncology Nurse Navigator Truitt Merle, MD as Consulting Physician (Hematology) Kyung Rudd, MD as Consulting Physician (Radiation Oncology) Erroll Luna, MD as Consulting Physician (General Surgery)  Date of Service:  05/10/2022   CHIEF COMPLAINTS/PURPOSE OF CONSULTATION:  Left Breast DCIS, ER weakly +  REFERRING PHYSICIAN:  [facility]   ASSESSMENT & PLAN:  Jill Mcdowell is a 88 y.o. post-hysterectomy female with a history of   1. 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 Left diagnostic MM and [place] 04/27/22 showed calcifications in UOQ. Biopsy on 05/03/22 confirmed DCIS. -I discussed her breast imaging and needle biopsy results with patient and her family members in great detail. -She is a candidate for breast conservation surgery. She has been seen by breast surgeon Dr. Brantley Stage, who recommends lumpectomy. -Her DCIS will be cured by complete surgical resection. Any form of adjuvant therapy is preventive. -Given her weakly positive ER and negative PR, and her advanced age, I do not recommend antiestrogen therapy.  -She may benefit from breast radiation if she undergo lumpectomy to decrease the risk of breast cancer. She will discuss this further with Dr. Lisbeth Renshaw today.  -We also discussed that biopsy may have sampling limitation, we will review her surgical path, to see if she has any invasive carcinoma components. -We also discussed breast cancer surveillance.  If she continues living independently, I recommend her to continue annual breast mammogram.   PLAN:  -proceed with lumpectomy -I  will see her as needed.    Oncology History  Ductal carcinoma in situ (DCIS) of left breast  05/03/2022 Cancer Staging   Staging form: Breast, AJCC 8th Edition - Clinical stage from 05/03/2022: Stage 0 (cTis (DCIS), cN0, cM0, G3, ER+, PR-, HER2: Not Assessed) - Signed by Truitt Merle, MD on 05/09/2022 Stage prefix: Initial diagnosis Histologic grading system: 3 grade system   05/03/2022 Initial Biopsy   Diagnosis Breast, left, needle core biopsy, UOQ - DUCTAL CARCINOMA IN SITU (DCIS), GRADE 2-3 (INTERMEDIATE-HIGH). - MICROCALCIFICATIONS PRESENT.  PROGNOSTIC INDICATORS Results: IMMUNOHISTOCHEMICAL AND MORPHOMETRIC ANALYSIS PERFORMED MANUALLY Estrogen Receptor: 10%, POSITIVE, WEAK STAINING INTENSITY Progesterone Receptor: 0%, NEGATIVE COMMENT: The negative hormone receptor study(ies) in this case has an internal positive control. The cancer in this sample has a low level (1%-10%) of ER expression by IHC. There are limited data on the overall benefit of endocrine therapies for patients with low level (1%-10%) ER expression, but they currently suggest possible benefit, so patients are considered eligible for endocrine treatment. There are data that suggest invasive cancers with these results are heterogeneous in both behavior and biology and often have gene expression profiles more similar to ER-negative cancers.   05/08/2022 Initial Diagnosis   Ductal carcinoma in situ (DCIS) of left breast      HISTORY OF PRESENTING ILLNESS:  Jill Mcdowell 88 y.o. female is a here because of breast cancer. The patient was referred by [facility]. The patient presents to the clinic today accompanied by her daughter, son, and niece.   She had routine screening mammography on 04/19/22 showing a possible abnormality in the left breast. She underwent left diagnostic mammography and left breast ultrasonography on 04/27/22  showing: regional calcifications in upper outer left breast.  Biopsy on 05/03/22 showed: DCIS  with microcalcifications. Prognostic indicators significant for: estrogen receptor, 10% weakly positive and progesterone receptor, 0% negative.   She has a PMHx of.... -dysrhythmia  -cervical cancer, 1980, s/p hysterectomy -s/p cholecystectomy, 1994 -s/p thyroidectomy, 04/2021 -GERD -s/p kyphoplasty, 11/2018  Socially... -she is retired and widowed -she has two children, who both accompanied her today -she reports pancreatic cancer in her mother, colon cancer in a brother, and breast cancer in her niece (who accompanied her today) -former smoker, quit in 1990, ~0.5 ppd ("3 packs per week") for 40+ years   GYN HISTORY  Menarchal: 88 years old LMP: xx Contraceptive: none HRT: took for ~4-5 years GP: 2, first at age 69    REVIEW OF SYSTEMS:    Constitutional: Denies fevers, chills or abnormal night sweats Eyes: Denies blurriness of vision, double vision or watery eyes Ears, nose, mouth, throat, and face: Denies mucositis or sore throat Respiratory: Denies cough, dyspnea or wheezes Cardiovascular: Denies palpitation, chest discomfort or lower extremity swelling Gastrointestinal:  Denies nausea, heartburn or change in bowel habits Skin: Denies abnormal skin rashes Lymphatics: Denies new lymphadenopathy or easy bruising Neurological:Denies numbness, tingling or new weaknesses Behavioral/Psych: Mood is stable, no new changes  All other systems were reviewed with the patient and are negative.   MEDICAL HISTORY:  Past Medical History:  Diagnosis Date   Anemia    Arrhythmia 02/06/2012   hx of paroxysmal a fib with no recurrence,rare palpations   Arthritis    Breast lump    Cervical cancer (HCC)    Dysrhythmia    GERD (gastroesophageal reflux disease)    History of hiatal hernia    History of kidney stones    Hx of migraine headaches    Macular degeneration    L eye   SCC (squamous cell carcinoma) 2013   right temple tx cx3 61f   SCC (squamous cell carcinoma) 2014    right upper shin scc/ka tx cx3 5495f& Exc   Squamous cell carcinoma of skin 1999   left neck scc tx cx3 [id number] Thyroid disease     SURGICAL HISTORY: Past Surgical History:  Procedure Laterality Date   ABDOMINAL HYSTERECTOMY     CHOLECYSTECTOMY     KYPHOPLASTY N/A 12/18/2018   Procedure: KYPHOPLASTY, THORACIC SEVEN;  Surgeon: JenNewman PiesD;  [facility]  Laterality: N/A;  KYPHOPLASTY, THORACIC 7   NM MYOCAR PERF WALL MOTION  02/19/2008   negative for ischemia   SUPRAVENTRICULAR TACHYCARDIA ABLATION     THYROIDECTOMY N/A 05/16/2021   Procedure: TOTAL THYROIDECTOMY;  Surgeon: GerArmandina GemmaD;  [facility];  Service: General;  Laterality: N/A;    SOCIAL HISTORY: Social History   Socioeconomic History   Marital status: Married    Spouse name: Not on file   Number of children: 2   Years of education: Not on file   Highest education level: Not on file  Occupational History   Not on file  Tobacco Use   Smoking status: Former    Packs/day: 0.50    Years: 40.00    Total pack years: 20.00    Types: Cigarettes    Quit date: 19990 Years since quitting: 33.8   Smokeless tobacco: Never  Vaping Use   Vaping Use: Never used  Substance and Sexual Activity   Alcohol use: No    Alcohol/week: 0.0 standard drinks of alcohol  Drug use: No   Sexual activity: Not on file  Other Topics Concern   Not on file  Social History Narrative   Not on file   Social Determinants of Health   Financial Resource Strain: Not on file  Food Insecurity: Not on file  Transportation Needs: Not on file  Physical Activity: Not on file  Stress: Not on file  Social Connections: Not on file  Intimate Partner Violence: Not on file    FAMILY HISTORY: Family History  Problem Relation Age of Onset   Pancreatic cancer Mother    Stroke Father    Ovarian cancer Sister    Colon cancer Brother 38   Lung cancer Brother     ALLERGIES:  is allergic to nebivolol  hcl, phenothiazines, pneumococcal vac polyvalent, sulfamethoxazole-trimethoprim, latex, metoprolol, and sulfa antibiotics.  MEDICATIONS:  Current Outpatient Medications  Medication Sig Dispense Refill   acetaminophen (TYLENOL) 500 MG tablet Take 500 mg by mouth every 8 (eight) hours as needed for headache.      Brimonidine Tartrate (LUMIFY) 0.025 % SOLN Place 1 drop into both eyes daily.     calcitRIOL (ROCALTROL) 0.25 MCG capsule Take 1 capsule (0.25 mcg total) by mouth daily. (Patient not taking: Reported on 11/18/2021) 10 capsule 0   calcium carbonate (TUMS) 500 MG chewable tablet Chew 3 tablets by mouth 3 (three) times daily.     Cholecalciferol (VITAMIN D-3 PO) Take 10,000 Units by mouth every Friday.      diltiazem (CARDIZEM) 30 MG tablet Take 1 tablet (30 mg total) by mouth daily as needed. 30 tablet 3   diltiazem (DILACOR XR) 180 MG 24 hr capsule TAKE 1 CAPSULE EVERY DAY 90 capsule 3   iron polysaccharides (NIFEREX) 150 MG capsule Take 150 mg by mouth every Monday, Wednesday, and Friday.     levothyroxine (SYNTHROID) 75 MCG tablet Take 75 mcg by mouth daily before breakfast.     loratadine (CLARITIN) 10 MG tablet Take 10 mg by mouth daily as needed for allergies.     Multiple Vitamins-Minerals (PRESERVISION AREDS PO) Take 1 capsule by mouth 2 (two) times daily.      omeprazole (PRILOSEC) 20 MG capsule Take 20 mg by mouth daily with lunch.      polyethylene glycol powder (GLYCOLAX/MIRALAX) 17 GM/SCOOP powder Take 17 g by mouth daily.     Probiotic Product (ALIGN JR FOR KIDS) CHEW Chew by mouth daily in the afternoon. (Patient not taking: Reported on 11/18/2021)     No current facility-administered medications for this visit.    PHYSICAL EXAMINATION: ECOG PERFORMANCE STATUS: 0 - Asymptomatic  Vitals:   05/10/22 1245  BP: 107/64  Pulse: 96  Resp: 18  Temp: (!) 97.2 F (36.2 C)  SpO2: 97%   Filed Weights   05/10/22 1245  Weight: 117 lb 4.8 oz (53.2 kg)    GENERAL:alert, no  distress and comfortable SKIN: skin color, texture, turgor are normal, no rashes or significant lesions EYES: normal, Conjunctiva are pink and non-injected, sclera clear  NECK: supple, thyroid normal size, non-tender, without nodularity LYMPH:  no palpable lymphadenopathy in the cervical, axillary  LUNGS: clear to auscultation and percussion with normal breathing effort HEART: regular rate & rhythm and no murmurs and no lower extremity edema ABDOMEN:abdomen soft, non-tender and normal bowel sounds Musculoskeletal:no cyanosis of digits and no clubbing  NEURO: alert & oriented x 3 with fluent speech, no focal motor/sensory deficits BREAST: 2 cm post-biopsy mass in UOQ left breast. No palpable nodules or  adenopathy bilaterally. Breast exam benign.  LABORATORY DATA:  I have reviewed the data as listed    Latest Ref Rng & Units 05/10/2022   12:25 PM 04/28/2021    8:27 AM 12/17/2018    7:15 AM  CBC  WBC 4.0 - 10.5 K/uL 6.6  6.4  10.3   Hemoglobin 12.0 - 15.0 g/dL 14.5  14.7  14.0   Hematocrit 36.0 - 46.0 % 43.7  45.0  43.0   Platelets 150 - 400 K/uL 167  181  194        Latest Ref Rng & Units 05/10/2022   12:25 PM 05/17/2021    4:05 AM 04/28/2021    8:27 AM  CMP  Glucose 70 - 99 mg/dL 138  104  120   BUN 8 - 23 mg/dL _0 Creatinine 0.44 - 1.00 mg/dL 0.95  0.64  0.78   Sodium 135 - 145 mmol/L 140  136  141   Potassium 3.5 - 5.1 mmol/L 3.7  4.0  3.9   Chloride 98 - 111 mmol/L 105  105  105   CO2 22 - 32 mmol/L _1 Calcium 8.9 - 10.3 mg/dL 9.1  8.0  9.3   Total Protein 6.5 - 8.1 g/dL 6.7     Total Bilirubin 0.3 - 1.2 mg/dL 1.4     Alkaline Phos 38 - 126 U/L 66     AST 15 - 41 U/L 16     ALT 0 - 44 U/L 9        RADIOGRAPHIC STUDIES: I have personally reviewed the radiological images as listed and agreed with the findings in the report. No results found.   No orders of the defined types were placed in this encounter.   All questions were answered. The  patient knows to call the clinic with any problems, questions or concerns. The total time spent in the appointment was 30 minutes.     Truitt Merle, MD 05/10/2022 5:35 PM  I, Wilburn Mylar, am acting as scribe for Truitt Merle, MD.   I have reviewed the above documentation for accuracy and completeness, and I agree with the above.

## 2022-05-10 NOTE — Progress Notes (Signed)
REFERRING PROVIDER: Truitt Merle, MD  PRIMARY PROVIDER:  Aretta Nip, MD  PRIMARY REASON FOR VISIT:  1. Ductal carcinoma in situ (DCIS) of left breast    HISTORY OF PRESENT ILLNESS:   Jill Mcdowell, a 88 y.o. female, was seen for a [facility] cancer genetics consultation at the request of Dr. Burr Medico due to a personal and family history of cancer.  Jill Mcdowell presents to clinic today to discuss the possibility of a hereditary predisposition to cancer, to discuss genetic testing, and to further clarify her future cancer risks, as well as potential cancer risks for family members.   In November 2023, at the age of 77, Jill Mcdowell was diagnosed with ductal carcinoma in situ of the left breast (ER 10% positive, PR negative). She also has a personal history of cervical cancer.   CANCER HISTORY:  Oncology History  Ductal carcinoma in situ (DCIS) of left breast  05/03/2022 Cancer Staging   Staging form: Breast, AJCC 8th Edition - Clinical stage from 05/03/2022: Stage 0 (cTis (DCIS), cN0, cM0, G3, ER+, PR-, HER2: Not Assessed) - Signed by Truitt Merle, MD on 05/09/2022 Stage prefix: Initial diagnosis Histologic grading system: 3 grade system   05/03/2022 Initial Biopsy   Diagnosis Breast, left, needle core biopsy, UOQ - DUCTAL CARCINOMA IN SITU (DCIS), GRADE 2-3 (INTERMEDIATE-HIGH). - MICROCALCIFICATIONS PRESENT.  PROGNOSTIC INDICATORS Results: IMMUNOHISTOCHEMICAL AND MORPHOMETRIC ANALYSIS PERFORMED MANUALLY Estrogen Receptor: 10%, POSITIVE, WEAK STAINING INTENSITY Progesterone Receptor: 0%, NEGATIVE COMMENT: The negative hormone receptor study(ies) in this case has an internal positive control. The cancer in this sample has a low level (1%-10%) of ER expression by IHC. There are limited data on the overall benefit of endocrine therapies for patients with low level (1%-10%) ER expression, but they currently suggest possible benefit, so patients are considered eligible for endocrine treatment.  There are data that suggest invasive cancers with these results are heterogeneous in both behavior and biology and often have gene expression profiles more similar to ER-negative cancers.   05/08/2022 Initial Diagnosis   Ductal carcinoma in situ (DCIS) of left breast      RISK FACTORS:  Menarche was at age 42.  First live birth at age 39.  OCP use for approximately 0 years.  Uterus intact: no.  Menopausal status: postmenopausal.  HRT use: 0 years. Colonoscopy: yes;  normal . Mammogram within the last year: yes. Any excessive radiation exposure in the past: no  Past Medical History:  Diagnosis Date   Anemia    Arrhythmia 02/06/2012   hx of paroxysmal a fib with no recurrence,rare palpations   Arthritis    Breast lump    Cervical cancer (HCC)    Dysrhythmia    GERD (gastroesophageal reflux disease)    History of hiatal hernia    History of kidney stones    Hx of migraine headaches    Macular degeneration    L eye   SCC (squamous cell carcinoma) 2013   right temple tx cx3 69f   SCC (squamous cell carcinoma) 2014   right upper shin scc/ka tx cx3 528f& Exc   Squamous cell carcinoma of skin 1999   left neck scc tx cx3 [id number] Thyroid disease     Past Surgical History:  Procedure Laterality Date   ABDOMINAL HYSTERECTOMY     CHOLECYSTECTOMY     KYPHOPLASTY N/A 12/18/2018   Procedure: KYPHOPLASTY, THORACIC SEVEN;  Surgeon: JenNewman PiesD;  [facility]  Laterality: N/A;  KYPHOPLASTY, THORACIC 7   NM MYOCAR PERF WALL MOTION  02/19/2008   negative for ischemia   SUPRAVENTRICULAR TACHYCARDIA ABLATION     THYROIDECTOMY N/A 05/16/2021   Procedure: TOTAL THYROIDECTOMY;  Surgeon: Armandina Gemma, MD;  [facility];  Service: General;  Laterality: N/A;    Social History   Socioeconomic History   Marital status: Married    Spouse name: Not on file   Number of children: 2   Years of education: Not on file   Highest education level: Not on  file  Occupational History   Not on file  Tobacco Use   Smoking status: Former    Packs/day: 0.50    Years: 40.00    Total pack years: 20.00    Types: Cigarettes    Quit date: 34    Years since quitting: 33.8   Smokeless tobacco: Never  Vaping Use   Vaping Use: Never used  Substance and Sexual Activity   Alcohol use: No    Alcohol/week: 0.0 standard drinks of alcohol   Drug use: No   Sexual activity: Not on file  Other Topics Concern   Not on file  Social History Narrative   Not on file   Social Determinants of Health   Financial Resource Strain: Not on file  Food Insecurity: Not on file  Transportation Needs: Not on file  Physical Activity: Not on file  Stress: Not on file  Social Connections: Not on file     FAMILY HISTORY:  We obtained a detailed, 4-generation family history.  Significant diagnoses are listed below: Family History  Problem Relation Age of Onset   Pancreatic cancer Mother    Stroke Father    Ovarian cancer Sister    Colon cancer Brother 32   Lung cancer Brother       Jill Mcdowell has 3 brothers. One brother was diagnosed with colon cancer at age 69 and a second brother was diagnosed with lung and brain cancer at an unknown age, both brothers are deceased. She has 3 sisters and her family reports that one sister had a history of ovarian cancer. Additionally, Ms. Follansbee family members report her mother likely had pancreatic cancer, she is deceased. Jill Mcdowell is unaware of previous family history of genetic testing for hereditary cancer risks. There is no reported Ashkenazi Jewish ancestry.   GENETIC COUNSELING ASSESSMENT: Jill Mcdowell is a 88 y.o. female with a personal and family history of cancer which is somewhat suggestive of a hereditary predisposition to cancer. We, therefore, discussed and recommended the following at today's visit.   DISCUSSION: We discussed that 5 - 10% of cancer is hereditary, with most cases of breast cancer associated  with BRCA1/2.  There are other genes that can be associated with hereditary breast cancer syndromes.  We discussed that testing is beneficial for several reasons including knowing how to follow individuals after completing their treatment, identifying whether potential treatment options would be beneficial, and understanding if other family members could be at risk for cancer and allowing them to undergo genetic testing.   We reviewed the characteristics, features and inheritance patterns of hereditary cancer syndromes. We also discussed genetic testing, including the appropriate family members to test, the process of testing, insurance coverage and turn-around-time for results. We discussed the implications of a negative, positive, carrier and/or variant of uncertain significant result. We recommended Jill Mcdowell pursue genetic testing for a panel that includes genes associated with breast, ovarian, pancreatic, and colon cancer.   Jill Mcdowell elected to have [employer]. The CancerNext gene panel offered by [employer] includes sequencing, rearrangement analysis, and RNA analysis for the following 36 genes:   APC, ATM, AXIN2, BARD1, BMPR1A, BRCA1, BRCA2, BRIP1, CDH1, CDK4, CDKN2A, CHEK2, DICER1, HOXB13, EPCAM, GREM1, MLH1, MSH2, MSH3, MSH6, MUTYH, NBN, NF1, NTHL1, PALB2, PMS2, POLD1, POLE, PTEN, RAD51C, RAD51D, RECQL, SMAD4, SMARCA4, STK11, and TP53.   Based on Jill Mcdowell's personal and family history of cancer, she meets medical criteria for genetic testing. Despite that she meets criteria, she may still have an out of pocket cost. We discussed that if her out of pocket cost for testing is over $100, the laboratory will call and confirm whether she wants to proceed with testing.  If the out of pocket cost of testing is less than $100 she will be billed by the genetic testing laboratory.   PLAN: After considering the risks, benefits, and limitations, Jill Mcdowell provided informed consent to  pursue genetic testing and the blood sample was sent to [facility] for analysis of the CancerNext Panel. Results should be available within approximately 2-3 weeks' time, at which point they will be disclosed by telephone to Jill Mcdowell, as will any additional recommendations warranted by these results. Jill Mcdowell will receive a summary of her genetic counseling visit and a copy of her results once available. This information will also be available in Epic.   Jill Mcdowell questions were answered to her satisfaction today. Our contact information was provided should additional questions or concerns arise. Thank you for the referral and allowing [place] to share in the care of your patient.   Lucille Passy, MS, [facility] Genetic Counselor [email or web address] (P) [phone]  The patient was seen for a total of 20 minutes in face-to-face genetic counseling.  The patient brought her children and niece. Drs. Lindi Adie and/or Burr Medico were available to discuss this case as needed.   _______________________________________________________________________ For Office Staff:  Number of people involved in session: 4 Was an Intern/ student involved with case: no

## 2022-05-10 NOTE — Progress Notes (Signed)
[facility] Psychosocial Distress Screening Spiritual Care  Met with Io, her son, daughter, and her niece in [facility] to introduce [facility] team/resources, reviewing distress screen per protocol.  The patient scored a 1 on the Psychosocial Distress Thermometer which indicates mild distress. Also assessed for distress and other psychosocial needs.      05/10/2022    3:27 PM  ONCBCN DISTRESS SCREENING  Distress experienced in past week (1-10) 0  Physical Problem type Constipation/diarrhea  Referral to support programs Yes   Lysle Morales, counseling intern, met with patient and her family in clinic today. The patient reported she feels happy because she learned she was Stage 0. The patient reported she has a good support system with her family and her friends. The counselor saw the support in the room.   The patient has the counselor's contact information in case needs arise.   Follow up needed: No.   Lysle Morales,  Counseling Intern  [phone] [email or web address]

## 2022-05-10 NOTE — Progress Notes (Signed)
Radiation Oncology         [phone] ________________________________  Name: Jill Mcdowell        MRN: [id number]  Date of Service: 05/10/2022 DOB: October 19, 1926  [id number], Bill Salinas, MD  Erroll Luna, MD     REFERRING PHYSICIAN: Erroll Luna, MD   DIAGNOSIS: The encounter diagnosis was Ductal carcinoma in situ (DCIS) of left breast.   HISTORY OF PRESENT ILLNESS: Jill Mcdowell is a 88 y.o. female seen in the multidisciplinary breast clinic for a new diagnosis of  breast cancer. The patient was noted to have screening detected architectural distortion and calcifications in the upper outer quadrant. These measured 3.5 cm, and another 6 mm mass in the breast at 3:00 was consistent with a benign cyst that was aspirated. The calcifications were biopsied and showed an intermediate grade DCIS that was ER weak, PR negative. She's seen today to discuss treatment recommendations.    PREVIOUS RADIATION THERAPY: No   PAST MEDICAL HISTORY:  Past Medical History:  Diagnosis Date   Anemia    Arrhythmia 02/06/2012   hx of paroxysmal a fib with no recurrence,rare palpations   Arthritis    Breast lump    Cervical cancer (HCC)    Dysrhythmia    GERD (gastroesophageal reflux disease)    History of hiatal hernia    History of kidney stones    Hx of migraine headaches    Macular degeneration    L eye   SCC (squamous cell carcinoma) 2013   right temple tx cx3 49f   SCC (squamous cell carcinoma) 2014   right upper shin scc/ka tx cx3 571f& Exc   Squamous cell carcinoma of skin 1999   left neck scc tx cx3 [id number] Thyroid disease        PAST SURGICAL HISTORY: Past Surgical History:  Procedure Laterality Date   ABDOMINAL HYSTERECTOMY     CHOLECYSTECTOMY     KYPHOPLASTY N/A 12/18/2018   Procedure: KYPHOPLASTY, THORACIC SEVEN;  Surgeon: JenNewman PiesD;  [facility];  Laterality: N/A;  KYPHOPLASTY, THORACIC 7   NM MYOCAR PERF WALL MOTION  02/19/2008    negative for ischemia   SUPRAVENTRICULAR TACHYCARDIA ABLATION     THYROIDECTOMY N/A 05/16/2021   Procedure: TOTAL THYROIDECTOMY;  Surgeon: GerArmandina GemmaD;  Location: [facility];  Service: General;  Laterality: N/A;     FAMILY HISTORY:  Family History  Problem Relation Age of Onset   Cancer Mother        pancreatic cancer   Stroke Father    Cancer Brother        colon   Cancer Niece        breast     SOCIAL HISTORY:  reports that she quit smoking about 33 years ago. Her smoking use included cigarettes. She has a 20.00 pack-year smoking history. She has never used smokeless tobacco. She reports that she does not drink alcohol and does not use drugs.   ALLERGIES: Nebivolol hcl, Phenothiazines, Pneumococcal vac polyvalent, Sulfamethoxazole-trimethoprim, Latex, Metoprolol, and Sulfa antibiotics   MEDICATIONS:  Current Outpatient Medications  Medication Sig Dispense Refill   acetaminophen (TYLENOL) 500 MG tablet Take 500 mg by mouth every 8 (eight) hours as needed for headache.      Brimonidine Tartrate (LUMIFY) 0.025 % SOLN Place 1 drop into both eyes daily.     calcitRIOL (ROCALTROL) 0.25 MCG capsule Take 1 capsule (0.25 mcg total) by mouth daily. (Patient not taking: Reported  on 11/18/2021) 10 capsule 0   calcium carbonate (TUMS) 500 MG chewable tablet Chew 3 tablets by mouth 3 (three) times daily.     Cholecalciferol (VITAMIN D-3 PO) Take 10,000 Units by mouth every Friday.      diltiazem (CARDIZEM) 30 MG tablet Take 1 tablet (30 mg total) by mouth daily as needed. 30 tablet 3   diltiazem (DILACOR XR) 180 MG 24 hr capsule TAKE 1 CAPSULE EVERY DAY 90 capsule 3   iron polysaccharides (NIFEREX) 150 MG capsule Take 150 mg by mouth every Monday, Wednesday, and Friday.     levothyroxine (SYNTHROID) 75 MCG tablet Take 75 mcg by mouth daily before breakfast.     loratadine (CLARITIN) 10 MG tablet Take 10 mg by mouth daily as needed for allergies.     Multiple Vitamins-Minerals  (PRESERVISION AREDS PO) Take 1 capsule by mouth 2 (two) times daily.      omeprazole (PRILOSEC) 20 MG capsule Take 20 mg by mouth daily with lunch.      polyethylene glycol powder (GLYCOLAX/MIRALAX) 17 GM/SCOOP powder Take 17 g by mouth daily.     Probiotic Product (ALIGN JR FOR KIDS) CHEW Chew by mouth daily in the afternoon. (Patient not taking: Reported on 11/18/2021)     No current facility-administered medications for this encounter.     REVIEW OF SYSTEMS: On review of systems, the patient reports that she is doing well overall. She is hoping to avoid radiation. No other complaints are noted.      PHYSICAL EXAM:  Wt Readings from Last 3 Encounters:  05/10/22 117 lb 4.8 oz (53.2 kg)  11/18/21 116 lb 12.8 oz (53 kg)  05/16/21 110 lb 7.2 oz (50.1 kg)   Temp Readings from Last 3 Encounters:  05/10/22 (!) 97.2 F (36.2 C) (Temporal)  05/17/21 98.7 F (37.1 C) (Oral)  04/28/21 98.3 F (36.8 C) (Oral)   BP Readings from Last 3 Encounters:  05/10/22 107/64  11/18/21 110/70  05/17/21 120/67   Pulse Readings from Last 3 Encounters:  05/10/22 96  11/18/21 82  05/17/21 81    In general this is a well appearing caucasian female in no acute distress. She's alert and oriented x4 and appropriate throughout the examination. Cardiopulmonary assessment is negative for acute distress and she exhibits normal effort. Bilateral breast exam is deferred.    ECOG = 0  0 - Asymptomatic (Fully active, able to carry on all predisease activities without restriction)  1 - Symptomatic but completely ambulatory (Restricted in physically strenuous activity but ambulatory and able to carry out work of a light or sedentary nature. For example, light housework, office work)  2 - Symptomatic, <50% in bed during the day (Ambulatory and capable of all self care but unable to carry out any work activities. Up and about more than 50% of waking hours)  3 - Symptomatic, >50% in bed, but not bedbound  (Capable of only limited self-care, confined to bed or chair 50% or more of waking hours)  4 - Bedbound (Completely disabled. Cannot carry on any self-care. Totally confined to bed or chair)  5 - Death   Eustace Pen MM, Creech RH, Tormey DC, et al. [phone]). "Toxicity and response criteria of the [facility]". [place] Oncol. 5 (6): 649-55    LABORATORY DATA:  Lab Results  Component Value Date   WBC 6.6 05/10/2022   HGB 14.5 05/10/2022   HCT 43.7 05/10/2022   MCV 92.4 05/10/2022   PLT 167 05/10/2022  Lab Results  Component Value Date   NA 140 05/10/2022   K 3.7 05/10/2022   CL 105 05/10/2022   CO2 29 05/10/2022   Lab Results  Component Value Date   ALT 9 05/10/2022   AST 16 05/10/2022   ALKPHOS 66 05/10/2022   BILITOT 1.4 (H) 05/10/2022      RADIOGRAPHY: No results found.     IMPRESSION/PLAN: 1. ER weakly positive, Intermediate Grade DCIS of the left breast. Dr. Lisbeth Renshaw discusses the pathology findings and reviews the nature of early stage left breast disease. The consensus from the breast conference includes breast conservation with lumpectomy Dr. Lisbeth Renshaw also discusses cases in which radiation may be optional for favorable cases based on final pathology and the patient is interested in forgoing treatment.  We will follow-up with her final results of surgery, but if recommended Dr. Lisbeth Renshaw would anticipate a course of either an ultrahypofractionated or up to 4 weeks of radiotherapy to the left breast. We did discuss risks, benefits, short, and long term effects of treatment if needed.    In a visit lasting 45 minutes, greater than 50% of the time was spent face to face reviewing her case, as well as in preparation of, discussing, and coordinating the patient's care.  The above documentation reflects my direct findings during this shared patient visit. Please see the separate note by Dr. Lisbeth Renshaw on this date for the remainder of the patient's plan of  care.    Carola Rhine, [facility]    **Disclaimer: This note was dictated with voice recognition software. Similar sounding words can inadvertently be transcribed and this note may contain transcription errors which may not have been corrected upon publication of note.**

## 2022-05-16 ENCOUNTER — Encounter: Payer: Self-pay | Admitting: *Deleted

## 2022-05-16 ENCOUNTER — Telehealth: Payer: Self-pay | Admitting: *Deleted

## 2022-05-16 NOTE — Telephone Encounter (Signed)
Spoke with patient to follow up from [facility] 11/15 and assess navigation needs. Patient denies any questions or concerns at this time.  Encouraged her to call should anything arise. Patient verbalized understanding.

## 2022-05-30 ENCOUNTER — Encounter (HOSPITAL_BASED_OUTPATIENT_CLINIC_OR_DEPARTMENT_OTHER): Payer: Self-pay | Admitting: Surgery

## 2022-05-30 ENCOUNTER — Other Ambulatory Visit: Payer: Self-pay

## 2022-05-30 NOTE — Progress Notes (Signed)
   05/30/22 1545  PAT Phone Screen  Is the patient taking a GLP-1 receptor agonist? No  Do You Have Diabetes? No  Do You Have Hypertension? No  Have You Ever Been to the ER for Asthma? No  Have You Taken Oral Steroids in the Past 3 Months? No  Do you Take Phenteramine or any Other Diet Drugs? No  Recent  Lab Work, EKG, CXR? Yes  Where was this test performed? EKG 11/18/21; CBC/CMP 05/10/22  Do you have a history of heart problems? Yes (hx atrial fibrillation)  Cardiologist Name Sanda Klein, MD  Have you ever had tests on your heart? Yes  What cardiac tests were performed? Echo  What date/year were cardiac tests completed? ECHO 12/09/21 EF 60-65%  Results viewable: [facility] Media Tab  Any Recent Hospitalizations? No  Height 5' (1.524 m)  Weight 53.2 kg  Pat Appointment Scheduled No  Reason for No Appointment Not Needed

## 2022-06-02 ENCOUNTER — Encounter: Payer: Self-pay | Admitting: Genetic Counselor

## 2022-06-02 ENCOUNTER — Telehealth: Payer: Self-pay | Admitting: Genetic Counselor

## 2022-06-02 DIAGNOSIS — C50412 Malignant neoplasm of upper-outer quadrant of left female breast: Secondary | ICD-10-CM | POA: Diagnosis not present

## 2022-06-02 DIAGNOSIS — Z1379 Encounter for other screening for genetic and chromosomal anomalies: Secondary | ICD-10-CM | POA: Insufficient documentation

## 2022-06-02 NOTE — Telephone Encounter (Signed)
I attempted to contact Ms. Petteway to discuss her genetic testing results (36 genes). I left a voicemail requesting she call me back at [phone].  Lucille Passy, MS, [facility] Genetic Counselor [email or web address] (P) [phone]

## 2022-06-05 NOTE — Progress Notes (Signed)

## 2022-06-05 NOTE — Anesthesia Preprocedure Evaluation (Signed)
Anesthesia Evaluation  Patient identified by MRN, date of birth, ID band Patient awake    Reviewed: Allergy & Precautions, NPO status , Patient's Chart, lab work & pertinent test results  History of Anesthesia Complications Negative for: history of anesthetic complications  Airway Mallampati: I  TM Distance: >3 FB Neck ROM: Full    Dental  (+) Edentulous Upper, Edentulous Lower, Lower Dentures, Upper Dentures   Pulmonary former smoker   breath sounds clear to auscultation       Cardiovascular hypertension, Pt. on medications (-) angina + dysrhythmias (resolved with ablation) Supra Ventricular Tachycardia  Rhythm:Regular Rate:Normal  11/2021 ECHO: EF 60-65%, normal LVF, normal RVF, mild MR   Neuro/Psych negative neurological ROS     GI/Hepatic Neg liver ROS,GERD  Medicated and Controlled,,  Endo/Other  Hypothyroidism    Renal/GU negative Renal ROS     Musculoskeletal  (+) Arthritis ,    Abdominal   Peds  Hematology  (+) Blood dyscrasia, anemia   Anesthesia Other Findings   Reproductive/Obstetrics H/o cervical cancer                              Anesthesia Physical Anesthesia Plan  ASA: 3  Anesthesia Plan: General   Post-op Pain Management: Tylenol PO (pre-op)* and Minimal or no pain anticipated   Induction: Intravenous  PONV Risk Score and Plan: 3 and Ondansetron, Dexamethasone and Treatment may vary due to age or medical condition  Airway Management Planned: LMA  Additional Equipment: None  Intra-op Plan:   Post-operative Plan:   Informed Consent: I have reviewed the patients History and Physical, chart, labs and discussed the procedure including the risks, benefits and alternatives for the proposed anesthesia with the patient or authorized representative who has indicated his/her understanding and acceptance.       Plan Discussed with: CRNA and Surgeon  Anesthesia  Plan Comments: (Discussed with patient and her 2 children)         Anesthesia Quick Evaluation

## 2022-06-06 ENCOUNTER — Ambulatory Visit (HOSPITAL_BASED_OUTPATIENT_CLINIC_OR_DEPARTMENT_OTHER): Payer: Medicare HMO | Admitting: Anesthesiology

## 2022-06-06 ENCOUNTER — Encounter (HOSPITAL_BASED_OUTPATIENT_CLINIC_OR_DEPARTMENT_OTHER): Payer: Self-pay | Admitting: Surgery

## 2022-06-06 ENCOUNTER — Other Ambulatory Visit: Payer: Self-pay

## 2022-06-06 ENCOUNTER — Encounter (HOSPITAL_BASED_OUTPATIENT_CLINIC_OR_DEPARTMENT_OTHER)
Admission: RE | Disposition: A | Discharge status: Home / Self Care | Payer: Self-pay | Source: Home / Self Care | Attending: Surgery

## 2022-06-06 ENCOUNTER — Ambulatory Visit (HOSPITAL_BASED_OUTPATIENT_CLINIC_OR_DEPARTMENT_OTHER)
Admission: RE | Admit: 2022-06-06 | Discharge: 2022-06-06 | Disposition: A | Discharge status: Home / Self Care | Payer: Medicare HMO | Attending: Surgery | Admitting: Surgery

## 2022-06-06 DIAGNOSIS — D0512 Intraductal carcinoma in situ of left breast: Secondary | ICD-10-CM

## 2022-06-06 DIAGNOSIS — D638 Anemia in other chronic diseases classified elsewhere: Secondary | ICD-10-CM

## 2022-06-06 DIAGNOSIS — I10 Essential (primary) hypertension: Secondary | ICD-10-CM | POA: Diagnosis not present

## 2022-06-06 DIAGNOSIS — N641 Fat necrosis of breast: Secondary | ICD-10-CM | POA: Diagnosis not present

## 2022-06-06 DIAGNOSIS — Z87891 Personal history of nicotine dependence: Secondary | ICD-10-CM | POA: Diagnosis not present

## 2022-06-06 DIAGNOSIS — D630 Anemia in neoplastic disease: Secondary | ICD-10-CM | POA: Diagnosis not present

## 2022-06-06 DIAGNOSIS — R921 Mammographic calcification found on diagnostic imaging of breast: Secondary | ICD-10-CM | POA: Diagnosis not present

## 2022-06-06 DIAGNOSIS — E039 Hypothyroidism, unspecified: Secondary | ICD-10-CM | POA: Insufficient documentation

## 2022-06-06 DIAGNOSIS — K219 Gastro-esophageal reflux disease without esophagitis: Secondary | ICD-10-CM | POA: Insufficient documentation

## 2022-06-06 DIAGNOSIS — C50912 Malignant neoplasm of unspecified site of left female breast: Secondary | ICD-10-CM | POA: Diagnosis not present

## 2022-06-06 HISTORY — PX: BREAST LUMPECTOMY WITH RADIOACTIVE SEED LOCALIZATION: SHX6424

## 2022-06-06 SURGERY — BREAST LUMPECTOMY WITH RADIOACTIVE SEED LOCALIZATION
Anesthesia: General | Site: Breast | Laterality: Left

## 2022-06-06 MED ORDER — ACETAMINOPHEN 325 MG PO TABS
ORAL_TABLET | ORAL | Status: AC
  Filled 2022-06-06: qty 2

## 2022-06-06 MED ORDER — SODIUM CHLORIDE 0.9 % IV SOLN
INTRAVENOUS | Status: AC
  Filled 2022-06-06: qty 10

## 2022-06-06 MED ORDER — DEXAMETHASONE SODIUM PHOSPHATE 4 MG/ML IJ SOLN
INTRAMUSCULAR | Status: DC | PRN
  Administered 2022-06-06: 4 mg via INTRAVENOUS

## 2022-06-06 MED ORDER — FENTANYL CITRATE (PF) 100 MCG/2ML IJ SOLN
INTRAMUSCULAR | Status: DC | PRN
  Administered 2022-06-06 (×2): 50 ug via INTRAVENOUS

## 2022-06-06 MED ORDER — OXYCODONE HCL 5 MG PO TABS: 5.0000 mg | ORAL_TABLET | Freq: Four times a day (QID) | ORAL | 0 refills | Status: DC | PRN

## 2022-06-06 MED ORDER — PHENYLEPHRINE HCL (PRESSORS) 10 MG/ML IV SOLN
INTRAVENOUS | Status: DC | PRN
  Administered 2022-06-06 (×2): 80 ug via INTRAVENOUS

## 2022-06-06 MED ORDER — SUCCINYLCHOLINE CHLORIDE 200 MG/10ML IV SOSY
PREFILLED_SYRINGE | INTRAVENOUS | Status: DC | PRN
  Administered 2022-06-06: 100 mg via INTRAVENOUS

## 2022-06-06 MED ORDER — CHLORHEXIDINE GLUCONATE CLOTH 2 % EX PADS: 6.0000 | MEDICATED_PAD | Freq: Once | CUTANEOUS | Status: DC

## 2022-06-06 MED ORDER — CEFAZOLIN SODIUM-DEXTROSE 2-4 GM/100ML-% IV SOLN
2.0000 g | INTRAVENOUS | Status: AC
  Administered 2022-06-06: 2 g via INTRAVENOUS

## 2022-06-06 MED ORDER — PROPOFOL 10 MG/ML IV BOLUS
INTRAVENOUS | Status: DC | PRN
  Administered 2022-06-06: 100 mg via INTRAVENOUS
  Administered 2022-06-06 (×2): 50 mg via INTRAVENOUS

## 2022-06-06 MED ORDER — GABAPENTIN 300 MG PO CAPS: 300.0000 mg | ORAL_CAPSULE | ORAL | Status: DC

## 2022-06-06 MED ORDER — BUPIVACAINE HCL (PF) 0.25 % IJ SOLN
INTRAMUSCULAR | Status: DC | PRN
  Administered 2022-06-06: 20 mL

## 2022-06-06 MED ORDER — LACTATED RINGERS IV SOLN: INTRAVENOUS | Status: DC

## 2022-06-06 MED ORDER — GABAPENTIN 300 MG PO CAPS
ORAL_CAPSULE | ORAL | Status: AC
  Filled 2022-06-06: qty 1

## 2022-06-06 MED ORDER — ACETAMINOPHEN 325 MG PO TABS
650.0000 mg | ORAL_TABLET | Freq: Once | ORAL | Status: AC
  Administered 2022-06-06: 650 mg via ORAL

## 2022-06-06 MED ORDER — ACETAMINOPHEN 500 MG PO TABS: 1000.0000 mg | ORAL_TABLET | Freq: Once | ORAL | Status: DC

## 2022-06-06 MED ORDER — CEFAZOLIN SODIUM-DEXTROSE 2-4 GM/100ML-% IV SOLN
INTRAVENOUS | Status: AC
  Filled 2022-06-06: qty 100

## 2022-06-06 MED ORDER — ACETAMINOPHEN 500 MG PO TABS: 1000.0000 mg | ORAL_TABLET | ORAL | Status: AC

## 2022-06-06 MED ORDER — LIDOCAINE HCL (CARDIAC) PF 100 MG/5ML IV SOSY: PREFILLED_SYRINGE | INTRAVENOUS | Status: DC | PRN

## 2022-06-06 MED ORDER — FENTANYL CITRATE (PF) 100 MCG/2ML IJ SOLN: 25.0000 ug | INTRAMUSCULAR | Status: DC | PRN

## 2022-06-06 MED ORDER — ACETAMINOPHEN 500 MG PO TABS
ORAL_TABLET | ORAL | Status: AC
  Filled 2022-06-06: qty 2

## 2022-06-06 MED ORDER — ONDANSETRON HCL 4 MG/2ML IJ SOLN
INTRAMUSCULAR | Status: DC | PRN
  Administered 2022-06-06: 4 mg via INTRAVENOUS

## 2022-06-06 MED ORDER — SODIUM CHLORIDE 0.9 % IV SOLN
INTRAVENOUS | Status: DC | PRN
  Administered 2022-06-06: 500 mL

## 2022-06-06 SURGICAL SUPPLY — 48 items
ADH SKN CLS APL DERMABOND .7 (GAUZE/BANDAGES/DRESSINGS) ×1
APL PRP STRL LF DISP 70% ISPRP (MISCELLANEOUS) ×1
APPLIER CLIP 9.375 MED OPEN (MISCELLANEOUS)
APR CLP MED 9.3 20 MLT OPN (MISCELLANEOUS)
BLADE SURG 15 STRL LF DISP TIS (BLADE) ×1 IMPLANT
BLADE SURG 15 STRL SS (BLADE) ×1
CANISTER SUC SOCK COL 7IN (MISCELLANEOUS) IMPLANT
CANISTER SUCT 1200ML W/VALVE (MISCELLANEOUS) IMPLANT
CHLORAPREP W/TINT 26 (MISCELLANEOUS) ×1 IMPLANT
CLIP APPLIE 9.375 MED OPEN (MISCELLANEOUS) IMPLANT
COVER BACK TABLE 60X90IN (DRAPES) ×1 IMPLANT
COVER MAYO STAND STRL (DRAPES) ×1 IMPLANT
COVER PROBE CYLINDRICAL 5X96 (MISCELLANEOUS) ×1 IMPLANT
DERMABOND ADVANCED .7 DNX12 (GAUZE/BANDAGES/DRESSINGS) ×1 IMPLANT
DRAPE LAPAROTOMY 100X72 PEDS (DRAPES) ×1 IMPLANT
DRAPE UTILITY XL STRL (DRAPES) ×1 IMPLANT
ELECT COATED BLADE 2.86 ST (ELECTRODE) ×1 IMPLANT
ELECT REM PT RETURN 9FT ADLT (ELECTROSURGICAL) ×1
ELECTRODE REM PT RTRN 9FT ADLT (ELECTROSURGICAL) ×1 IMPLANT
GLOVE BIOGEL PI IND STRL 7.5 (GLOVE) IMPLANT
GLOVE BIOGEL PI IND STRL 8 (GLOVE) ×1 IMPLANT
GLOVE SURG SYN 7.5  E (GLOVE) ×1
GLOVE SURG SYN 7.5 E (GLOVE) ×1 IMPLANT
GLOVE SURG SYN 7.5 PF PI (GLOVE) IMPLANT
GLOVE SURG SYN 8.0 (GLOVE) ×1 IMPLANT
GLOVE SURG SYN 8.0 PF PI (GLOVE) IMPLANT
GOWN STRL REUS W/ TWL LRG LVL3 (GOWN DISPOSABLE) ×2 IMPLANT
GOWN STRL REUS W/ TWL XL LVL3 (GOWN DISPOSABLE) ×1 IMPLANT
GOWN STRL REUS W/TWL LRG LVL3 (GOWN DISPOSABLE) ×2
GOWN STRL REUS W/TWL XL LVL3 (GOWN DISPOSABLE) ×1
HEMOSTAT ARISTA ABSORB 3G PWDR (HEMOSTASIS) IMPLANT
HEMOSTAT SNOW SURGICEL 2X4 (HEMOSTASIS) IMPLANT
KIT MARKER MARGIN INK (KITS) ×1 IMPLANT
NDL HYPO 25X1 1.5 SAFETY (NEEDLE) ×1 IMPLANT
NEEDLE HYPO 25X1 1.5 SAFETY (NEEDLE) ×1 IMPLANT
NS IRRIG 1000ML POUR BTL (IV SOLUTION) ×1 IMPLANT
PACK BASIN DAY SURGERY FS (CUSTOM PROCEDURE TRAY) ×1 IMPLANT
PENCIL SMOKE EVACUATOR (MISCELLANEOUS) ×1 IMPLANT
SLEEVE SCD COMPRESS KNEE MED (STOCKING) ×1 IMPLANT
SPONGE T-LAP 4X18 ~~LOC~~+RFID (SPONGE) ×1 IMPLANT
SUT MNCRL AB 4-0 PS2 18 (SUTURE) ×1 IMPLANT
SUT SILK 2 0 SH (SUTURE) IMPLANT
SUT VICRYL 3-0 CR8 SH (SUTURE) ×1 IMPLANT
SYR CONTROL 10ML LL (SYRINGE) ×1 IMPLANT
TOWEL GREEN STERILE FF (TOWEL DISPOSABLE) ×1 IMPLANT
TRAY FAXITRON CT DISP (TRAY / TRAY PROCEDURE) ×1 IMPLANT
TUBE CONNECTING 20X1/4 (TUBING) IMPLANT
YANKAUER SUCT BULB TIP NO VENT (SUCTIONS) IMPLANT

## 2022-06-06 NOTE — Anesthesia Postprocedure Evaluation (Signed)
Anesthesia Post Note  Patient: Jill Mcdowell  Procedure(s) Performed: LEFT BREAST BRACKETED LUMPECTOMY WITH RADIOACTIVE SEED LOCALIZATION (Left: Breast)     Patient location during evaluation: Phase II Anesthesia Type: General Level of consciousness: awake and alert, patient cooperative and oriented Pain management: pain level controlled Vital Signs Assessment: post-procedure vital signs reviewed and stable Respiratory status: spontaneous breathing, nonlabored ventilation and respiratory function stable Cardiovascular status: blood pressure returned to baseline and stable Postop Assessment: no apparent nausea or vomiting, able to ambulate and adequate PO intake Anesthetic complications: no   No notable events documented.  Last Vitals:  Vitals:   06/06/22 1045 06/06/22 1058  BP: 133/65   Pulse: 75   Resp: 13   Temp:    SpO2: 91% 93%    Last Pain:  Vitals:   06/06/22 1058  TempSrc:   PainSc: 2                  Mayeli Bornhorst,E. Corrisa Gibby

## 2022-06-06 NOTE — Transfer of Care (Signed)
Immediate Anesthesia Transfer of Care Note  Patient: Jill Mcdowell  Procedure(s) Performed: LEFT BREAST BRACKETED LUMPECTOMY WITH RADIOACTIVE SEED LOCALIZATION (Left: Breast)  Patient Location: PACU  Anesthesia Type:General  Level of Consciousness: sedated  Airway & Oxygen Therapy: Patient Spontanous Breathing and Patient connected to face mask oxygen  Post-op Assessment: Report given to RN and Post -op Vital signs reviewed and stable  Post vital signs: Reviewed and stable  Last Vitals:  Vitals Value Taken Time  BP 122/62 06/06/22 0952  Temp    Pulse 71 06/06/22 0953  Resp 16 06/06/22 0953  SpO2 99 % 06/06/22 0953  Vitals shown include unvalidated device data.  Last Pain:  Vitals:   06/06/22 0716  TempSrc: Oral  PainSc: 0-No pain         Complications: No notable events documented.

## 2022-06-06 NOTE — Interval H&P Note (Signed)
History and Physical Interval Note:  06/06/2022 8:23 AM  Jill Mcdowell  has presented today for surgery, with the diagnosis of LEFT BREAST DCIS.  The various methods of treatment have been discussed with the patient and family. After consideration of risks, benefits and other options for treatment, the patient has consented to  Procedure(s): LEFT BREAST BRACKETED LUMPECTOMY WITH RADIOACTIVE SEED LOCALIZATION (Left) as a surgical intervention.  The patient's history has been reviewed, patient examined, no change in status, stable for surgery.  I have reviewed the patient's chart and labs.  Questions were answered to the patient's satisfaction.   The procedure has been discussed with the patient. Alternatives to surgery have been discussed with the patient.  Risks of surgery include bleeding,  Infection,  Seroma formation, death,  and the need for further surgery.   The patient understands and wishes to proceed.   Turner Daniels MD

## 2022-06-06 NOTE — Op Note (Signed)
Preoperative diagnosis: Left breast DCIS upper outer quadrant  Postoperative diagnosis: Same  Procedure: Left breast seed localized lumpectomy using a bracketed approach  Surgeon: Erroll Luna, MD  Anesthesia: General with 0.25% Marcaine plain  EBL: 20 cc  Specimen: Left breast tissue with 2 seeds and localizing clip with calcifications verified by Faxitron  Drains: None  IV fluids: Per anesthesia record  Indications for procedure: The patient is a pleasant 88 year old female diagnosed with left breast DCIS on her mammogram.  We discussed the pros and cons of surgery of advanced age as well as medical management and follow-up.  After discussion of her options she opted to proceed with left breast lumpectomy after being seen in the multidisciplinary clinic.  She understood there is an increased risk due to advanced age but overall her health has been good and she felt that this was her best option.  We discussed observation as well given her advanced age and close follow-up.  She had opted for surgery after reviewing all of her options.The procedure has been discussed with the patient. Alternatives to surgery have been discussed with the patient.  Risks of surgery include bleeding,  Infection,  Seroma formation, death,  and the need for further surgery.   The patient understands and wishes to proceed.     Description of procedure: The patient was met in the holding area and questions were answered.  Seizure placed as an outpatient and films were available for review.  She was then taken back to the operating room.  She was placed upon on the OR table.  After induction of general esthesia, left breast was prepped draped in sterile fashion and timeout was performed.  Proper patient, site and procedure were verified and films were available for review.  She received appropriate preoperative antibiotics.  Neoprobe was used to identify both seeds left breast upper outer quadrant.  Incision was made  in the lateral to upper outer quadrant region.  Dissection was carried down and procedure down to 5.  All tissue between these areas was excised widely.  Hemostasis was achieved with cautery.  The Faxitron image revealed the seeds and clip present with the calcifications.  Of note the tissue was oriented with ink and fixed with the fixative.  This was sent on to pathology.  Additional anterior margin of skin was excised after reviewing the images as well.  This was sent separately as well.  Irrigation was used.  Hemostasis achieved with cautery.  Local anesthetic infiltrated throughout of 0.25% Marcaine plain.  The deep layers were closed with 3-0 Vicryl.  4 Monocryl was used to close skin in a subcuticular fashion.  Dermabond applied.  All counts found to be correct.  The patient was awoke extubated taken to recovery in satisfactory condition.

## 2022-06-06 NOTE — Anesthesia Procedure Notes (Addendum)
Procedure Name: Intubation Date/Time: 06/06/2022 8:57 AM  Performed by: Maryella Shivers, CRNAPre-anesthesia Checklist: Patient identified, Emergency Drugs available, Suction available and Patient being monitored Patient Re-evaluated:Patient Re-evaluated prior to induction Oxygen Delivery Method: Circle system utilized Preoxygenation: Pre-oxygenation with 100% oxygen Induction Type: IV induction Ventilation: Mask ventilation without difficulty Laryngoscope Size: Mac and 3 Grade View: Grade I Tube type: Oral Tube size: 7.0 mm Number of attempts: 1 Airway Equipment and Method: Stylet and Oral airway Placement Confirmation: ETT inserted through vocal cords under direct vision, positive ETCO2 and breath sounds checked- equal and bilateral Secured at: 20 cm Tube secured with: Tape Dental Injury: Teeth and Oropharynx as per pre-operative assessment

## 2022-06-06 NOTE — Discharge Instructions (Addendum)
[facility] Office Phone Number [phone]  BREAST BIOPSY/ PARTIAL MASTECTOMY: POST OP INSTRUCTIONS  Always review your discharge instruction sheet given to you by the facility where your surgery was performed.  IF YOU HAVE DISABILITY OR FAMILY LEAVE FORMS, YOU MUST BRING THEM TO THE OFFICE FOR PROCESSING.  DO NOT GIVE THEM TO YOUR DOCTOR.  A prescription for pain medication may be given to you upon discharge.  Take your pain medication as prescribed, if needed.  If narcotic pain medicine is not needed, then you may take acetaminophen (Tylenol) or ibuprofen (Advil) as needed. Take your usually prescribed medications unless otherwise directed If you need a refill on your pain medication, please contact your pharmacy.  They will contact our office to request authorization.  Prescriptions will not be filled after 5pm or on week-ends. You should eat very light the first 24 hours after surgery, such as soup, crackers, pudding, etc.  Resume your normal diet the day after surgery. Most patients will experience some swelling and bruising in the breast.  Ice packs and a good support bra will help.  Swelling and bruising can take several days to resolve.  It is common to experience some constipation if taking pain medication after surgery.  Increasing fluid intake and taking a stool softener will usually help or prevent this problem from occurring.  A mild laxative (Milk of Magnesia or Miralax) should be taken according to package directions if there are no bowel movements after 48 hours. Unless discharge instructions indicate otherwise, you may remove your bandages 24-48 hours after surgery, and you may shower at that time.  You may have steri-strips (small skin tapes) in place directly over the incision.  These strips should be left on the skin for 7-10 days.  If your surgeon used skin glue on the incision, you may shower in 24 hours.  The glue will flake off over the next 2-3 weeks.  Any  sutures or staples will be removed at the office during your follow-up visit. ACTIVITIES:  You may resume regular daily activities (gradually increasing) beginning the next day.  Wearing a good support bra or sports bra minimizes pain and swelling.  You may have sexual intercourse when it is comfortable. You may drive when you no longer are taking prescription pain medication, you can comfortably wear a seatbelt, and you can safely maneuver your car and apply brakes. RETURN TO WORK:  ______________________________________________________________________________________ You should see your doctor in the office for a follow-up appointment approximately two weeks after your surgery.  Your doctor's nurse will typically make your follow-up appointment when she calls you with your pathology report.  Expect your pathology report 2-3 business days after your surgery.  You may call to check if you do not hear from us after three days. OTHER INSTRUCTIONS: _______________________________________________________________________________________________ _____________________________________________________________________________________________________________________________________ _____________________________________________________________________________________________________________________________________ _____________________________________________________________________________________________________________________________________  WHEN TO CALL YOUR DOCTOR: Fever over 101.0 Nausea and/or vomiting. Extreme swelling or bruising. Continued bleeding from incision. Increased pain, redness, or drainage from the incision.  The clinic staff is available to answer your questions during regular business hours.  Please don't hesitate to call and ask to speak to one of the nurses for clinical concerns.  If you have a medical emergency, go to the nearest emergency room or call 911.  A surgeon from [facility] is always on call at the hospital.  For further questions, please visit [email or web address]    Post Anesthesia Home Care Instructions  Activity: Get plenty of rest for the remainder of   of the day. A responsible individual must stay with you for 24 hours following the procedure.  For the next 24 hours, DO NOT: -Drive a car -Paediatric nurse -Drink alcoholic beverages -Take any medication unless instructed by your physician -Make any legal decisions or sign important papers.  Meals: Start with liquid foods such as gelatin or soup. Progress to regular foods as tolerated. Avoid greasy, spicy, heavy foods. If nausea and/or vomiting occur, drink only clear liquids until the nausea and/or vomiting subsides. Call your physician if vomiting continues.  Special Instructions/Symptoms: Your throat may feel dry or sore from the anesthesia or the breathing tube placed in your throat during surgery. If this causes discomfort, gargle with warm salt water. The discomfort should disappear within 24 hours.  If you had a scopolamine patch placed behind your ear for the management of post- operative nausea and/or vomiting:  1. The medication in the patch is effective for 72 hours, after which it should be removed.  Wrap patch in a tissue and discard in the trash. Wash hands thoroughly with soap and water. 2. You may remove the patch earlier than 72 hours if you experience unpleasant side effects which may include dry mouth, dizziness or visual disturbances. 3. Avoid touching the patch. Wash your hands with soap and water after contact with the patch.    May take Tylenol after 1:30pm if needed.

## 2022-06-06 NOTE — H&P (Signed)
History of Present Illness: Jill Mcdowell is a 88 y.o. female who is seen today as an office consultation for evaluation of Breast Cancer .   Patient seen today in the [facility] for evaluation of left breast DCIS. She underwent regular mammogram which showed a 1 cm cluster of pleomorphic calcifications of outer quadrant core biopsy proven to be a DCIS ER negative PR negative. She is extremely fit for age. She lives in [place] but is able to drive and function independently. Family is with her today. She has no complaints. Denies any history of breast mass nipple discharge or change in the appearance of either breast.  Review of Systems: A complete review of systems was obtained from the patient. I have reviewed this information and discussed as appropriate with the patient. See HPI as well for other ROS.    Medical History: Past Medical History:  Diagnosis Date  Anemia  Arthritis  GERD (gastroesophageal reflux disease)  History of cancer  Thyroid disease   Patient Active Problem List  Diagnosis  Neoplasm of uncertain behavior of thyroid gland  Multiple thyroid nodules  Acquired hypothyroidism  Papillary thyroid carcinoma (CMS-HCC)  Status post total thyroidectomy  Ductal carcinoma in situ (DCIS) of left breast  Sensorineural hearing loss (SNHL) of both ears  PVC's (premature ventricular contractions)  Paroxysmal atrial fibrillation (CMS-HCC)  Eye redness  Cough  Visual disturbance  Heartburn  Constipation  Headache  Hearing loss   Past Surgical History:  Procedure Laterality Date  . NM myocar perf wall motion N/A 02/19/2008  KYPHOPLASTY N/A 12/18/2018  .thyroidectomy N/A 05/16/2021  CHOLECYSTECTOMY  HYSTERECTOMY    Allergies  Allergen Reactions  Nebivolol Hcl Unknown  Other reaction(s): near syncope  Phenothiazines Unknown  Other reaction(s): rash  Pneumococcal 23-Valent Polysaccharide Vaccine Unknown  Other reaction(s): local reaction   Sulfamethoxazole-Trimethoprim Unknown  Other reaction(s): rash  Latex Other (See Comments)  Skin irritation  Skin irritation Other reaction(s): Unknown  Metoprolol Other (See Comments)  hallucinations hallucinations  Sulfa (Sulfonamide Antibiotics) Rash   Current Outpatient Medications on File Prior to Visit  Medication Sig Dispense Refill  acetaminophen (TYLENOL) 500 MG tablet Take by mouth  calcium carbonate (TUMS) 200 mg calcium (500 mg) chewable tablet Take by mouth  dilTIAZem (DILT-XR) 180 MG XR capsule Take 180 mg by mouth once daily  IRON POLYSACCHARIDES 150 mg iron capsule  omeprazole (PRILOSEC) 20 MG DR capsule Take by mouth  SYNTHROID 75 mcg tablet Take 75 mcg by mouth once daily  diltiazem (DILT-XR) 180 MG XR capsule Take 1 capsule by mouth once daily  levothyroxine (SYNTHROID) 50 MCG tablet Take by mouth   No current facility-administered medications on file prior to visit.   Family History  Problem Relation Age of Onset  High blood pressure (Hypertension) Father  Stroke Father    Social History   Tobacco Use  Smoking Status Former  Types: Cigarettes  Quit date: 1989  Years since quitting: 34.8  Smokeless Tobacco Never    Social History   Socioeconomic History  Marital status: Married  Tobacco Use  Smoking status: Former  Types: Cigarettes  Quit date: 1989  Years since quitting: 34.8  Smokeless tobacco: Never  Vaping Use  Vaping Use: Never used  Substance and Sexual Activity  Alcohol use: Never  Drug use: Never  Sexual activity: Defer   Objective:  There were no vitals filed for this visit.  There is no height or weight on file to calculate BMI.  Physical Exam HENT:  Head:  Normocephalic.  Cardiovascular:  Rate and Rhythm: Normal rate.  Pulmonary:  Effort: Pulmonary effort is normal.  Chest:  Breasts: Right: Normal. No mass or nipple discharge.  Left: Normal. No mass or nipple discharge.  Musculoskeletal:  General: Normal range  of motion.  Cervical back: Normal range of motion.  Lymphadenopathy:  Upper Body:  Right upper body: No supraclavicular or axillary adenopathy.  Left upper body: No supraclavicular or axillary adenopathy.  Neurological:  General: No focal deficit present.  Mental Status: She is alert.  Psychiatric:  Mood and Affect: Mood normal.     Labs, Imaging and Diagnostic Testing: Calcifications left breast upper outer quadrant 1 cm in size. ADDITIONAL INFORMATION: PROGNOSTIC INDICATORS Results: IMMUNOHISTOCHEMICAL AND MORPHOMETRIC ANALYSIS PERFORMED MANUALLY Estrogen Receptor: 10%, POSITIVE, WEAK STAINING INTENSITY Progesterone Receptor: 0%, NEGATIVE COMMENT: The negative hormone receptor study(ies) in this case has an internal positive control. The cancer in this sample has a low level (1%-10%) of ER expression by IHC. There are limited data on the overall benefit of endocrine therapies for patients with low level (1%-10%) ER expression, but they currently suggest possible benefit, so patients are considered eligible for endocrine treatment. There are data that suggest invasive cancers with these results are heterogeneous in both behavior and biology and often have gene expression profiles more similar to ER-negative cancers. REFERENCE RANGE ESTROGEN RECEPTOR NEGATIVE 0% POSITIVE =>1% REFERENCE RANGE PROGESTERONE RECEPTOR NEGATIVE 0% POSITIVE =>1% All controls stained appropriately Casimer Lanius MD Pathologist, Electronic Signature ( Signed 05/05/2022) FINAL DIAGNOSIS 1 of 2 FINAL for DEANNIE, RESETAR ([id number]) Diagnosis Breast, left, needle core biopsy, UOQ - DUCTAL CARCINOMA IN SITU (DCIS), GRADE 2-3 (INTERMEDIATE-HIGH). - MICROCALCIFICATIONS PRESENT. Diagnosis Note [facility] [employer] Janett Billow) was telephoned the diagnosis on 05/04/2022 by Dr. Spero Curb. Dr. Martinique has also reviewed the slides (intradepartmental QA review for neoplasia). Ancillary ER and PR  immunohistochemical (IHC) stain results will be reported in an addendum. Maryan Puls MD Pathologist, Electronic Signature (Case signed 05/04/2022) Assessment and Plan:   Diagnoses and all orders for this visit:  Ductal carcinoma in situ (DCIS) of left breast    Discussed nonoperative management as well as lumpectomy for her. She is in good health therefore think she would tolerate a lumpectomy and has a high likelihood of living at least 5 more years. I discussed observation doing nothing at this point since this is not invasive disease but did explain that it may progress. After discussion the above the patient wished to proceed with left breast seed localized lumpectomy.The procedure has been discussed with the patient. Alternatives to surgery have been discussed with the patient. Risks of surgery include bleeding, Infection, Seroma formation, death, and the need for further surgery. The patient understands and wishes to proceed.   No follow-ups on file.  Kennieth Francois, MD

## 2022-06-07 ENCOUNTER — Encounter (HOSPITAL_BASED_OUTPATIENT_CLINIC_OR_DEPARTMENT_OTHER): Payer: Self-pay | Admitting: Surgery

## 2022-06-07 LAB — SURGICAL PATHOLOGY

## 2022-06-08 ENCOUNTER — Encounter: Payer: Self-pay | Admitting: *Deleted

## 2022-06-09 ENCOUNTER — Telehealth: Payer: Self-pay | Admitting: Genetic Counselor

## 2022-06-09 NOTE — Telephone Encounter (Signed)
I contacted [name] to discuss her genetic testing results. No pathogenic variants were identified in the 36 genes analyzed. Of note, a variant of uncertain significance was identified in the CDH1 gene. Detailed clinic note to follow.  The test report has been scanned into EPIC and is located under the Molecular Pathology section of the Results Review tab.  A portion of the result report is included below for reference.   Lucille Passy, MS, [facility] Genetic Counselor [email or web address] (P) [phone]

## 2022-06-12 ENCOUNTER — Encounter: Payer: Self-pay | Admitting: Genetic Counselor

## 2022-06-12 ENCOUNTER — Ambulatory Visit: Payer: Self-pay | Admitting: Genetic Counselor

## 2022-06-12 DIAGNOSIS — Z1379 Encounter for other screening for genetic and chromosomal anomalies: Secondary | ICD-10-CM

## 2022-06-12 NOTE — Progress Notes (Signed)
HPI:   [name] was previously seen in the [facility] due to a personal and family history of cancer and concerns regarding a hereditary predisposition to cancer. Please refer to our prior cancer genetics clinic note for more information regarding our discussion, assessment and recommendations, at the time. [name] recent genetic test results were disclosed to her, as were recommendations warranted by these results. These results and recommendations are discussed in more detail below.  CANCER HISTORY:  Oncology History  Ductal carcinoma in situ (DCIS) of left breast  05/03/2022 Cancer Staging   Staging form: Breast, AJCC 8th Edition - Clinical stage from 05/03/2022: Stage 0 (cTis (DCIS), cN0, cM0, G3, ER+, PR-, HER2: Not Assessed) - Signed by Jill Merle, MD on 05/09/2022 Stage prefix: Initial diagnosis Histologic grading system: 3 grade system   05/03/2022 Initial Biopsy   Diagnosis Breast, left, needle core biopsy, UOQ - DUCTAL CARCINOMA IN SITU (DCIS), GRADE 2-3 (INTERMEDIATE-HIGH). - MICROCALCIFICATIONS PRESENT.  PROGNOSTIC INDICATORS Results: IMMUNOHISTOCHEMICAL AND MORPHOMETRIC ANALYSIS PERFORMED MANUALLY Estrogen Receptor: 10%, POSITIVE, WEAK STAINING INTENSITY Progesterone Receptor: 0%, NEGATIVE COMMENT: The negative hormone receptor study(ies) in this case has an internal positive control. The cancer in this sample has a low level (1%-10%) of ER expression by IHC. There are limited data on the overall benefit of endocrine therapies for patients with low level (1%-10%) ER expression, but they currently suggest possible benefit, so patients are considered eligible for endocrine treatment. There are data that suggest invasive cancers with these results are heterogeneous in both behavior and biology and often have gene expression profiles more similar to ER-negative cancers.   05/08/2022 Initial Diagnosis   Ductal carcinoma in situ (DCIS) of left breast     Genetic Testing   [facility] CancerNext Panel+RNA was Negative. Of note, a variant of uncertain significance was detected in the CDH1 gene (p.D768N). Report date is 06/01/2022.  The CancerNext gene panel offered by [employer] includes sequencing, rearrangement analysis, and RNA analysis for the following 36 genes:   APC, ATM, AXIN2, BARD1, BMPR1A, BRCA1, BRCA2, BRIP1, CDH1, CDK4, CDKN2A, CHEK2, DICER1, HOXB13, EPCAM, GREM1, MLH1, MSH2, MSH3, MSH6, MUTYH, NBN, NF1, NTHL1, PALB2, PMS2, POLD1, POLE, PTEN, RAD51C, RAD51D, RECQL, SMAD4, SMARCA4, STK11, and TP53.      FAMILY HISTORY:  We obtained a detailed, 4-generation family history.  Significant diagnoses are listed below:      Family History  Problem Relation Age of Onset   Pancreatic cancer Mother     Stroke Father     Ovarian cancer Sister     Colon cancer Brother 53   Lung cancer Brother           [name] has 3 brothers. One brother was diagnosed with colon cancer at age 29 and a second brother was diagnosed with lung and brain cancer at an unknown age, both brothers are deceased. She has 3 sisters and her family reports that one sister had a history of ovarian cancer. Additionally, [name] family members report her mother likely had pancreatic cancer, she is deceased. [name] is unaware of previous family history of genetic testing for hereditary cancer risks. There is no reported Ashkenazi Jewish ancestry.   GENETIC TEST RESULTS:  The [facility] CancerNext Panel found no pathogenic mutations.  The CancerNext gene panel offered by [employer] includes sequencing, rearrangement analysis, and RNA analysis for the following 36 genes:   APC, ATM, AXIN2, BARD1, BMPR1A, BRCA1, BRCA2, BRIP1, CDH1, CDK4, CDKN2A, CHEK2, DICER1, HOXB13, EPCAM,  GREM1, MLH1, MSH2, MSH3, MSH6, MUTYH, NBN, NF1, NTHL1, PALB2, PMS2, POLD1, POLE, PTEN, RAD51C, RAD51D, RECQL, SMAD4, SMARCA4, STK11, and TP53.    The test report has been scanned into EPIC and is  located under the Molecular Pathology section of the Results Review tab.  A portion of the result report is included below for reference. Genetic testing reported out on 06/01/2022.       Genetic testing identified a variant of uncertain significance (VUS) in the CDH1 gene called p.D768N.  At this time, it is unknown if this variant is associated with an increased risk for cancer or if it is benign, but most uncertain variants are reclassified to benign. It should not be used to make medical management decisions. With time, we suspect the laboratory will determine the significance of this variant, if any. If the laboratory reclassifies this variant, we will attempt to contact [name] to discuss it further.   Even though a pathogenic variant was not identified, possible explanations for her personal history of cancer may include: There may be no hereditary risk for cancer in the family. The cancers in [name] and/or her family may be due to other genetic or environmental factors. There may be a gene mutation in one of these genes that current testing methods cannot detect, but that chance is small. There could be another gene that has not yet been discovered, or that we have not yet tested, that is responsible for the cancer diagnoses in the family.   Therefore, it is important to remain in touch with cancer genetics in the future so that we can continue to offer [name] the most up to date genetic testing.   ADDITIONAL GENETIC TESTING:  We discussed with [name] that her genetic testing was fairly extensive.  If there are genes identified to increase cancer risk that can be analyzed in the future, we would be happy to discuss and coordinate this testing at that time.     CANCER SCREENING RECOMMENDATIONS:  [name] test result is considered negative (normal).  This means that we have not identified a hereditary cause for her personal and family history of cancer at this time.   An  individual's cancer risk and medical management are not determined by genetic test results alone. Overall cancer risk assessment incorporates additional factors, including personal medical history, family history, and any available genetic information that may result in a personalized plan for cancer prevention and surveillance. Therefore, it is recommended she continue to follow the cancer management and screening guidelines provided by her oncology and primary healthcare provider.  RECOMMENDATIONS FOR FAMILY MEMBERS:   Since she did not inherit a mutation in a cancer predisposition gene included on this panel, her children could not have inherited a mutation from her in one of these genes. We do not recommend familial testing for the CDH1 variant of uncertain significance (VUS).  FOLLOW-UP:  Cancer genetics is a rapidly advancing field and it is possible that new genetic tests will be appropriate for her and/or her family members in the future. We encouraged her to remain in contact with cancer genetics on an annual basis so we can update her personal and family histories and let her know of advances in cancer genetics that may benefit this family.   Our contact number was provided. [name] questions were answered to her satisfaction, and she knows she is welcome to call us at anytime with additional questions or concerns.   Lucille Passy, MS,  [facility] Genetic Counselor [email or web address] (P) [phone]

## 2022-06-26 NOTE — Progress Notes (Unsigned)
[place]   [phone] [phone]   Clinic Follow up Note   Patient Care Team: Rankins, Bill Salinas, MD as PCP - General (Family Medicine) Croitoru, Dani Gobble, MD as PCP - Cardiology (Cardiology) Lavonna Monarch, MD (Inactive) as Consulting Physician (Dermatology) Mauro Kaufmann, RN as Oncology Nurse Navigator Rockwell Germany, RN as Oncology Nurse Navigator Truitt Merle, MD as Consulting Physician (Hematology) Kyung Rudd, MD as Consulting Physician (Radiation Oncology) Erroll Luna, MD as Consulting Physician (General Surgery) 06/27/2022  I connected with Jill Mcdowell on 06/27/22 at 11:20 AM EST by telephone and verified that I am speaking with the correct person using two identifiers.   I discussed the limitations, risks, security and privacy concerns of performing an evaluation and management service by telephone and the availability of in person appointments. I also discussed with the patient that there may be a patient responsible charge related to this service. The patient expressed understanding and agreed to proceed.   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  I reviewed her surgical pathology results with her.  Margins were negative. -Given her advanced age, ER 10% weakly positive, PR negative, I do not recommend adjuvant antiestrogen therapy. -Patient is 89 year old, but is still active and functions well.  I encouraged her to continue annual mammogram. -Follow-up with [place] in 6 months, then as needed after.  Plan -Follow-up with NP Jill Mcdowell in 6 months  SUMMARY OF ONCOLOGIC HISTORY: Oncology History  Ductal carcinoma in situ (DCIS) of left breast  05/03/2022 Cancer Staging   Staging form: Breast, AJCC 8th  Edition - Clinical stage from 05/03/2022: Stage 0 (cTis (DCIS), cN0, cM0, G3, ER+, PR-, HER2: Not Assessed) - Signed by Truitt Merle, MD on 05/09/2022 Stage prefix: Initial diagnosis Histologic grading system: 3 grade system   05/03/2022 Initial Biopsy   Diagnosis Breast, left, needle core biopsy, UOQ - DUCTAL CARCINOMA IN SITU (DCIS), GRADE 2-3 (INTERMEDIATE-HIGH). - MICROCALCIFICATIONS PRESENT.  PROGNOSTIC INDICATORS Results: IMMUNOHISTOCHEMICAL AND MORPHOMETRIC ANALYSIS PERFORMED MANUALLY Estrogen Receptor: 10%, POSITIVE, WEAK STAINING INTENSITY Progesterone Receptor: 0%, NEGATIVE COMMENT: The negative hormone receptor study(ies) in this case has an internal positive control. The cancer in this sample has a low level (1%-10%) of ER expression by IHC. There are limited data on the overall benefit of endocrine therapies for patients with low level (1%-10%) ER expression, but they currently suggest possible benefit, so patients are considered eligible for endocrine treatment. There are data that suggest invasive cancers with these results are heterogeneous in both behavior and biology and often have gene expression profiles more similar to ER-negative cancers.   05/08/2022 Initial Diagnosis   Ductal carcinoma in situ (DCIS) of left breast    Genetic Testing   [facility] CancerNext Panel+RNA was Negative. Of note, a variant of uncertain significance was detected in the CDH1 gene (p.[id number]). Report date is 06/01/2022.  The CancerNext gene panel offered by [employer] includes sequencing, rearrangement analysis, and RNA analysis for the following 36 genes:   APC, ATM, AXIN2, BARD1, BMPR1A, BRCA1, BRCA2, BRIP1, CDH1, CDK4, CDKN2A, CHEK2, DICER1, HOXB13, EPCAM, GREM1, MLH1, MSH2, MSH3, MSH6, MUTYH, NBN, NF1, NTHL1, PALB2, PMS2, POLD1, POLE, PTEN, RAD51C, RAD51D, RECQL, SMAD4, SMARCA4, STK11, and TP53.    06/06/2022 Cancer Staging   Staging form:  Breast, AJCC 8th Edition - Pathologic stage from  06/06/2022: Stage Unknown (pTis (DCIS), pNX, cM0, G3, ER+, PR-, HER2: Not Assessed) - Signed by Truitt Merle, MD on 06/26/2022 Stage prefix: Initial diagnosis Histologic grading system: 3 grade system Residual tumor (R): R0 - None     INTERVAL HISTORY: Pt is here for a routine scheduled virtual follow up.  I have verified her identity.  She underwent left breast lumpectomy on June 06, 2022.  She tolerated surgery very well, denies any pain or other discomfort.  She lives independently, still goes out, and drive some time.   REVIEW OF SYSTEMS:   Constitutional: Denies fevers, chills or abnormal weight loss Eyes: Denies blurriness of vision Ears, nose, mouth, throat, and face: Denies mucositis or sore throat Respiratory: Denies cough, dyspnea or wheezes Cardiovascular: Denies palpitation, chest discomfort or lower extremity swelling Gastrointestinal:  Denies nausea, heartburn or change in bowel habits Skin: Denies abnormal skin rashes Lymphatics: Denies new lymphadenopathy or easy bruising Neurological:Denies numbness, tingling or new weaknesses Behavioral/Psych: Mood is stable, no new changes  All other systems were reviewed with the patient and are negative.  MEDICAL HISTORY:  Past Medical History:  Diagnosis Date   Anemia    Arrhythmia 02/06/2012   hx of paroxysmal a fib with no recurrence,rare palpations   Arthritis    Breast lump    Cervical cancer (HCC)    Dysrhythmia    GERD (gastroesophageal reflux disease)    History of hiatal hernia    History of kidney stones    Hx of migraine headaches    Macular degeneration    L eye   SCC (squamous cell carcinoma) 2013   right temple tx cx3 29f   SCC (squamous cell carcinoma) 2014   right upper shin scc/ka tx cx3 564f& Exc   Squamous cell carcinoma of skin 1999   left neck scc tx cx3 [id number] Thyroid disease     SURGICAL HISTORY: Past Surgical History:  Procedure Laterality Date   ABDOMINAL HYSTERECTOMY     BREAST  LUMPECTOMY WITH RADIOACTIVE SEED LOCALIZATION Left 06/06/2022   Procedure: LEFT BREAST BRACKETED LUMPECTOMY WITH RADIOACTIVE SEED LOCALIZATION;  Surgeon: CorErroll LunaD;  [facility]  Laterality: Left;   CHOLECYSTECTOMY     KYPHOPLASTY N/A 12/18/2018   Procedure: KYPHOPLASTY, THORACIC SEVEN;  Surgeon: JenNewman PiesD;  [facility];  Laterality: N/A;  KYPHOPLASTY, THORACIC 7   NM MYOCAR PERF WALL MOTION  02/19/2008   negative for ischemia   SUPRAVENTRICULAR TACHYCARDIA ABLATION     THYROIDECTOMY N/A 05/16/2021   Procedure: TOTAL THYROIDECTOMY;  Surgeon: GerArmandina GemmaD;  Location: [facility];  Service: General;  Laterality: N/A;    I have reviewed the social history and family history with the patient and they are unchanged from previous note.  ALLERGIES:  is allergic to nebivolol hcl, phenothiazines, pneumococcal vac polyvalent, sulfamethoxazole-trimethoprim, wound dressing adhesive, latex, metoprolol, and sulfa antibiotics.  MEDICATIONS:  Current Outpatient Medications  Medication Sig Dispense Refill   acetaminophen (TYLENOL) 500 MG tablet Take 500 mg by mouth every 8 (eight) hours as needed for headache.      Brimonidine Tartrate (LUMIFY) 0.025 % SOLN Place 1 drop into both eyes daily.     calcium carbonate (TUMS) 500 MG chewable tablet Chew 3 tablets by mouth 3 (three) times daily.     Cholecalciferol (VITAMIN D-3 PO) Take 10,000 Units by mouth every Friday.  diltiazem (CARDIZEM) 30 MG tablet Take 1 tablet (30 mg total) by mouth daily as needed. 30 tablet 3   diltiazem (DILACOR XR) 180 MG 24 hr capsule TAKE 1 CAPSULE EVERY DAY 90 capsule 3   iron polysaccharides (NIFEREX) 150 MG capsule Take 150 mg by mouth every Monday, Wednesday, and Friday.     levothyroxine (SYNTHROID) 75 MCG tablet Take 75 mcg by mouth daily before breakfast.     Multiple Vitamins-Minerals (PRESERVISION AREDS PO) Take 1 capsule by  mouth 2 (two) times daily.      omeprazole (PRILOSEC) 20 MG capsule Take 20 mg by mouth daily with lunch.      oxyCODONE (OXY IR/ROXICODONE) 5 MG immediate release tablet Take 1 tablet (5 mg total) by mouth every 6 (six) hours as needed for severe pain. 15 tablet 0   polyethylene glycol powder (GLYCOLAX/MIRALAX) 17 GM/SCOOP powder Take 17 g by mouth daily.     No current facility-administered medications for this visit.    PHYSICAL EXAMINATION: Not performed   LABORATORY DATA:  I have reviewed the data as listed    Latest Ref Rng & Units 05/10/2022   12:25 PM 04/28/2021    8:27 AM 12/17/2018    7:15 AM  CBC  WBC 4.0 - 10.5 K/uL 6.6  6.4  10.3   Hemoglobin 12.0 - 15.0 g/dL 14.5  14.7  14.0   Hematocrit 36.0 - 46.0 % 43.7  45.0  43.0   Platelets 150 - 400 K/uL 167  181  194         Latest Ref Rng & Units 05/10/2022   12:25 PM 05/17/2021    4:05 AM 04/28/2021    8:27 AM  CMP  Glucose 70 - 99 mg/dL 138  104  120   BUN 8 - 23 mg/dL _0 Creatinine 0.44 - 1.00 mg/dL 0.95  0.64  0.78   Sodium 135 - 145 mmol/L 140  136  141   Potassium 3.5 - 5.1 mmol/L 3.7  4.0  3.9   Chloride 98 - 111 mmol/L 105  105  105   CO2 22 - 32 mmol/L _1 Calcium 8.9 - 10.3 mg/dL 9.1  8.0  9.3   Total Protein 6.5 - 8.1 g/dL 6.7     Total Bilirubin 0.3 - 1.2 mg/dL 1.4     Alkaline Phos 38 - 126 U/L 66     AST 15 - 41 U/L 16     ALT 0 - 44 U/L 9         RADIOGRAPHIC STUDIES: I have personally reviewed the radiological images as listed and agreed with the findings in the report. No results found.     I discussed the assessment and treatment plan with the patient. The patient was provided an opportunity to ask questions and all were answered. The patient agreed with the plan and demonstrated an understanding of the instructions.   The patient was advised to call back or seek an in-person evaluation if the symptoms worsen or if the condition fails to improve as anticipated.  I  provided 15 minutes of non face-to-face telephone visit time during this encounter, and > 50% was spent counseling as documented under my assessment & plan.     Truitt Merle, MD 06/27/22

## 2022-06-26 NOTE — Assessment & Plan Note (Addendum)
grade 3, ER weakly+/PR- -found on screening mammogram -s/p lumpectomy on 06/06/2022.  I reviewed her surgical pathology results with her.  Margins were negative. -Given her advanced age, ER 10% weakly positive, PR negative, I do not recommend adjuvant antiestrogen therapy. -Patient is 89 year old, but is still active and functions well.  I encouraged her to continue annual mammogram. -Follow-up with [place] in 6 months, then as needed after.

## 2022-06-27 ENCOUNTER — Inpatient Hospital Stay: Payer: Medicare HMO | Attending: Hematology | Admitting: Hematology

## 2022-06-27 ENCOUNTER — Encounter: Payer: Self-pay | Admitting: Hematology

## 2022-06-27 DIAGNOSIS — D0512 Intraductal carcinoma in situ of left breast: Secondary | ICD-10-CM | POA: Diagnosis not present

## 2022-06-28 ENCOUNTER — Telehealth: Payer: Self-pay | Admitting: Hematology

## 2022-06-28 NOTE — Telephone Encounter (Signed)
Spoke with patient confirming 6 month follow up appointment

## 2022-06-29 ENCOUNTER — Encounter: Payer: Self-pay | Admitting: *Deleted

## 2022-06-29 DIAGNOSIS — D0512 Intraductal carcinoma in situ of left breast: Secondary | ICD-10-CM

## 2022-07-11 ENCOUNTER — Telehealth: Payer: Self-pay | Admitting: Radiation Oncology

## 2022-07-11 NOTE — Telephone Encounter (Signed)
I called the patient to let her know that Dr. Lisbeth Renshaw had reviewed her pathology report. While her margins are negative, they are close in the posterior, medial, and lateral margin. He would offer an ultrahypofractionated course of radiation. After discussing this, the patient was under the impression she did not need radiation, and is comfortable forgoing this at this time. She will not receive antiestrogen therapy given the weak positive testing. She will notify Dr. Burr Medico or Dr. Brantley Stage, or call us directly if she desires further discussion about radiation. Our call dropped and I called back and we were able to discuss possible hypofractionated versus ultrahypofractionated therapy. She would like to think about her options and let me know, but for now is not planning on any therapy at this time.

## 2022-07-13 ENCOUNTER — Encounter (HOSPITAL_COMMUNITY): Payer: Self-pay

## 2022-07-17 ENCOUNTER — Telehealth: Payer: Self-pay | Admitting: Radiation Oncology

## 2022-07-17 NOTE — Telephone Encounter (Signed)
I received a call from the patient's son Mr. Kenton Kingfisher. After discussing her case as a family, they have decided that radiation is not something she is interested in. We will be available as needed moving forward.

## 2022-08-30 NOTE — Progress Notes (Signed)
Cardiology Clinic Note   Date: 09/01/2022 [name], DOB 28-May-1927, MRN [id number]  Primary Cardiologist:  Sanda Klein, MD  Patient Profile    Jill Mcdowell is a 89 y.o. female who presents to the clinic today for routine follow-up.   Past medical history significant for: Palpitations/PVCs/PAF. Near syncope. Echo 12/09/2021: EF 60 to 65%.  Mild MR.  Aortic valve sclerosis without stenosis. Hypothyroidism. Ductal carcinoma left breast.  S/p lumpectomy and partial mastectomy December 2023. Declined radiation.    History of Present Illness    Jill Mcdowell was first evaluated by Dr. Sallyanne Kuster on 02/26/2013 for palpitations/A-fib after her previous cardiologist Dr. Rex Kras retired.  PAF diagnosis added to patient's chart over 20 years ago but has not been confirmed with EKG tracing.  Patient is allergic to adhesive and cannot tolerate event monitoring secondary to breaking out in a blistering rash.  She was last seen in the office by Dr. Sallyanne Kuster on 11/18/2021 with complaints of increased palpitations.  She described episodes of 9-20 rapid beats of palpitations that feel regular and rarely sustained beyond a few seconds.  She was checking her heart rate and max rate was 140 bpm in which she had transient chest tightness that was relieved with a cold rag to her forehead and when palpitations stopped. On the limited event monitoring that has been obtained in the past, palpitations correlated with monomorphic PVCs.  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  As previously mentioned secondary to adhesive allergy she has been unable to undergo event monitoring.  Patient was provided with as needed diltiazem for periods of increased palpitations.  Today, patient reports she is doing well. She still feels occasional palpitations. They are unchanged from her last visit. She does not typically find them too bothersome. She had  one isolated episode of increased HR so she took an extra diltiazem and it resolved. She cannot recall when this was but feels it was after her May visit with Dr. Sallyanne Kuster. She reports less palpitations this week but noticed them last night and this AM. Will get an EKG to try to catch palpitations. Her granddaughter moved back to [place] so she does not have access to an Apple watch and she has not obtained a Paramedic. Patient denies shortness of breath or dyspnea on exertion. No chest pain, pressure, or tightness. Denies lower extremity edema, orthopnea, or PND. She is relatively sedentary. She states she should participate in the low impact exercise classes offered at the assisted living facility but she prefers to watch old movies on TV. She reports she eats well and tries to stay hydrated.   ROS: All other systems reviewed and are otherwise negative except as noted in History of Present Illness.  Studies Reviewed    ECG personally reviewed by me today: NSR, rate 82 bpm.  No significant changes from 11/18/2021.   Physical Exam    VS:  BP 110/68   Pulse 85   Ht 5' (1.524 m)   Wt 116 lb 3.2 oz (52.7 kg)   SpO2 94%   BMI 22.69 kg/m  , BMI Body mass index is 22.69 kg/m.  GEN: Well nourished, well developed, in no acute distress. Neck: No JVD or carotid bruits. Cardiac: RRR. No murmurs. No rubs or gallops.   Respiratory:  Respirations regular and unlabored. Clear to auscultation without rales, wheezing or rhonchi. GI: Soft, nontender, nondistended. Extremities: Radials/DP/PT 2+ and equal bilaterally. No  clubbing or cyanosis. No edema.  Skin: Warm and dry, no rash. Neuro: Strength intact.  Assessment & Plan   Palpitations/PVCs.  Patient has not undergone event monitoring secondary to adhesive allergy causing blistering rash.  Patient reports palpitations essentially unchanged from last visit May 2023. She had one episode since May of increased HR for which she took an extra dose of  diltiazem. That is the only time this occurred. She  reports palpitations while in office. EKG showed sinus rhythm without PVCs. Encouraged patient to obtain a [place] to be able to catch palpitations at home. Continue daily diltiazem with breakthrough diltiazem as needed.  PAF-remote history.  Diagnosis of PAF was put in patient's chart over 20 years ago but has not been confirmed with EKG tracing.  Patient's CHA2DS2-VASc 3 secondary to age and gender.  Not on anticoagulation.  Patient reports palpitations feel fast but regular. Patient denies episodes of irregular heart rhythm. She reports one episode of increased heart rate that did not feel irregular. Continue aspirin.  Disposition: Return for previously scheduled office visit with Dr. Sallyanne Kuster or sooner as needed.          Signed, Justice Britain. Chanteria Haggard, DNP, NP-C

## 2022-09-01 ENCOUNTER — Encounter: Payer: Self-pay | Admitting: Student

## 2022-09-01 ENCOUNTER — Ambulatory Visit: Payer: Medicare HMO | Attending: Student | Admitting: Student

## 2022-09-01 VITALS — BP 110/68 | HR 85 | Ht 60.0 in | Wt 116.2 lb

## 2022-09-01 DIAGNOSIS — I493 Ventricular premature depolarization: Secondary | ICD-10-CM | POA: Diagnosis not present

## 2022-09-01 DIAGNOSIS — R002 Palpitations: Secondary | ICD-10-CM | POA: Diagnosis not present

## 2022-09-01 NOTE — Patient Instructions (Signed)
Medication Instructions:  Your physician recommends that you continue on your current medications as directed. Please refer to the Current Medication list given to you today.  *If you need a refill on your cardiac medications before your next appointment, please call your pharmacy*   Lab Work: NONE If you have labs (blood work) drawn today and your tests are completely normal, you will receive your results only by: [place] (if you have MyChart) OR A paper copy in the mail If you have any lab test that is abnormal or we need to change your treatment, we will call you to review the results.   Testing/Procedures: NONE   Follow-Up: At [facility], you and your health needs are our priority.  As part of our continuing mission to provide you with exceptional heart care, we have created designated Provider Care Teams.  These Care Teams include your primary Cardiologist (physician) and Advanced Practice Providers (APPs -  Physician Assistants and Nurse Practitioners) who all work together to provide you with the care you need, when you need it.  We recommend signing up for the patient portal called "MyChart".  Sign up information is provided on this After Visit Summary.  MyChart is used to connect with patients for Virtual Visits (Telemedicine).  Patients are able to view lab/test results, encounter notes, upcoming appointments, etc.  Non-urgent messages can be sent to your provider as well.   To learn more about what you can do with MyChart, go to [email or web address].    Your next appointment:   Please keep upcoming appointment with Dr. Loletha Grayer

## 2022-09-19 ENCOUNTER — Other Ambulatory Visit: Payer: Self-pay | Admitting: *Deleted

## 2022-09-19 NOTE — Telephone Encounter (Signed)
Defer to PCP

## 2022-09-26 ENCOUNTER — Other Ambulatory Visit: Payer: Self-pay | Admitting: Cardiovascular Disease

## 2022-09-26 NOTE — Telephone Encounter (Signed)
*  STAT* If patient is at the pharmacy, call can be transferred to refill team.   1. Which medications need to be refilled? (please list name of each medication and dose if known)   iron polysaccharides (NIFEREX) 150 MG capsule   2. Which pharmacy/location (including street and city if local pharmacy) is medication to be sent to?  [place], [place]   3. Do they need a 30 day or 90 day supply?   90 day  Caller was unsure if patient still has medication left.

## 2022-09-26 NOTE — Telephone Encounter (Signed)
Called and informed pt to call PCP for refill, verbalized understanding

## 2022-09-28 ENCOUNTER — Other Ambulatory Visit: Payer: Self-pay

## 2022-09-28 MED ORDER — POLYSACCHARIDE IRON COMPLEX 150 MG PO CAPS: 150.0000 mg | ORAL_CAPSULE | ORAL | 2 refills | Status: DC

## 2022-10-05 DIAGNOSIS — K219 Gastro-esophageal reflux disease without esophagitis: Secondary | ICD-10-CM | POA: Diagnosis not present

## 2022-10-05 DIAGNOSIS — Z23 Encounter for immunization: Secondary | ICD-10-CM | POA: Diagnosis not present

## 2022-10-05 DIAGNOSIS — N1831 Chronic kidney disease, stage 3a: Secondary | ICD-10-CM | POA: Diagnosis not present

## 2022-10-05 DIAGNOSIS — Z79899 Other long term (current) drug therapy: Secondary | ICD-10-CM | POA: Diagnosis not present

## 2022-10-05 DIAGNOSIS — R053 Chronic cough: Secondary | ICD-10-CM | POA: Diagnosis not present

## 2022-10-05 DIAGNOSIS — E890 Postprocedural hypothyroidism: Secondary | ICD-10-CM | POA: Diagnosis not present

## 2022-10-05 DIAGNOSIS — E559 Vitamin D deficiency, unspecified: Secondary | ICD-10-CM | POA: Diagnosis not present

## 2022-10-05 DIAGNOSIS — D509 Iron deficiency anemia, unspecified: Secondary | ICD-10-CM | POA: Diagnosis not present

## 2022-10-05 DIAGNOSIS — I472 Ventricular tachycardia, unspecified: Secondary | ICD-10-CM | POA: Diagnosis not present

## 2022-10-05 DIAGNOSIS — H9193 Unspecified hearing loss, bilateral: Secondary | ICD-10-CM | POA: Diagnosis not present

## 2022-10-05 DIAGNOSIS — M810 Age-related osteoporosis without current pathological fracture: Secondary | ICD-10-CM | POA: Diagnosis not present

## 2022-10-19 DIAGNOSIS — E538 Deficiency of other specified B group vitamins: Secondary | ICD-10-CM | POA: Diagnosis not present

## 2022-10-20 DIAGNOSIS — E890 Postprocedural hypothyroidism: Secondary | ICD-10-CM | POA: Diagnosis not present

## 2022-10-24 DIAGNOSIS — E890 Postprocedural hypothyroidism: Secondary | ICD-10-CM | POA: Diagnosis not present

## 2022-10-24 DIAGNOSIS — I480 Paroxysmal atrial fibrillation: Secondary | ICD-10-CM | POA: Diagnosis not present

## 2022-10-24 DIAGNOSIS — C73 Malignant neoplasm of thyroid gland: Secondary | ICD-10-CM | POA: Diagnosis not present

## 2022-10-26 DIAGNOSIS — E538 Deficiency of other specified B group vitamins: Secondary | ICD-10-CM | POA: Diagnosis not present

## 2022-11-02 DIAGNOSIS — E538 Deficiency of other specified B group vitamins: Secondary | ICD-10-CM | POA: Diagnosis not present

## 2022-11-09 DIAGNOSIS — E538 Deficiency of other specified B group vitamins: Secondary | ICD-10-CM | POA: Diagnosis not present

## 2022-11-23 DIAGNOSIS — H52203 Unspecified astigmatism, bilateral: Secondary | ICD-10-CM | POA: Diagnosis not present

## 2022-11-23 DIAGNOSIS — Z961 Presence of intraocular lens: Secondary | ICD-10-CM | POA: Diagnosis not present

## 2022-11-23 DIAGNOSIS — H26492 Other secondary cataract, left eye: Secondary | ICD-10-CM | POA: Diagnosis not present

## 2022-11-23 DIAGNOSIS — H353132 Nonexudative age-related macular degeneration, bilateral, intermediate dry stage: Secondary | ICD-10-CM | POA: Diagnosis not present

## 2022-11-24 DIAGNOSIS — Z6823 Body mass index (BMI) 23.0-23.9, adult: Secondary | ICD-10-CM | POA: Diagnosis not present

## 2022-11-24 DIAGNOSIS — Z1389 Encounter for screening for other disorder: Secondary | ICD-10-CM | POA: Diagnosis not present

## 2022-11-24 DIAGNOSIS — E538 Deficiency of other specified B group vitamins: Secondary | ICD-10-CM | POA: Diagnosis not present

## 2022-11-24 DIAGNOSIS — Z0000 Encounter for general adult medical examination without abnormal findings: Secondary | ICD-10-CM | POA: Diagnosis not present

## 2022-12-05 DIAGNOSIS — C73 Malignant neoplasm of thyroid gland: Secondary | ICD-10-CM | POA: Diagnosis not present

## 2022-12-06 DIAGNOSIS — H35453 Secondary pigmentary degeneration, bilateral: Secondary | ICD-10-CM | POA: Diagnosis not present

## 2022-12-06 DIAGNOSIS — Z961 Presence of intraocular lens: Secondary | ICD-10-CM | POA: Diagnosis not present

## 2022-12-06 DIAGNOSIS — H353124 Nonexudative age-related macular degeneration, left eye, advanced atrophic with subfoveal involvement: Secondary | ICD-10-CM | POA: Diagnosis not present

## 2022-12-06 DIAGNOSIS — H353112 Nonexudative age-related macular degeneration, right eye, intermediate dry stage: Secondary | ICD-10-CM | POA: Diagnosis not present

## 2022-12-06 DIAGNOSIS — H35363 Drusen (degenerative) of macula, bilateral: Secondary | ICD-10-CM | POA: Diagnosis not present

## 2022-12-22 ENCOUNTER — Telehealth: Payer: Self-pay | Admitting: Hematology

## 2022-12-22 NOTE — Telephone Encounter (Signed)
Contacted patient to scheduled appointments. Left message with appointment details and a call back number if patient had any questions or could not accommodate the time we provided.   

## 2022-12-27 ENCOUNTER — Ambulatory Visit: Payer: Medicare HMO | Admitting: Hematology

## 2022-12-30 ENCOUNTER — Other Ambulatory Visit: Payer: Self-pay | Admitting: Cardiovascular Disease

## 2023-01-04 DIAGNOSIS — J069 Acute upper respiratory infection, unspecified: Secondary | ICD-10-CM | POA: Diagnosis not present

## 2023-01-04 DIAGNOSIS — Z6823 Body mass index (BMI) 23.0-23.9, adult: Secondary | ICD-10-CM | POA: Diagnosis not present

## 2023-01-14 NOTE — Progress Notes (Signed)
Patient Care Team: Rankins, Fanny Dance, MD as PCP - General (Family Medicine) Croitoru, Rachelle Hora, MD as PCP - Cardiology (Cardiology) Janalyn Harder, MD (Inactive) as Consulting Physician (Dermatology) Pershing Proud, RN as Oncology Nurse Navigator Donnelly Angelica, RN as Oncology Nurse Navigator Malachy Mood, MD as Consulting Physician (Hematology) Dorothy Puffer, MD as Consulting Physician (Radiation Oncology) Harriette Bouillon, MD as Consulting Physician (General Surgery)   CHIEF COMPLAINT: Follow up left breast DCIS   Oncology History  Ductal carcinoma in situ (DCIS) of left breast  05/03/2022 Cancer Staging   Staging form: Breast, AJCC 8th Edition - Clinical stage from 05/03/2022: Stage 0 (cTis (DCIS), cN0, cM0, G3, ER+, PR-, HER2: Not Assessed) - Signed by Malachy Mood, MD on 05/09/2022 Stage prefix: Initial diagnosis Histologic grading system: 3 grade system   05/03/2022 Initial Biopsy   Diagnosis Breast, left, needle core biopsy, UOQ - DUCTAL CARCINOMA IN SITU (DCIS), GRADE 2-3 (INTERMEDIATE-HIGH). - MICROCALCIFICATIONS PRESENT.  PROGNOSTIC INDICATORS Results: IMMUNOHISTOCHEMICAL AND MORPHOMETRIC ANALYSIS PERFORMED MANUALLY Estrogen Receptor: 10%, POSITIVE, WEAK STAINING INTENSITY Progesterone Receptor: 0%, NEGATIVE COMMENT: The negative hormone receptor study(ies) in this case has an internal positive control. The cancer in this sample has a low level (1%-10%) of ER expression by IHC. There are limited data on the overall benefit of endocrine therapies for patients with low level (1%-10%) ER expression, but they currently suggest possible benefit, so patients are considered eligible for endocrine treatment. There are data that suggest invasive cancers with these results are heterogeneous in both behavior and biology and often have gene expression profiles more similar to ER-negative cancers.   05/08/2022 Initial Diagnosis   Ductal carcinoma in situ (DCIS) of left breast    Genetic  Testing   [facility] CancerNext Panel+RNA was Negative. Of note, a variant of uncertain significance was detected in the CDH1 gene (p.[id number]). Report date is 06/01/2022.  The CancerNext gene panel offered by [employer] includes sequencing, rearrangement analysis, and RNA analysis for the following 36 genes:   APC, ATM, AXIN2, BARD1, BMPR1A, BRCA1, BRCA2, BRIP1, CDH1, CDK4, CDKN2A, CHEK2, DICER1, HOXB13, EPCAM, GREM1, MLH1, MSH2, MSH3, MSH6, MUTYH, NBN, NF1, NTHL1, PALB2, PMS2, POLD1, POLE, PTEN, RAD51C, RAD51D, RECQL, SMAD4, SMARCA4, STK11, and TP53.    06/06/2022 Cancer Staging   Staging form: Breast, AJCC 8th Edition - Pathologic stage from 06/06/2022: Stage Unknown (pTis (DCIS), pNX, cM0, G3, ER+, PR-, HER2: Not Assessed) - Signed by Malachy Mood, MD on 06/26/2022 Stage prefix: Initial diagnosis Histologic grading system: 3 grade system Residual tumor (R): R0 - None      CURRENT THERAPY: Surveillance   INTERVAL HISTORY Jill Mcdowell returns for follow up as scheduled. Last seen by Dr. Mosetta Putt 06/27/22. She is doing well in the interim, without changes in her overall health. Remains healthy and active. She has had a right breast cyst for decades, denies recent changes or new lump/mass, nipple discharge/inversion, or skin change.   ROS  All other systems reviewed and negative   Past Medical History:  Diagnosis Date   Anemia    Arrhythmia 02/06/2012   hx of paroxysmal a fib with no recurrence,rare palpations   Arthritis    Breast lump    Cervical cancer (HCC)    Dysrhythmia    GERD (gastroesophageal reflux disease)    History of hiatal hernia    History of kidney stones    Hx of migraine headaches    Macular degeneration    L eye   SCC (squamous cell carcinoma) 2013  right temple tx cx3 71fu   SCC (squamous cell carcinoma) 2014   right upper shin scc/ka tx cx3 17fu & Exc   Squamous cell carcinoma of skin 1999   left neck scc tx cx3 14fu   Thyroid disease      Past Surgical History:   Procedure Laterality Date   ABDOMINAL HYSTERECTOMY     BREAST LUMPECTOMY WITH RADIOACTIVE SEED LOCALIZATION Left 06/06/2022   Procedure: LEFT BREAST BRACKETED LUMPECTOMY WITH RADIOACTIVE SEED LOCALIZATION;  Surgeon: Harriette Bouillon, MD;  Location: [facility];  Service: General;  Laterality: Left;   CHOLECYSTECTOMY     KYPHOPLASTY N/A 12/18/2018   Procedure: KYPHOPLASTY, THORACIC SEVEN;  Surgeon: Tressie Stalker, MD;  Location: [facility] OR;  Service: Neurosurgery;  Laterality: N/A;  KYPHOPLASTY, THORACIC 7   NM MYOCAR PERF WALL MOTION  02/19/2008   negative for ischemia   SUPRAVENTRICULAR TACHYCARDIA ABLATION     THYROIDECTOMY N/A 05/16/2021   Procedure: TOTAL THYROIDECTOMY;  Surgeon: Darnell Level, MD;  Location: [facility];  Service: General;  Laterality: N/A;     Outpatient Encounter Medications as of 01/15/2023  Medication Sig   acetaminophen (TYLENOL) 500 MG tablet Take 500 mg by mouth every 8 (eight) hours as needed for headache.    Brimonidine Tartrate (LUMIFY) 0.025 % SOLN Place 1 drop into both eyes daily.   calcium carbonate (TUMS) 500 MG chewable tablet Chew 3 tablets by mouth 3 (three) times daily.   Cholecalciferol (VITAMIN D-3 PO) Take 10,000 Units by mouth every Friday.    diltiazem (CARDIZEM) 30 MG tablet Take 1 tablet (30 mg total) by mouth daily as needed.   diltiazem (DILACOR XR) 180 MG 24 hr capsule TAKE 1 CAPSULE EVERY DAY   iron polysaccharides (NIFEREX) 150 MG capsule Take 1 capsule (150 mg total) by mouth every Monday, Wednesday, and Friday.   levothyroxine (SYNTHROID) 75 MCG tablet Take 75 mcg by mouth daily before breakfast.   Multiple Vitamins-Minerals (PRESERVISION AREDS PO) Take 1 capsule by mouth 2 (two) times daily.    omeprazole (PRILOSEC) 20 MG capsule Take 20 mg by mouth daily with lunch.    polyethylene glycol powder (GLYCOLAX/MIRALAX) 17 GM/SCOOP powder Take 17 g by mouth daily.   No facility-administered encounter medications on file as of  01/15/2023.     Today's Vitals   01/15/23 1058  BP: 117/71  Pulse: 86  Resp: 18  Temp: 98.1 F (36.7 C)  SpO2: 95%  Weight: 113 lb 9.6 oz (51.5 kg)   Body mass index is 22.19 kg/m.   PHYSICAL EXAM GENERAL:alert, no distress and comfortable SKIN: no rash  EYES: sclera clear NECK: without mass LYMPH:  no palpable cervical or supraclavicular lymphadenopathy  LUNGS: normal breathing effort HEART:  no lower extremity edema ABDOMEN: abdomen soft, non-tender and normal bowel sounds NEURO: alert & oriented x 3 with fluent speech, no focal motor/sensory deficits Breast exam: no nipple discharge or inversion. S/p left lumpectomy, incision completely healed. Soft tissue density R breast 6:00 c/w known cyst. No other palpable mass or nodularity in either breast or axilla that I could appreciate   CBC    Component Value Date/Time   WBC 6.6 05/10/2022 1225   WBC 6.4 04/28/2021 0827   RBC 4.73 05/10/2022 1225   HGB 14.5 05/10/2022 1225   HCT 43.7 05/10/2022 1225   PLT 167 05/10/2022 1225   MCV 92.4 05/10/2022 1225   MCH 30.7 05/10/2022 1225   MCHC 33.2 05/10/2022 1225   RDW 13.0 05/10/2022 1225  LYMPHSABS 1.5 05/10/2022 1225   MONOABS 0.4 05/10/2022 1225   EOSABS 0.1 05/10/2022 1225   BASOSABS 0.0 05/10/2022 1225     CMP     Component Value Date/Time   NA 140 05/10/2022 1225   K 3.7 05/10/2022 1225   CL 105 05/10/2022 1225   CO2 29 05/10/2022 1225   GLUCOSE 138 (H) 05/10/2022 1225   BUN 13 05/10/2022 1225   CREATININE 0.95 05/10/2022 1225   CALCIUM 9.1 05/10/2022 1225   PROT 6.7 05/10/2022 1225   ALBUMIN 4.2 05/10/2022 1225   AST 16 05/10/2022 1225   ALT 9 05/10/2022 1225   ALKPHOS 66 05/10/2022 1225   BILITOT 1.4 (H) 05/10/2022 1225   GFRNONAA 56 (L) 05/10/2022 1225   GFRAA >60 12/17/2018 0715     ASSESSMENT & PLAN: 89 yo female   Ductal carcinoma in situ (DCIS) of left breast, grade 3, ER weakly+/PR- -found on screening mammogram -s/p lumpectomy on  06/06/2022, margins were negative. -Given her advanced age, ER 10% weakly positive, PR negative, adjuvant antiestrogen therapy was not recommended. -Jill Mcdowell is clinically doing well. Exam shows known right breast cyst (bx 09/2012), otherwise unremarkable. No clinical concern for recurrence.  -Mammogram scheduled in October at [facility]. Continue breast cancer surveillance -F/up in 1 year, or sooner if needed    PLAN: -Continue breast cancer surveillance -Mammogram in October at [facility] -Previous records reviewed to confirm R breast cyst  -F/up in 1 year, or sooner if needed    All questions were answered. The patient knows to call the clinic with any problems, questions or concerns. No barriers to learning were detected.   Santiago Glad, NP-C 01/15/2023

## 2023-01-15 ENCOUNTER — Encounter: Payer: Self-pay | Admitting: Nurse Practitioner

## 2023-01-15 ENCOUNTER — Inpatient Hospital Stay: Payer: Medicare HMO | Attending: Nurse Practitioner | Admitting: Nurse Practitioner

## 2023-01-15 ENCOUNTER — Other Ambulatory Visit: Payer: Self-pay

## 2023-01-15 VITALS — BP 117/71 | HR 86 | Temp 98.1°F | Resp 18 | Wt 113.6 lb

## 2023-01-15 DIAGNOSIS — D0512 Intraductal carcinoma in situ of left breast: Secondary | ICD-10-CM | POA: Diagnosis not present

## 2023-01-15 DIAGNOSIS — Z86000 Personal history of in-situ neoplasm of breast: Secondary | ICD-10-CM | POA: Insufficient documentation

## 2023-01-17 ENCOUNTER — Ambulatory Visit: Payer: Medicare HMO | Admitting: Cardiovascular Disease

## 2023-01-24 ENCOUNTER — Encounter: Payer: Self-pay | Admitting: Cardiovascular Disease

## 2023-01-24 ENCOUNTER — Ambulatory Visit: Payer: Medicare HMO | Attending: Cardiovascular Disease | Admitting: Cardiovascular Disease

## 2023-01-24 VITALS — BP 108/74 | HR 87 | Ht 59.0 in | Wt 114.4 lb

## 2023-01-24 DIAGNOSIS — I480 Paroxysmal atrial fibrillation: Secondary | ICD-10-CM | POA: Diagnosis not present

## 2023-01-24 DIAGNOSIS — E039 Hypothyroidism, unspecified: Secondary | ICD-10-CM

## 2023-01-24 DIAGNOSIS — I493 Ventricular premature depolarization: Secondary | ICD-10-CM | POA: Diagnosis not present

## 2023-01-24 NOTE — Patient Instructions (Signed)

## 2023-01-24 NOTE — Progress Notes (Signed)
Patient had [facility] with cardiology   Cardiology Office Note    Date:  01/24/2023   ID:  Jill Mcdowell, DOB 1927/06/21, MRN [id number]  PCP:  Clayborn Heron, MD  Cardiologist:   Thurmon Fair, MD   Chief Complaint  Patient presents with   Palpitations    History of Present Illness:  Jill Mcdowell is a 89 y.o. female a remote history of a single episode of atrial fibrillation (reportedly occurred in the 1990s, no firm documentation available).   It's been 4 years since her husband Annette Stable passed away.  She continues to live at [facility].  She is here today with her daughter, Burna Mortimer.  She has not been bothered by palpitations almost at all.  She has only taken diltiazem once in the ER since her last appointment.  She is having occasional ectopic beats during her physical exam today but is unaware of them.  She has not had any other cardiovascular complaints.  She does have varicose veins of the lower extremities, but does not have any new skin ulcerations.  She has some discomfort in her right knee and a sensation of a band constricting her right ankle but this is present both at rest and with activity.  It does not sound like claudication.  She has normal pulses.  She is quite sedentary but denies problems with exertional angina or dyspnea.  She does not have orthopnea, PND, edema.  In the past, her palpitations seem to be associated with monomorphic PVCs.  These always had a right bundle branch block pattern.  She did not tolerate beta-blockers due to visual complaints.  She had hallucinations consistent with Jacqulyn Liner syndrome (she would sometimes see a small female figure and other times a stereotypical geometrical pattern).  She is very allergic to adhesives.  Past attempts at getting rhythm monitor tracings were always impeded by blistering rashes.  This also happened when she received a lidocaine patch after back surgery.  She does not have known CAD.  She had a normal  nuclear scan in 2009.  Past Medical History:  Diagnosis Date   Anemia    Arrhythmia 02/06/2012   hx of paroxysmal a fib with no recurrence,rare palpations   Arthritis    Breast lump    Cervical cancer (HCC)    Dysrhythmia    GERD (gastroesophageal reflux disease)    History of hiatal hernia    History of kidney stones    Hx of migraine headaches    Macular degeneration    L eye   SCC (squamous cell carcinoma) 2013   right temple tx cx3 22fu   SCC (squamous cell carcinoma) 2014   right upper shin scc/ka tx cx3 68fu & Exc   Squamous cell carcinoma of skin 1999   left neck scc tx cx3 11fu   Thyroid disease     Past Surgical History:  Procedure Laterality Date   ABDOMINAL HYSTERECTOMY     BREAST LUMPECTOMY WITH RADIOACTIVE SEED LOCALIZATION Left 06/06/2022   Procedure: LEFT BREAST BRACKETED LUMPECTOMY WITH RADIOACTIVE SEED LOCALIZATION;  Surgeon: Harriette Bouillon, MD;  Location: [facility];  Service: General;  Laterality: Left;   CHOLECYSTECTOMY     KYPHOPLASTY N/A 12/18/2018   Procedure: KYPHOPLASTY, THORACIC SEVEN;  Surgeon: Tressie Stalker, MD;  Location: [facility] OR;  Service: Neurosurgery;  Laterality: N/A;  KYPHOPLASTY, THORACIC 7   NM MYOCAR PERF WALL MOTION  02/19/2008   negative for ischemia   SUPRAVENTRICULAR TACHYCARDIA ABLATION  THYROIDECTOMY N/A 05/16/2021   Procedure: TOTAL THYROIDECTOMY;  Surgeon: Darnell Level, MD;  Location: [facility];  Service: General;  Laterality: N/A;    Current Medications: Outpatient Medications Prior to Visit  Medication Sig Dispense Refill   acetaminophen (TYLENOL) 500 MG tablet Take 500 mg by mouth every 8 (eight) hours as needed for headache.      Brimonidine Tartrate (LUMIFY) 0.025 % SOLN Place 1 drop into both eyes daily.     calcium carbonate (TUMS) 500 MG chewable tablet Chew 3 tablets by mouth 3 (three) times daily.     Cholecalciferol (VITAMIN D-3 PO) Take 10,000 Units by mouth every Friday.      diltiazem (DILACOR XR)  180 MG 24 hr capsule TAKE 1 CAPSULE EVERY DAY 90 capsule 3   iron polysaccharides (NIFEREX) 150 MG capsule Take 1 capsule (150 mg total) by mouth every Monday, Wednesday, and Friday. 36 capsule 2   levothyroxine (SYNTHROID) 88 MCG tablet Take 88 mcg by mouth daily before breakfast.     Multiple Vitamins-Minerals (PRESERVISION AREDS PO) Take 1 capsule by mouth 2 (two) times daily.      omeprazole (PRILOSEC) 20 MG capsule Take 20 mg by mouth daily with lunch.      polyethylene glycol powder (GLYCOLAX/MIRALAX) 17 GM/SCOOP powder Take 17 g by mouth daily.     diltiazem (CARDIZEM) 30 MG tablet Take 1 tablet (30 mg total) by mouth daily as needed. (Patient not taking: Reported on 01/24/2023) 30 tablet 3   levothyroxine (SYNTHROID) 75 MCG tablet Take 75 mcg by mouth daily before breakfast. (Patient not taking: Reported on 01/24/2023)     No facility-administered medications prior to visit.     Allergies:   Nebivolol hcl, Phenothiazines, Pneumococcal vac polyvalent, Sulfamethoxazole-trimethoprim, Wound dressing adhesive, Latex, Metoprolol, and Sulfa antibiotics   Social History   Socioeconomic History   Marital status: Married    Spouse name: Not on file   Number of children: 2   Years of education: Not on file   Highest education level: Not on file  Occupational History   Not on file  Tobacco Use   Smoking status: Former    Current packs/day: 0.00    Average packs/day: 0.5 packs/day for 40.0 years (20.0 ttl pk-yrs)    Types: Cigarettes    Start date: 40    Quit date: 31    Years since quitting: 34.6   Smokeless tobacco: Never  Vaping Use   Vaping status: Never Used  Substance and Sexual Activity   Alcohol use: No    Alcohol/week: 0.0 standard drinks of alcohol   Drug use: No   Sexual activity: Not on file  Other Topics Concern   Not on file  Social History Narrative   Not on file   Social Determinants of Health   Financial Resource Strain: Not on file  Food Insecurity: Not  on file  Transportation Needs: Not on file  Physical Activity: Not on file  Stress: Not on file  Social Connections: Not on file     Family History:  The patient's family history includes Colon cancer (age of onset: 28) in her brother; Lung cancer in her brother; Ovarian cancer in her sister; Pancreatic cancer in her mother; Stroke in her father.   ROS:   Please see the history of present illness.    ROS All other systems are reviewed and are negative.  PHYSICAL EXAM:   VS:  BP 108/74 (BP Location: Left Arm, Patient Position: Sitting, Cuff Size: Normal)  Pulse 87   Ht 4\' 11"  (1.499 m)   Wt 114 lb 6.4 oz (51.9 kg)   SpO2 95%   BMI 23.11 kg/m       General: Alert, oriented x3, no distress, very lean. Head: no evidence of trauma, PERRL, EOMI, no exophtalmos or lid lag, no myxedema, no xanthelasma; normal ears, nose and oropharynx Neck: normal jugular venous pulsations and no hepatojugular reflux; brisk carotid pulses without delay and no carotid bruits Chest: clear to auscultation, no signs of consolidation by percussion or palpation, normal fremitus, symmetrical and full respiratory excursions Cardiovascular: normal position and quality of the apical impulse, regular rhythm with occasional ectopic beats, normal first and second heart sounds, no murmurs, rubs or gallops Abdomen: no tenderness or distention, no masses by palpation, no abnormal pulsatility or arterial bruits, normal bowel sounds, no hepatosplenomegaly Extremities: no clubbing, cyanosis or edema; 2+ radial, ulnar and brachial pulses bilaterally; 2+ right femoral, posterior tibial and dorsalis pedis pulses; 2+ left femoral, posterior tibial and dorsalis pedis pulses; no subclavian or femoral bruits Neurological: grossly nonfocal Psych: Normal mood and affect    Wt Readings from Last 3 Encounters:  01/24/23 114 lb 6.4 oz (51.9 kg)  01/15/23 113 lb 9.6 oz (51.5 kg)  09/01/22 116 lb 3.2 oz (52.7 kg)       Studies/Labs Reviewed:   EKG:  EKG is not ordered today.  Personally reviewed the tracing from 09/01/2022 which shows normal sinus rhythm  Recent Labs: October 23, 2017 hemoglobin 14, normal liver function tests, TSH 0.76 Total cholesterol 206, HDL 57, LDL 124, triglycerides 123. 06/18/2019 Hemoglobin 14.1, normal liver function tests, TSH 1.14 Total cholesterol 177, HDL 61, LDL 107, triglycerides 102 05/27/2020 Cholesterol 198, HDL 65, LDL 117, triglycerides 91 Hemoglobin 15.4, creatinine 0.86, potassium 4.1, normal liver function tests, TSH 1.21 11/07/2021 Cholesterol 207, HDL 60, LDL 125, triglycerides 128 Creatinine 0.9, potassium 4.5, ALT 11, TSH 3.25 10/05/2022 Hemoglobin 14.9, creatinine 0.7, potassium 4.4, TSH 4.07  ASSESSMENT:    1. Paroxysmal atrial fibrillation (HCC)   2. PVC's (premature ventricular contractions)   3. Acquired hypothyroidism      PLAN:  In order of problems listed above:  PAFib: Questionable diagnosis.  We have never had firm documentation of this.  Somebody put this diagnosis in her chart over 20 years ago but I was never able to find any ECG tracing that confirmed this.  It would be important to exclude atrial fibrillation in this 89 year old, but previous attempts at long-term arrhythmia recording have failed since she promptly develops a blistering rash in response to all the ECG of the symptoms.  She does not have a smart phone or tablet.  Her granddaughter does have a smart watch with ECG capabilities.  I have asked her to use it when Margarine has palpitations and send [place] a copy of the tracings. PVCs: Having some ectopic beats in clinic today, but she is unaware of them at this time.  On the limited event monitoring that we have obtained in the past, her palpitations correlated with monomorphic PVCs.  She did not tolerate beta-blockers.  Diltiazem appeared to offer some benefit symptomatic benefit.  Her blood pressure is relatively low and she may  not tolerate higher daily doses of diltiazem, but I gave her a prescription for "as needed" diltiazem to take for periods of increased palpitations.   Macular degeneration: In response to beta-blocker she developed Jacqulyn Liner hallucinations related to macular degeneration;  retina specialist is Dr. Allena Katz. Thoracic  kyphosis/osteoporosis:   Has history of wedge compression fracture.   Hypothyroidism: She appears to be euthyroid both clinically and based on recent TSH.      Medication Adjustments/Labs and Tests Ordered: Current medicines are reviewed at length with the patient today.  Concerns regarding medicines are outlined above.  Medication changes, Labs and Tests ordered today are listed in the Patient Instructions below. Patient Instructions  Medication Instructions:  No changes *If you need a refill on your cardiac medications before your next appointment, please call your pharmacy*  Follow-Up: At [facility], you and your health needs are our priority.  As part of our continuing mission to provide you with exceptional heart care, we have created designated Provider Care Teams.  These Care Teams include your primary Cardiologist (physician) and Advanced Practice Providers (APPs -  Physician Assistants and Nurse Practitioners) who all work together to provide you with the care you need, when you need it.  We recommend signing up for the patient portal called "MyChart".  Sign up information is provided on this After Visit Summary.  MyChart is used to connect with patients for Virtual Visits (Telemedicine).  Patients are able to view lab/test results, encounter notes, upcoming appointments, etc.  Non-urgent messages can be sent to your provider as well.   To learn more about what you can do with MyChart, go to [email or web address].    Your next appointment:   1 year(s)  Provider:   Thurmon Fair, MD        Signed, Thurmon Fair, MD  01/24/2023 11:24 AM    [facility] [address] [place], [place], [place]  [address] Phone: [phone]; Fax: [phone]

## 2023-04-04 DIAGNOSIS — H353221 Exudative age-related macular degeneration, left eye, with active choroidal neovascularization: Secondary | ICD-10-CM | POA: Diagnosis not present

## 2023-04-04 DIAGNOSIS — H353112 Nonexudative age-related macular degeneration, right eye, intermediate dry stage: Secondary | ICD-10-CM | POA: Diagnosis not present

## 2023-04-04 DIAGNOSIS — Z961 Presence of intraocular lens: Secondary | ICD-10-CM | POA: Diagnosis not present

## 2023-04-04 DIAGNOSIS — H5315 Visual distortions of shape and size: Secondary | ICD-10-CM | POA: Diagnosis not present

## 2023-04-04 DIAGNOSIS — H35363 Drusen (degenerative) of macula, bilateral: Secondary | ICD-10-CM | POA: Diagnosis not present

## 2023-04-04 DIAGNOSIS — H3122 Choroidal dystrophy (central areolar) (generalized) (peripapillary): Secondary | ICD-10-CM | POA: Diagnosis not present

## 2023-04-04 DIAGNOSIS — H35453 Secondary pigmentary degeneration, bilateral: Secondary | ICD-10-CM | POA: Diagnosis not present

## 2023-04-23 DIAGNOSIS — E538 Deficiency of other specified B group vitamins: Secondary | ICD-10-CM | POA: Diagnosis not present

## 2023-04-23 DIAGNOSIS — Z23 Encounter for immunization: Secondary | ICD-10-CM | POA: Diagnosis not present

## 2023-04-23 DIAGNOSIS — M810 Age-related osteoporosis without current pathological fracture: Secondary | ICD-10-CM | POA: Diagnosis not present

## 2023-04-23 DIAGNOSIS — N1831 Chronic kidney disease, stage 3a: Secondary | ICD-10-CM | POA: Diagnosis not present

## 2023-04-23 DIAGNOSIS — I472 Ventricular tachycardia, unspecified: Secondary | ICD-10-CM | POA: Diagnosis not present

## 2023-04-23 DIAGNOSIS — E559 Vitamin D deficiency, unspecified: Secondary | ICD-10-CM | POA: Diagnosis not present

## 2023-04-23 DIAGNOSIS — E039 Hypothyroidism, unspecified: Secondary | ICD-10-CM | POA: Diagnosis not present

## 2023-04-23 DIAGNOSIS — I480 Paroxysmal atrial fibrillation: Secondary | ICD-10-CM | POA: Diagnosis not present

## 2023-04-23 DIAGNOSIS — K219 Gastro-esophageal reflux disease without esophagitis: Secondary | ICD-10-CM | POA: Diagnosis not present

## 2023-04-23 DIAGNOSIS — D509 Iron deficiency anemia, unspecified: Secondary | ICD-10-CM | POA: Diagnosis not present

## 2023-04-25 ENCOUNTER — Other Ambulatory Visit: Payer: Self-pay | Admitting: Student

## 2023-04-25 DIAGNOSIS — C73 Malignant neoplasm of thyroid gland: Secondary | ICD-10-CM | POA: Diagnosis not present

## 2023-05-02 DIAGNOSIS — E890 Postprocedural hypothyroidism: Secondary | ICD-10-CM | POA: Diagnosis not present

## 2023-05-02 DIAGNOSIS — E039 Hypothyroidism, unspecified: Secondary | ICD-10-CM | POA: Diagnosis not present

## 2023-05-16 DIAGNOSIS — H353112 Nonexudative age-related macular degeneration, right eye, intermediate dry stage: Secondary | ICD-10-CM | POA: Diagnosis not present

## 2023-05-16 DIAGNOSIS — H353221 Exudative age-related macular degeneration, left eye, with active choroidal neovascularization: Secondary | ICD-10-CM | POA: Diagnosis not present

## 2023-05-16 DIAGNOSIS — H35363 Drusen (degenerative) of macula, bilateral: Secondary | ICD-10-CM | POA: Diagnosis not present

## 2023-05-22 DIAGNOSIS — Z853 Personal history of malignant neoplasm of breast: Secondary | ICD-10-CM | POA: Diagnosis not present

## 2023-05-22 DIAGNOSIS — Z08 Encounter for follow-up examination after completed treatment for malignant neoplasm: Secondary | ICD-10-CM | POA: Diagnosis not present

## 2023-05-29 ENCOUNTER — Encounter: Payer: Self-pay | Admitting: Hematology

## 2023-07-05 DIAGNOSIS — H353221 Exudative age-related macular degeneration, left eye, with active choroidal neovascularization: Secondary | ICD-10-CM | POA: Diagnosis not present

## 2023-07-30 DIAGNOSIS — E538 Deficiency of other specified B group vitamins: Secondary | ICD-10-CM | POA: Diagnosis not present

## 2023-07-30 DIAGNOSIS — D509 Iron deficiency anemia, unspecified: Secondary | ICD-10-CM | POA: Diagnosis not present

## 2023-08-02 DIAGNOSIS — H353221 Exudative age-related macular degeneration, left eye, with active choroidal neovascularization: Secondary | ICD-10-CM | POA: Diagnosis not present

## 2023-08-27 DIAGNOSIS — R300 Dysuria: Secondary | ICD-10-CM | POA: Diagnosis not present

## 2023-08-27 DIAGNOSIS — Z6823 Body mass index (BMI) 23.0-23.9, adult: Secondary | ICD-10-CM | POA: Diagnosis not present

## 2023-09-03 DIAGNOSIS — H353221 Exudative age-related macular degeneration, left eye, with active choroidal neovascularization: Secondary | ICD-10-CM | POA: Diagnosis not present

## 2023-10-02 DIAGNOSIS — H353221 Exudative age-related macular degeneration, left eye, with active choroidal neovascularization: Secondary | ICD-10-CM | POA: Diagnosis not present

## 2023-10-20 ENCOUNTER — Other Ambulatory Visit: Payer: Self-pay | Admitting: Cardiovascular Disease

## 2023-10-22 DIAGNOSIS — I480 Paroxysmal atrial fibrillation: Secondary | ICD-10-CM | POA: Diagnosis not present

## 2023-10-22 DIAGNOSIS — C73 Malignant neoplasm of thyroid gland: Secondary | ICD-10-CM | POA: Diagnosis not present

## 2023-10-22 DIAGNOSIS — E7800 Pure hypercholesterolemia, unspecified: Secondary | ICD-10-CM | POA: Diagnosis not present

## 2023-10-22 DIAGNOSIS — D509 Iron deficiency anemia, unspecified: Secondary | ICD-10-CM | POA: Diagnosis not present

## 2023-10-22 DIAGNOSIS — J3489 Other specified disorders of nose and nasal sinuses: Secondary | ICD-10-CM | POA: Diagnosis not present

## 2023-10-22 DIAGNOSIS — M810 Age-related osteoporosis without current pathological fracture: Secondary | ICD-10-CM | POA: Diagnosis not present

## 2023-10-22 DIAGNOSIS — K219 Gastro-esophageal reflux disease without esophagitis: Secondary | ICD-10-CM | POA: Diagnosis not present

## 2023-10-22 DIAGNOSIS — E890 Postprocedural hypothyroidism: Secondary | ICD-10-CM | POA: Diagnosis not present

## 2023-10-22 DIAGNOSIS — N1831 Chronic kidney disease, stage 3a: Secondary | ICD-10-CM | POA: Diagnosis not present

## 2023-10-30 DIAGNOSIS — E039 Hypothyroidism, unspecified: Secondary | ICD-10-CM | POA: Diagnosis not present

## 2023-10-31 DIAGNOSIS — H353221 Exudative age-related macular degeneration, left eye, with active choroidal neovascularization: Secondary | ICD-10-CM | POA: Diagnosis not present

## 2023-11-06 DIAGNOSIS — E890 Postprocedural hypothyroidism: Secondary | ICD-10-CM | POA: Diagnosis not present

## 2023-11-06 DIAGNOSIS — C73 Malignant neoplasm of thyroid gland: Secondary | ICD-10-CM | POA: Diagnosis not present

## 2023-11-06 DIAGNOSIS — I480 Paroxysmal atrial fibrillation: Secondary | ICD-10-CM | POA: Diagnosis not present

## 2023-11-26 DIAGNOSIS — E538 Deficiency of other specified B group vitamins: Secondary | ICD-10-CM | POA: Diagnosis not present

## 2023-11-26 DIAGNOSIS — D509 Iron deficiency anemia, unspecified: Secondary | ICD-10-CM | POA: Diagnosis not present

## 2023-11-26 DIAGNOSIS — Z6822 Body mass index (BMI) 22.0-22.9, adult: Secondary | ICD-10-CM | POA: Diagnosis not present

## 2023-11-26 DIAGNOSIS — Z0000 Encounter for general adult medical examination without abnormal findings: Secondary | ICD-10-CM | POA: Diagnosis not present

## 2023-11-26 DIAGNOSIS — Z1331 Encounter for screening for depression: Secondary | ICD-10-CM | POA: Diagnosis not present

## 2023-12-05 DIAGNOSIS — H353221 Exudative age-related macular degeneration, left eye, with active choroidal neovascularization: Secondary | ICD-10-CM | POA: Diagnosis not present

## 2024-01-01 ENCOUNTER — Telehealth: Payer: Self-pay | Admitting: Nurse Practitioner

## 2024-01-01 NOTE — Telephone Encounter (Signed)
 Called to speak with her regarding her  question she had about  her upcoming appointment

## 2024-01-04 ENCOUNTER — Other Ambulatory Visit: Payer: Self-pay | Admitting: Cardiovascular Disease

## 2024-01-09 DIAGNOSIS — H353221 Exudative age-related macular degeneration, left eye, with active choroidal neovascularization: Secondary | ICD-10-CM | POA: Diagnosis not present

## 2024-01-14 ENCOUNTER — Other Ambulatory Visit: Payer: Self-pay | Admitting: Nurse Practitioner

## 2024-01-14 DIAGNOSIS — D0512 Intraductal carcinoma in situ of left breast: Secondary | ICD-10-CM

## 2024-01-14 NOTE — Assessment & Plan Note (Addendum)
 grade 3, ER weakly+/PR- -found on screening mammogram -s/p lumpectomy on 06/06/2022, margins were negative. -Given her advanced age, ER 10% weakly positive, PR negative, adjuvant antiestrogen therapy was not recommended. -Jill Mcdowell is clinically doing well. Exam shows known right breast cyst (bx 09/2012), otherwise unremarkable. No clinical concern for recurrence.  -Most recent screening mammogram done on 05/22/2023 showing no evidence of malignancy.  She is already scheduled for new mammogram in November 2025. -F/up in 1 year, or sooner if needed

## 2024-01-14 NOTE — Progress Notes (Signed)
 Patient Care Team: Rankins, Richerd SAUNDERS, MD as PCP - General (Family Medicine) Croitoru, Jerel, MD as PCP - Cardiology (Cardiology) Livingston Rigg, MD as Consulting Physician (Dermatology) Rigg Stephane BROCKS, RN (Inactive) as Oncology Nurse Navigator Tyree Nanetta SAILOR, RN as Oncology Nurse Navigator Lanny Callander, MD as Consulting Physician (Hematology) Dewey Rush, MD as Consulting Physician (Radiation Oncology) Vanderbilt Ned, MD as Consulting Physician (General Surgery)  Clinic Day:  01/15/2024  Referring physician: Loretha Richerd SAUNDERS, MD  ASSESSMENT & PLAN:   Assessment & Plan: 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 -Given her advanced age, ER 10% weakly positive, PR negative, adjuvant antiestrogen therapy was not recommended. -Ms. Crabtree is clinically doing well. Exam shows known right breast cyst (bx 09/2012), otherwise unremarkable. No clinical concern for recurrence.  -Mammogram scheduled in October at [facility]. Continue breast cancer surveillance -F/up in 1 year, or sooner if needed   Plan Labs reviewed. -CBC and CMP are unremarkable. Reviewed most recent mammogram from 05/22/2023 from [facility].  There is no evidence of malignancy.  Recall in 1 year.  Patient reports she is already scheduled for November 2025. Continue breast cancer surveillance. Labs and follow-up in 1 year, sooner if needed.  The patient understands the plans discussed today and is in agreement with them.  She knows to contact our office if she develops concerns prior to her next appointment.  I provided 25 minutes of face-to-face time during this encounter and > 50% was spent counseling as documented under my assessment and plan.    Powell FORBES Lessen, NP  [facility] - A DEPT OF [facility] [address] [place]  [address] Dept: [phone] Dept Fax: [phone]   No orders of the defined types were placed in this encounter.     CHIEF COMPLAINT:  CC: DCIS of left breast  Current Treatment: Surveillance  INTERVAL HISTORY:  Jill Mcdowell is here today for repeat clinical assessment.  She last saw Lacie, NP,  on 01/15/2023.  She had screening MRI on 05/22/2023.  Breast density was category B.  There is no evidence of malignancy.  Repeat expected in November 2025.  She is already scheduled for that screening test.  She reports no new changes or lumps in either breast.  She does perform regular self breast exams.  She denies chest pain, chest pressure, or shortness of breath. She denies headaches or visual disturbances. She denies abdominal pain, nausea, vomiting, or changes in bowel or bladder habits.  She denies fevers or chills. She denies pain. Her appetite is good. Her weight has decreased 3 pounds over last year.  I have reviewed the past medical history, past surgical history, social history and family history with the patient and they are unchanged from previous note.  ALLERGIES:  is allergic to nebivolol  hcl, phenothiazines, pneumococcal vac polyvalent, sulfamethoxazole-trimethoprim, wound dressing adhesive, latex, metoprolol , and sulfa antibiotics.  MEDICATIONS:  Current Outpatient Medications  Medication Sig Dispense Refill   acetaminophen  (TYLENOL ) 500 MG tablet Take 500 mg by mouth every 8 (eight) hours as needed for headache.      Brimonidine  Tartrate (LUMIFY ) 0.025 % SOLN Place 1 drop into both eyes daily.     calcium  carbonate (TUMS) 500 MG chewable tablet Chew 3 tablets by mouth 3 (three) times daily.     Cholecalciferol (VITAMIN D-3 PO) Take 10,000 Units by  mouth every Friday.      diltiazem  (CARDIZEM ) 30 MG tablet Take 1 tablet (30 mg total) by mouth daily as needed. 30 tablet 3   diltiazem  (DILT-XR) 180 MG 24 hr capsule Take 1 capsule (180 mg total) by mouth daily. Please keep scheduled  appointment for future refills. Thank you. 30 capsule 0   iron  polysaccharides (NIFEREX) 150 MG capsule TAKE 1 CAPSULE (150 MG TOTAL) BY MOUTH EVERY MONDAY, WEDNESDAY, AND FRIDAY. 36 capsule 3   levothyroxine  (SYNTHROID ) 88 MCG tablet Take 88 mcg by mouth daily before breakfast.     Multiple Vitamins-Minerals (PRESERVISION AREDS PO) Take 1 capsule by mouth 2 (two) times daily.      omeprazole (PRILOSEC) 20 MG capsule Take 20 mg by mouth daily with lunch.      polyethylene glycol powder (GLYCOLAX /MIRALAX ) 17 GM/SCOOP powder Take 17 g by mouth daily.     No current facility-administered medications for this visit.    HISTORY OF PRESENT ILLNESS:   Oncology History  Ductal carcinoma in situ (DCIS) of left breast  05/03/2022 Cancer Staging   Staging form: Breast, AJCC 8th Edition - Clinical stage from 05/03/2022: Stage 0 (cTis (DCIS), cN0, cM0, G3, ER+, PR-, HER2: Not Assessed) - Signed by Lanny Callander, MD on 05/09/2022 Stage prefix: Initial diagnosis Histologic grading system: 3 grade system   05/03/2022 Initial Biopsy   Diagnosis Breast, left, needle core biopsy, UOQ - DUCTAL CARCINOMA IN SITU (DCIS), GRADE 2-3 (INTERMEDIATE-HIGH). - MICROCALCIFICATIONS PRESENT.  PROGNOSTIC INDICATORS Results: IMMUNOHISTOCHEMICAL AND MORPHOMETRIC ANALYSIS PERFORMED MANUALLY Estrogen Receptor: 10%, POSITIVE, WEAK STAINING INTENSITY Progesterone Receptor: 0%, NEGATIVE COMMENT: The negative hormone receptor study(ies) in this case has an internal positive control. The cancer in this sample has a low level (1%-10%) of ER expression by IHC. There are limited data on the overall benefit of endocrine therapies for patients with low level (1%-10%) ER expression, but they currently suggest possible benefit, so patients are considered eligible for endocrine treatment. There are data that suggest invasive cancers with these results are heterogeneous in both behavior and biology and often have gene expression profiles more  similar to ER-negative cancers.   05/08/2022 Initial Diagnosis   Ductal carcinoma in situ (DCIS) of left breast    Genetic Testing   [facility] CancerNext Panel+RNA was Negative. Of note, a variant of uncertain significance was detected in the CDH1 gene (p.[id number]). Report date is 06/01/2022.  The CancerNext gene panel offered by [employer] includes sequencing, rearrangement analysis, and RNA analysis for the following 36 genes:   APC, ATM, AXIN2, BARD1, BMPR1A, BRCA1, BRCA2, BRIP1, CDH1, CDK4, CDKN2A, CHEK2, DICER1, HOXB13, EPCAM, GREM1, MLH1, MSH2, MSH3, MSH6, MUTYH, NBN, NF1, NTHL1, PALB2, PMS2, POLD1, POLE, PTEN, RAD51C, RAD51D, RECQL, SMAD4, SMARCA4, STK11, and TP53.    06/06/2022 Cancer Staging   Staging form: Breast, AJCC 8th Edition - Pathologic stage from 06/06/2022: Stage Unknown (pTis (DCIS), pNX, cM0, G3, ER+, PR-, HER2: Not Assessed) - Signed by Lanny Callander, MD on 06/26/2022 Stage prefix: Initial diagnosis Histologic grading system: 3 grade system Residual tumor (R): R0 - None       REVIEW OF SYSTEMS:   Constitutional: Denies fevers, chills or abnormal weight loss Eyes: Denies blurriness of vision Ears, nose, mouth, throat, and face: Denies mucositis or sore throat Respiratory: Denies cough, dyspnea or wheezes Cardiovascular: Denies palpitation, chest discomfort or lower extremity swelling Gastrointestinal:  Denies nausea, heartburn or change in bowel habits Skin: Denies abnormal skin rashes Lymphatics: Denies new lymphadenopathy or easy bruising Neurological:Denies  numbness, tingling or new weaknesses Behavioral/Psych: Mood is stable, no new changes  All other systems were reviewed with the patient and are negative.   VITALS:   Today's Vitals   01/15/24 0943 01/15/24 1015  BP: 114/70   Pulse: 86   Resp: 17   Temp: 98 F (36.7 C)   SpO2: 96%   Weight: 111 lb 6.4 oz (50.5 kg)   PainSc:  0-No pain   Body mass index is 22.5 kg/m.   Wt Readings from Last 3  Encounters:  01/15/24 111 lb 6.4 oz (50.5 kg)  01/24/23 114 lb 6.4 oz (51.9 kg)  01/15/23 113 lb 9.6 oz (51.5 kg)    Body mass index is 22.5 kg/m.  Performance status (ECOG): 0 - Asymptomatic  PHYSICAL EXAM:   GENERAL:alert, no distress and comfortable SKIN: skin color, texture, turgor are normal, no rashes or significant lesions EYES: normal, Conjunctiva are pink and non-injected, sclera clear OROPHARYNX:no exudate, no erythema and lips, buccal mucosa, and tongue normal  NECK: supple, thyroid  normal size, non-tender, without nodularity LYMPH:  no palpable lymphadenopathy in the cervical, axillary or inguinal LUNGS: clear to auscultation and percussion with normal breathing effort HEART: regular rate & rhythm and no murmurs and no lower extremity edema ABDOMEN:abdomen soft, non-tender and normal bowel sounds Musculoskeletal:no cyanosis of digits and no clubbing  NEURO: alert & oriented x 3 with fluent speech, no focal motor/sensory deficits BREAST: She has well-healed lumpectomy scars in the left breast.  No new palpable lumps or masses.  There is no nipple discharge or nipple inversion.  There is no axillary lymphadenopathy on the left.  The right breast does have palpable mass in the lower outer quadrant of the breast.  Per patient, this is unchanged over many years.  There are no other palpable masses or lumps in the right breast.  There is no nipple inversion or nipple discharge.  There is no axillary lymphadenopathy on the right.   LABORATORY DATA:  I have reviewed the data as listed    Component Value Date/Time   NA 142 01/15/2024 0933   K 4.0 01/15/2024 0933   CL 107 01/15/2024 0933   CO2 30 01/15/2024 0933   GLUCOSE 94 01/15/2024 0933   BUN 12 01/15/2024 0933   CREATININE 0.82 01/15/2024 0933   CALCIUM  8.7 (L) 01/15/2024 0933   PROT 6.5 01/15/2024 0933   ALBUMIN 4.0 01/15/2024 0933   AST 16 01/15/2024 0933   ALT 10 01/15/2024 0933   ALKPHOS 65 01/15/2024 0933    BILITOT 1.6 (H) 01/15/2024 0933   GFRNONAA >60 01/15/2024 0933   GFRAA >60 12/17/2018 0715    Lab Results  Component Value Date   WBC 7.3 01/15/2024   NEUTROABS 4.9 01/15/2024   HGB 14.7 01/15/2024   HCT 44.7 01/15/2024   MCV 90.5 01/15/2024   PLT 152 01/15/2024

## 2024-01-15 ENCOUNTER — Inpatient Hospital Stay: Payer: Medicare HMO | Attending: Nurse Practitioner

## 2024-01-15 ENCOUNTER — Inpatient Hospital Stay (HOSPITAL_BASED_OUTPATIENT_CLINIC_OR_DEPARTMENT_OTHER): Payer: Medicare HMO | Admitting: Nurse Practitioner

## 2024-01-15 ENCOUNTER — Other Ambulatory Visit: Payer: Self-pay

## 2024-01-15 VITALS — BP 114/70 | HR 86 | Temp 98.0°F | Resp 17 | Wt 111.4 lb

## 2024-01-15 DIAGNOSIS — Z79899 Other long term (current) drug therapy: Secondary | ICD-10-CM | POA: Diagnosis not present

## 2024-01-15 DIAGNOSIS — Z170 Estrogen receptor positive status [ER+]: Secondary | ICD-10-CM | POA: Diagnosis not present

## 2024-01-15 DIAGNOSIS — D0512 Intraductal carcinoma in situ of left breast: Secondary | ICD-10-CM

## 2024-01-15 DIAGNOSIS — Z1722 Progesterone receptor negative status: Secondary | ICD-10-CM | POA: Diagnosis not present

## 2024-01-15 LAB — CBC WITH DIFFERENTIAL (CANCER CENTER ONLY)
Abs Immature Granulocytes: 0.01 K/uL (ref 0.00–0.07)
Basophils Absolute: 0.1 K/uL (ref 0.0–0.1)
Basophils Relative: 1 %
Eosinophils Absolute: 0.2 K/uL (ref 0.0–0.5)
Eosinophils Relative: 2 %
HCT: 44.7 % (ref 36.0–46.0)
Hemoglobin: 14.7 g/dL (ref 12.0–15.0)
Immature Granulocytes: 0 %
Lymphocytes Relative: 22 %
Lymphs Abs: 1.6 K/uL (ref 0.7–4.0)
MCH: 29.8 pg (ref 26.0–34.0)
MCHC: 32.9 g/dL (ref 30.0–36.0)
MCV: 90.5 fL (ref 80.0–100.0)
Monocytes Absolute: 0.6 K/uL (ref 0.1–1.0)
Monocytes Relative: 9 %
Neutro Abs: 4.9 K/uL (ref 1.7–7.7)
Neutrophils Relative %: 66 %
Platelet Count: 152 K/uL (ref 150–400)
RBC: 4.94 MIL/uL (ref 3.87–5.11)
RDW: 13.5 % (ref 11.5–15.5)
WBC Count: 7.3 K/uL (ref 4.0–10.5)
nRBC: 0 % (ref 0.0–0.2)

## 2024-01-15 LAB — CMP (CANCER CENTER ONLY)
ALT: 10 U/L (ref 0–44)
AST: 16 U/L (ref 15–41)
Albumin: 4 g/dL (ref 3.5–5.0)
Alkaline Phosphatase: 65 U/L (ref 38–126)
Anion gap: 5 (ref 5–15)
BUN: 12 mg/dL (ref 8–23)
CO2: 30 mmol/L (ref 22–32)
Calcium: 8.7 mg/dL — ABNORMAL LOW (ref 8.9–10.3)
Chloride: 107 mmol/L (ref 98–111)
Creatinine: 0.82 mg/dL (ref 0.44–1.00)
GFR, Estimated: >60 mL/min (ref >60)
Glucose, Bld: 94 mg/dL (ref 70–99)
Potassium: 4 mmol/L (ref 3.5–5.1)
Sodium: 142 mmol/L (ref 135–145)
Total Bilirubin: 1.6 mg/dL — ABNORMAL HIGH (ref 0.0–1.2)
Total Protein: 6.5 g/dL (ref 6.5–8.1)

## 2024-01-20 ENCOUNTER — Encounter: Payer: Self-pay | Admitting: Nurse Practitioner

## 2024-01-28 ENCOUNTER — Other Ambulatory Visit: Payer: Self-pay | Admitting: Cardiovascular Disease

## 2024-01-30 ENCOUNTER — Other Ambulatory Visit: Payer: Self-pay | Admitting: Cardiovascular Disease

## 2024-02-12 ENCOUNTER — Ambulatory Visit: Attending: Cardiovascular Disease | Admitting: Cardiovascular Disease

## 2024-02-12 VITALS — BP 122/64 | HR 82 | Ht 59.0 in | Wt 112.4 lb

## 2024-02-12 DIAGNOSIS — I480 Paroxysmal atrial fibrillation: Secondary | ICD-10-CM

## 2024-02-12 DIAGNOSIS — E039 Hypothyroidism, unspecified: Secondary | ICD-10-CM

## 2024-02-12 DIAGNOSIS — H353130 Nonexudative age-related macular degeneration, bilateral, stage unspecified: Secondary | ICD-10-CM | POA: Diagnosis not present

## 2024-02-12 DIAGNOSIS — I493 Ventricular premature depolarization: Secondary | ICD-10-CM

## 2024-02-12 MED ORDER — DILTIAZEM HCL 30 MG PO TABS: 30.0000 mg | ORAL_TABLET | Freq: Every day | ORAL | 3 refills | Status: AC | PRN

## 2024-02-12 MED ORDER — DILTIAZEM HCL ER 180 MG PO CP24: 180.0000 mg | ORAL_CAPSULE | Freq: Every day | ORAL | 3 refills | Status: AC

## 2024-02-12 NOTE — Patient Instructions (Signed)
 Medication Instructions:  Continue same medications *If you need a refill on your cardiac medications before your next appointment, please call your pharmacy*  Lab Work: None ordered  Testing/Procedures: None ordered  Follow-Up: At [facility], you and your health needs are our priority.  As part of our continuing mission to provide you with exceptional heart care, our providers are all part of one team.  This team includes your primary Cardiologist (physician) and Advanced Practice Providers or APPs (Physician Assistants and Nurse Practitioners) who all work together to provide you with the care you need, when you need it.  Your next appointment:  1 year   Call in April to schedule August appointment     Provider:  Dr.Croitoru   We recommend signing up for the patient portal called MyChart.  Sign up information is provided on this After Visit Summary.  MyChart is used to connect with patients for Virtual Visits (Telemedicine).  Patients are able to view lab/test results, encounter notes, upcoming appointments, etc.  Non-urgent messages can be sent to your provider as well.   To learn more about what you can do with MyChart, go to [email or web address].

## 2024-02-13 DIAGNOSIS — H353221 Exudative age-related macular degeneration, left eye, with active choroidal neovascularization: Secondary | ICD-10-CM | POA: Diagnosis not present

## 2024-02-14 ENCOUNTER — Encounter: Payer: Self-pay | Admitting: Cardiovascular Disease

## 2024-02-14 NOTE — Progress Notes (Signed)
 Patient had [facility] with cardiology   Cardiology Office Note    Date:  02/14/2024   ID:  Jill Mcdowell, Jill Mcdowell [birth year], MRN [id number]  PCP:  Jill Richerd SAUNDERS, MD  Cardiologist:   Jill Balding, MD   Chief Complaint  Patient presents with   Irregular Heart Beat    History of Present Illness:  Jill Mcdowell is a 89 y.o. female a remote history of a single episode of atrial fibrillation (reportedly occurred in the 1990s, no firm documentation available).   It's been 5 years since her husband Jill Mcdowell passed away.  She continues to live at [employer].  She is here today with her daughter, Jill Mcdowell.  In the last 12 months she has had only 2 episodes of rapid palpitations.  One was too brief to worry about.  The other 1 lasted for about 30 minutes and resolved after she took diltiazem .  Her heart rate was about 140 bpm.  The onset and the termination were both very abrupt. She has not had any problems with excessive lower extremity edema, skin ulcerations, orthopnea, PND or any chest pain.  She denies syncope or dizziness.  She has not had any falls.  In the past, her palpitations seem to be associated with monomorphic PVCs.  These always had a right bundle branch block pattern.  She did not tolerate beta-blockers due to visual complaints.  She had hallucinations consistent with Carlin Nutting syndrome (she would sometimes see a small female figure and other times a stereotypical geometrical pattern).  She is very allergic to adhesives.  Past attempts at getting rhythm monitor tracings were always impeded by blistering rashes.  This also happened when she received a lidocaine  patch after back surgery.  She does not have known CAD.  She had a normal nuclear scan in 2009.  Past Medical History:  Diagnosis Date   Anemia    Arrhythmia 02/06/2012   hx of paroxysmal a fib with no recurrence,rare palpations   Arthritis    Breast lump    Cervical cancer (HCC)    Dysrhythmia    GERD  (gastroesophageal reflux disease)    History of hiatal hernia    History of kidney stones    Hx of migraine headaches    Macular degeneration    L eye   SCC (squamous cell carcinoma) 2013   right temple tx cx3 9fu   SCC (squamous cell carcinoma) 2014   right upper shin scc/ka tx cx3 48fu & Exc   Squamous cell carcinoma of skin 1999   left neck scc tx cx3 79fu   Thyroid  disease     Past Surgical History:  Procedure Laterality Date   ABDOMINAL HYSTERECTOMY     BREAST LUMPECTOMY WITH RADIOACTIVE SEED LOCALIZATION Left 06/06/2022   Procedure: LEFT BREAST BRACKETED LUMPECTOMY WITH RADIOACTIVE SEED LOCALIZATION;  Surgeon: Vanderbilt Ned, MD;  Location:  SURGERY CENTER;  Service: General;  Laterality: Left;   CHOLECYSTECTOMY     KYPHOPLASTY N/A 12/18/2018   Procedure: KYPHOPLASTY, THORACIC SEVEN;  Surgeon: Mavis Purchase, MD;  Location: [facility] OR;  Service: Neurosurgery;  Laterality: N/A;  KYPHOPLASTY, THORACIC 7   NM MYOCAR PERF WALL MOTION  02/19/2008   negative for ischemia   SUPRAVENTRICULAR TACHYCARDIA ABLATION     THYROIDECTOMY N/A 05/16/2021   Procedure: TOTAL THYROIDECTOMY;  Surgeon: Eletha Boas, MD;  Location: [facility];  Service: General;  Laterality: N/A;    Current Medications: Outpatient Medications Prior to Visit  Medication Sig Dispense Refill  acetaminophen  (TYLENOL ) 500 MG tablet Take 500 mg by mouth every 8 (eight) hours as needed for headache.      calcium  carbonate (TUMS) 500 MG chewable tablet Chew 3 tablets by mouth 3 (three) times daily.     cholecalciferol (VITAMIN D3) 25 MCG (1000 UNIT) tablet Take 1000 international units 3 times a week     iron  polysaccharides (NIFEREX) 150 MG capsule TAKE 1 CAPSULE (150 MG TOTAL) BY MOUTH EVERY MONDAY, WEDNESDAY, AND FRIDAY. 36 capsule 3   levothyroxine  (SYNTHROID ) 88 MCG tablet Take 88 mcg by mouth daily before breakfast.     Multiple Vitamins-Minerals (PRESERVISION AREDS PO) Take 1 capsule by mouth 2 (two) times  daily.      omeprazole (PRILOSEC) 20 MG capsule Take 20 mg by mouth daily with lunch.      polyethylene glycol powder (GLYCOLAX /MIRALAX ) 17 GM/SCOOP powder Take 17 g by mouth daily.     diltiazem  (CARDIZEM ) 30 MG tablet Take 1 tablet (30 mg total) by mouth daily as needed. 30 tablet 3   diltiazem  (DILT-XR) 180 MG 24 hr capsule Take 1 capsule (180 mg total) by mouth daily. Please keep scheduled appointment for future refills. Thank you. 30 capsule 0   Brimonidine  Tartrate (LUMIFY ) 0.025 % SOLN Place 1 drop into both eyes daily. (Patient not taking: Reported on 02/12/2024)     Cholecalciferol (VITAMIN D-3 PO) Take 10,000 Units by mouth every Friday.  (Patient not taking: Reported on 02/12/2024)     No facility-administered medications prior to visit.     Allergies:   Nebivolol  hcl, Phenothiazines, Pneumococcal vac polyvalent, Sulfamethoxazole-trimethoprim, Wound dressing adhesive, Latex, Metoprolol , and Sulfa antibiotics   Social History   Socioeconomic History   Marital status: Married    Spouse name: Not on file   Number of children: 2   Years of education: Not on file   Highest education level: Not on file  Occupational History   Not on file  Tobacco Use   Smoking status: Former    Current packs/day: 0.00    Average packs/day: 0.5 packs/day for 40.0 years (20.0 ttl pk-yrs)    Types: Cigarettes    Start date: 33    Quit date: 6    Years since quitting: 35.6   Smokeless tobacco: Never  Vaping Use   Vaping status: Never Used  Substance and Sexual Activity   Alcohol use: No    Alcohol/week: 0.0 standard drinks of alcohol   Drug use: No   Sexual activity: Not on file  Other Topics Concern   Not on file  Social History Narrative   Not on file   Social Drivers of Health   Financial Resource Strain: Not on file  Food Insecurity: Not on file  Transportation Needs: Not on file  Physical Activity: Not on file  Stress: Not on file  Social Connections: Not on file      Family History:  The patient's family history includes Colon cancer (age of onset: 52) in her brother; Lung cancer in her brother; Ovarian cancer in her sister; Pancreatic cancer in her mother; Stroke in her father.   ROS:   Please see the history of present illness.    ROS All other systems are reviewed and are negative.  PHYSICAL EXAM:   VS:  BP 122/64 (BP Location: Right Arm, Cuff Size: Normal)   Pulse 82   Ht 4' 11 (1.499 m)   Wt 112 lb 7 oz (51 kg)   BMI 22.71 kg/m  General: Alert, oriented x3, no distress, very lean and petite Head: no evidence of trauma, PERRL, EOMI, no exophtalmos or lid lag, no myxedema, no xanthelasma; normal ears, nose and oropharynx Neck: normal jugular venous pulsations and no hepatojugular reflux; brisk carotid pulses without delay and no carotid bruits Chest: clear to auscultation, no signs of consolidation by percussion or palpation, normal fremitus, symmetrical and full respiratory excursions Cardiovascular: normal position and quality of the apical impulse, regular rhythm, normal first and second heart sounds, no murmurs, rubs or gallops Abdomen: no tenderness or distention, no masses by palpation, no abnormal pulsatility or arterial bruits, normal bowel sounds, no hepatosplenomegaly Extremities: no clubbing, cyanosis or edema; 2+ radial, ulnar and brachial pulses bilaterally; 2+ right femoral, posterior tibial and dorsalis pedis pulses; 2+ left femoral, posterior tibial and dorsalis pedis pulses; no subclavian or femoral bruits Neurological: grossly nonfocal Psych: Normal mood and affect    Wt Readings from Last 3 Encounters:  02/12/24 112 lb 7 oz (51 kg)  01/15/24 111 lb 6.4 oz (50.5 kg)  01/24/23 114 lb 6.4 oz (51.9 kg)      Studies/Labs Reviewed:   EKG:    EKG Interpretation Date/Time:  Tuesday February 12 2024 09:21:50 EDT Ventricular Rate:  82 PR Interval:  150 QRS Duration:  76 QT Interval:  398 QTC Calculation: 464 R  Axis:   7  Text Interpretation: Sinus rhythm with occasional Premature ventricular complexes Minimal voltage criteria for LVH, may be normal variant ( R in aVL ) When compared with ECG of 15-Dec-2018 10:31, QT has shortened Premature ventricular complexes are now present Confirmed by Katianne Barre [phone]) on 02/12/2024 9:38:01 AM         Recent Labs: October 23, 2017 hemoglobin 14, normal liver function tests, TSH 0.76 Total cholesterol 206, HDL 57, LDL 124, triglycerides 123. 06/18/2019 Hemoglobin 14.1, normal liver function tests, TSH 1.14 Total cholesterol 177, HDL 61, LDL 107, triglycerides 102 05/27/2020 Cholesterol 198, HDL 65, LDL 117, triglycerides 91 Hemoglobin 15.4, creatinine 0.86, potassium 4.1, normal liver function tests, TSH 1.21 11/07/2021 Cholesterol 207, HDL 60, LDL 125, triglycerides 128 Creatinine 0.9, potassium 4.5, ALT 11, TSH 3.25 10/05/2022 Hemoglobin 14.9, creatinine 0.7, potassium 4.4, TSH 4.07  10/30/2023 TSH 0.669  01/15/2024 Hemoglobin 14.7, creatinine 0.82, potassium 4.0, ALT 10  ASSESSMENT:    1. Paroxysmal atrial fibrillation (HCC)   2. PVC's (premature ventricular contractions)   3. Acquired hypothyroidism      PLAN:  In order of problems listed above:  Rapid palpitations: While we cannot completely exclude atrial fibrillation, the behavior of the arrhythmia was more in keeping with an episode of SVT, with regular rapid palpitations and very abrupt onset and termination, prompt response to treatment with diltiazem .  Her blood pressure has been relatively low and really did not allow daily treatment with medications.  Will continue the current strategy with as needed diltiazem . PAFib: Questionable diagnosis.  We have never had firm documentation of this.  Somebody put this diagnosis in her chart over 20 years ago but I was never able to find any ECG tracing that confirmed this.  It would be important to exclude atrial fibrillation in this  89 year old, but previous attempts at long-term arrhythmia recording have failed since she promptly develops a blistering rash in response to all the ECG of the symptoms.  She does not have a smart phone or tablet.  Her granddaughter does have a smart watch with ECG capabilities.  I have asked her to use it when  Jill Mcdowell has palpitations and send us  a copy of the tracings. PVCs: Having some ectopic beats in clinic today, but she is unaware of them at this time.  On the limited event monitoring that we have obtained in the past, her palpitations correlated with monomorphic PVCs.  She did not tolerate beta-blockers.  Diltiazem  appeared to offer some benefit symptomatic benefit.  Her blood pressure is relatively low and she may not tolerate higher daily doses of diltiazem , but I gave her a prescription for as needed diltiazem  to take for periods of increased palpitations.   Macular degeneration: In response to beta-blocker she developed Carlin Nutting hallucinations related to macular degeneration;  retina specialist is Dr. Tobie.  She is getting Eylea  injections.  There does not appear to be any association between the injections and her episodes of palpitations. Thoracic kyphosis/osteoporosis:   Has history of wedge compression fracture.   Hypothyroidism: Thyroid  clinically.  Recent normal TSH.    Medication Adjustments/Labs and Tests Ordered: Current medicines are reviewed at length with the patient today.  Concerns regarding medicines are outlined above.  Medication changes, Labs and Tests ordered today are listed in the Patient Instructions below. Patient Instructions  Medication Instructions:  Continue same medications *If you need a refill on your cardiac medications before your next appointment, please call your pharmacy*  Lab Work: None ordered  Testing/Procedures: None ordered  Follow-Up: At [facility], you and your health needs are our priority.  As part of our continuing  mission to provide you with exceptional heart care, our providers are all part of one team.  This team includes your primary Cardiologist (physician) and Advanced Practice Providers or APPs (Physician Assistants and Nurse Practitioners) who all work together to provide you with the care you need, when you need it.  Your next appointment:  1 year   Call in April to schedule August appointment    Provider:  Dr.Demeshia Sherburne   We recommend signing up for the patient portal called MyChart.  Sign up information is provided on this After Visit Summary.  MyChart is used to connect with patients for Virtual Visits (Telemedicine).  Patients are able to view lab/test results, encounter notes, upcoming appointments, etc.  Non-urgent messages can be sent to your provider as well.   To learn more about what you can do with MyChart, go to [email or web address].           Signed, Jill Balding, MD  02/14/2024 5:20 PM    [facility] [address] [place], [place], [place]  [address] Phone: [phone]; Fax: [phone]

## 2024-02-28 DIAGNOSIS — H52203 Unspecified astigmatism, bilateral: Secondary | ICD-10-CM | POA: Diagnosis not present

## 2024-02-28 DIAGNOSIS — H26492 Other secondary cataract, left eye: Secondary | ICD-10-CM | POA: Diagnosis not present

## 2024-02-28 DIAGNOSIS — H353132 Nonexudative age-related macular degeneration, bilateral, intermediate dry stage: Secondary | ICD-10-CM | POA: Diagnosis not present

## 2024-02-28 DIAGNOSIS — Z961 Presence of intraocular lens: Secondary | ICD-10-CM | POA: Diagnosis not present

## 2024-03-19 DIAGNOSIS — H353221 Exudative age-related macular degeneration, left eye, with active choroidal neovascularization: Secondary | ICD-10-CM | POA: Diagnosis not present

## 2024-03-19 DIAGNOSIS — H04121 Dry eye syndrome of right lacrimal gland: Secondary | ICD-10-CM | POA: Diagnosis not present

## 2024-03-19 DIAGNOSIS — H35361 Drusen (degenerative) of macula, right eye: Secondary | ICD-10-CM | POA: Diagnosis not present

## 2024-03-19 DIAGNOSIS — H353112 Nonexudative age-related macular degeneration, right eye, intermediate dry stage: Secondary | ICD-10-CM | POA: Diagnosis not present

## 2024-04-02 DIAGNOSIS — U071 COVID-19: Secondary | ICD-10-CM | POA: Diagnosis not present

## 2024-04-02 DIAGNOSIS — Z6823 Body mass index (BMI) 23.0-23.9, adult: Secondary | ICD-10-CM | POA: Diagnosis not present

## 2024-04-02 DIAGNOSIS — R051 Acute cough: Secondary | ICD-10-CM | POA: Diagnosis not present

## 2024-04-23 DIAGNOSIS — H353221 Exudative age-related macular degeneration, left eye, with active choroidal neovascularization: Secondary | ICD-10-CM | POA: Diagnosis not present

## 2024-04-24 DIAGNOSIS — E7800 Pure hypercholesterolemia, unspecified: Secondary | ICD-10-CM | POA: Diagnosis not present

## 2024-04-24 DIAGNOSIS — K219 Gastro-esophageal reflux disease without esophagitis: Secondary | ICD-10-CM | POA: Diagnosis not present

## 2024-04-24 DIAGNOSIS — H6121 Impacted cerumen, right ear: Secondary | ICD-10-CM | POA: Diagnosis not present

## 2024-04-24 DIAGNOSIS — M810 Age-related osteoporosis without current pathological fracture: Secondary | ICD-10-CM | POA: Diagnosis not present

## 2024-04-24 DIAGNOSIS — E890 Postprocedural hypothyroidism: Secondary | ICD-10-CM | POA: Diagnosis not present

## 2024-04-24 DIAGNOSIS — C50912 Malignant neoplasm of unspecified site of left female breast: Secondary | ICD-10-CM | POA: Diagnosis not present

## 2024-04-24 DIAGNOSIS — R94120 Abnormal auditory function study: Secondary | ICD-10-CM | POA: Diagnosis not present

## 2024-04-24 DIAGNOSIS — D509 Iron deficiency anemia, unspecified: Secondary | ICD-10-CM | POA: Diagnosis not present

## 2024-04-24 DIAGNOSIS — C73 Malignant neoplasm of thyroid gland: Secondary | ICD-10-CM | POA: Diagnosis not present

## 2024-04-24 DIAGNOSIS — Z23 Encounter for immunization: Secondary | ICD-10-CM | POA: Diagnosis not present

## 2024-04-24 DIAGNOSIS — E538 Deficiency of other specified B group vitamins: Secondary | ICD-10-CM | POA: Diagnosis not present

## 2024-04-24 DIAGNOSIS — I493 Ventricular premature depolarization: Secondary | ICD-10-CM | POA: Diagnosis not present

## 2024-04-24 DIAGNOSIS — N1831 Chronic kidney disease, stage 3a: Secondary | ICD-10-CM | POA: Diagnosis not present

## 2024-05-08 DIAGNOSIS — E890 Postprocedural hypothyroidism: Secondary | ICD-10-CM | POA: Diagnosis not present

## 2024-05-28 DIAGNOSIS — H353221 Exudative age-related macular degeneration, left eye, with active choroidal neovascularization: Secondary | ICD-10-CM | POA: Diagnosis not present

## 2025-01-14 ENCOUNTER — Ambulatory Visit: Admitting: Nurse Practitioner

## 2025-01-14 ENCOUNTER — Other Ambulatory Visit
